# Patient Record
Sex: Female | Born: 1935 | Race: White | Hispanic: No | Marital: Single | Smoking: Never smoker
Health system: Southern US, Community
[De-identification: ages and names within clinical notes are randomized; demographics above are authoritative.]

## PROBLEM LIST (undated history)

## (undated) DIAGNOSIS — E039 Hypothyroidism, unspecified: Secondary | ICD-10-CM

## (undated) DIAGNOSIS — G8929 Other chronic pain: Secondary | ICD-10-CM

## (undated) DIAGNOSIS — E119 Type 2 diabetes mellitus without complications: Secondary | ICD-10-CM

## (undated) DIAGNOSIS — M1990 Unspecified osteoarthritis, unspecified site: Secondary | ICD-10-CM

## (undated) DIAGNOSIS — R609 Edema, unspecified: Secondary | ICD-10-CM

## (undated) DIAGNOSIS — Z8619 Personal history of other infectious and parasitic diseases: Secondary | ICD-10-CM

## (undated) DIAGNOSIS — R350 Frequency of micturition: Secondary | ICD-10-CM

## (undated) DIAGNOSIS — M329 Systemic lupus erythematosus, unspecified: Secondary | ICD-10-CM

## (undated) DIAGNOSIS — Z8709 Personal history of other diseases of the respiratory system: Secondary | ICD-10-CM

## (undated) DIAGNOSIS — R351 Nocturia: Secondary | ICD-10-CM

## (undated) DIAGNOSIS — IMO0002 Reserved for concepts with insufficient information to code with codable children: Secondary | ICD-10-CM

## (undated) DIAGNOSIS — E785 Hyperlipidemia, unspecified: Secondary | ICD-10-CM

## (undated) DIAGNOSIS — H269 Unspecified cataract: Secondary | ICD-10-CM

## (undated) DIAGNOSIS — M2550 Pain in unspecified joint: Secondary | ICD-10-CM

## (undated) DIAGNOSIS — K219 Gastro-esophageal reflux disease without esophagitis: Secondary | ICD-10-CM

## (undated) DIAGNOSIS — K5900 Constipation, unspecified: Secondary | ICD-10-CM

## (undated) DIAGNOSIS — R600 Localized edema: Secondary | ICD-10-CM

## (undated) DIAGNOSIS — N390 Urinary tract infection, site not specified: Secondary | ICD-10-CM

## (undated) DIAGNOSIS — I10 Essential (primary) hypertension: Secondary | ICD-10-CM

## (undated) DIAGNOSIS — M549 Dorsalgia, unspecified: Secondary | ICD-10-CM

## (undated) HISTORY — PX: OTHER SURGICAL HISTORY: SHX169

## (undated) HISTORY — PX: ADENOIDECTOMY: SUR15

## (undated) HISTORY — PX: TONSILLECTOMY: SUR1361

## (undated) HISTORY — PX: CHOLECYSTECTOMY: SHX55

## (undated) HISTORY — PX: EYE SURGERY: SHX253

---

## 2014-04-07 ENCOUNTER — Encounter (HOSPITAL_COMMUNITY): Payer: Self-pay | Admitting: Emergency Medicine

## 2014-04-07 ENCOUNTER — Emergency Department (HOSPITAL_COMMUNITY): Payer: Worker's Compensation

## 2014-04-07 ENCOUNTER — Inpatient Hospital Stay (HOSPITAL_COMMUNITY)
Admission: EM | Admit: 2014-04-07 | Discharge: 2014-04-10 | DRG: 469 | Disposition: A | Discharge status: Rehabilitation Facility | Payer: Worker's Compensation | Attending: Internal Medicine | Admitting: Internal Medicine

## 2014-04-07 DIAGNOSIS — S72009A Fracture of unspecified part of neck of unspecified femur, initial encounter for closed fracture: Secondary | ICD-10-CM

## 2014-04-07 DIAGNOSIS — W010XXA Fall on same level from slipping, tripping and stumbling without subsequent striking against object, initial encounter: Secondary | ICD-10-CM | POA: Diagnosis present

## 2014-04-07 DIAGNOSIS — B965 Pseudomonas (aeruginosa) (mallei) (pseudomallei) as the cause of diseases classified elsewhere: Secondary | ICD-10-CM | POA: Diagnosis present

## 2014-04-07 DIAGNOSIS — S72033A Displaced midcervical fracture of unspecified femur, initial encounter for closed fracture: Principal | ICD-10-CM | POA: Diagnosis present

## 2014-04-07 DIAGNOSIS — I10 Essential (primary) hypertension: Secondary | ICD-10-CM

## 2014-04-07 DIAGNOSIS — D62 Acute posthemorrhagic anemia: Secondary | ICD-10-CM | POA: Diagnosis not present

## 2014-04-07 DIAGNOSIS — M1990 Unspecified osteoarthritis, unspecified site: Secondary | ICD-10-CM

## 2014-04-07 DIAGNOSIS — N390 Urinary tract infection, site not specified: Secondary | ICD-10-CM | POA: Diagnosis present

## 2014-04-07 DIAGNOSIS — E876 Hypokalemia: Secondary | ICD-10-CM | POA: Diagnosis not present

## 2014-04-07 DIAGNOSIS — E860 Dehydration: Secondary | ICD-10-CM | POA: Diagnosis present

## 2014-04-07 DIAGNOSIS — Z79899 Other long term (current) drug therapy: Secondary | ICD-10-CM

## 2014-04-07 DIAGNOSIS — K5900 Constipation, unspecified: Secondary | ICD-10-CM | POA: Diagnosis present

## 2014-04-07 DIAGNOSIS — E119 Type 2 diabetes mellitus without complications: Secondary | ICD-10-CM | POA: Diagnosis present

## 2014-04-07 DIAGNOSIS — E039 Hypothyroidism, unspecified: Secondary | ICD-10-CM

## 2014-04-07 DIAGNOSIS — J9600 Acute respiratory failure, unspecified whether with hypoxia or hypercapnia: Secondary | ICD-10-CM | POA: Diagnosis not present

## 2014-04-07 DIAGNOSIS — E785 Hyperlipidemia, unspecified: Secondary | ICD-10-CM

## 2014-04-07 DIAGNOSIS — E1149 Type 2 diabetes mellitus with other diabetic neurological complication: Secondary | ICD-10-CM | POA: Diagnosis present

## 2014-04-07 DIAGNOSIS — Z794 Long term (current) use of insulin: Secondary | ICD-10-CM

## 2014-04-07 DIAGNOSIS — E1142 Type 2 diabetes mellitus with diabetic polyneuropathy: Secondary | ICD-10-CM | POA: Diagnosis present

## 2014-04-07 DIAGNOSIS — S72002A Fracture of unspecified part of neck of left femur, initial encounter for closed fracture: Secondary | ICD-10-CM

## 2014-04-07 HISTORY — DX: Hypothyroidism, unspecified: E03.9

## 2014-04-07 HISTORY — DX: Essential (primary) hypertension: I10

## 2014-04-07 HISTORY — DX: Unspecified osteoarthritis, unspecified site: M19.90

## 2014-04-07 HISTORY — DX: Hyperlipidemia, unspecified: E78.5

## 2014-04-07 HISTORY — DX: Type 2 diabetes mellitus without complications: E11.9

## 2014-04-07 LAB — CBC WITH DIFFERENTIAL/PLATELET
BASOS ABS: 0 10*3/uL (ref 0.0–0.1)
BASOS PCT: 0 % (ref 0–1)
Eosinophils Absolute: 0.2 10*3/uL (ref 0.0–0.7)
Eosinophils Relative: 2 % (ref 0–5)
HCT: 36.6 % (ref 36.0–46.0)
Hemoglobin: 12.7 g/dL (ref 12.0–15.0)
Lymphocytes Relative: 13 % (ref 12–46)
Lymphs Abs: 1.3 10*3/uL (ref 0.7–4.0)
MCH: 32.9 pg (ref 26.0–34.0)
MCHC: 34.7 g/dL (ref 30.0–36.0)
MCV: 94.8 fL (ref 78.0–100.0)
MONO ABS: 0.3 10*3/uL (ref 0.1–1.0)
Monocytes Relative: 3 % (ref 3–12)
Neutro Abs: 8.2 10*3/uL — ABNORMAL HIGH (ref 1.7–7.7)
Neutrophils Relative %: 82 % — ABNORMAL HIGH (ref 43–77)
Platelets: 180 10*3/uL (ref 150–400)
RBC: 3.86 MIL/uL — ABNORMAL LOW (ref 3.87–5.11)
RDW: 13.8 % (ref 11.5–15.5)
WBC: 10 10*3/uL (ref 4.0–10.5)

## 2014-04-07 LAB — COMPREHENSIVE METABOLIC PANEL WITH GFR
ALT: <5 U/L (ref 0–35)
AST: 12 U/L (ref 0–37)
Albumin: 3.3 g/dL — ABNORMAL LOW (ref 3.5–5.2)
Alkaline Phosphatase: 87 U/L (ref 39–117)
BUN: 22 mg/dL (ref 6–23)
CO2: 22 meq/L (ref 19–32)
Calcium: 8.9 mg/dL (ref 8.4–10.5)
Chloride: 107 meq/L (ref 96–112)
Creatinine, Ser: 0.84 mg/dL (ref 0.50–1.10)
GFR calc Af Amer: 71 mL/min — ABNORMAL LOW (ref >90)
GFR calc non Af Amer: 61 mL/min — ABNORMAL LOW (ref >90)
Glucose, Bld: 141 mg/dL — ABNORMAL HIGH (ref 70–99)
Potassium: 4.1 meq/L (ref 3.7–5.3)
Sodium: 142 meq/L (ref 137–147)
Total Bilirubin: 0.3 mg/dL (ref 0.3–1.2)
Total Protein: 6.8 g/dL (ref 6.0–8.3)

## 2014-04-07 LAB — URINALYSIS, ROUTINE W REFLEX MICROSCOPIC
Bilirubin Urine: NEGATIVE
Glucose, UA: NEGATIVE mg/dL
Ketones, ur: NEGATIVE mg/dL
Nitrite: NEGATIVE
Protein, ur: 30 mg/dL — AB
Specific Gravity, Urine: 1.013 (ref 1.005–1.030)
Urobilinogen, UA: 0.2 mg/dL (ref 0.0–1.0)
pH: 5.5 (ref 5.0–8.0)

## 2014-04-07 LAB — GLUCOSE, CAPILLARY
Glucose-Capillary: 129 mg/dL — ABNORMAL HIGH (ref 70–99)
Glucose-Capillary: 119 mg/dL — ABNORMAL HIGH (ref 70–99)
Glucose-Capillary: 123 mg/dL — ABNORMAL HIGH (ref 70–99)

## 2014-04-07 LAB — TSH: TSH: 4.4 u[IU]/mL (ref 0.350–4.500)

## 2014-04-07 LAB — PROTIME-INR
INR: 1.06 (ref 0.00–1.49)
Prothrombin Time: 13.6 seconds (ref 11.6–15.2)

## 2014-04-07 LAB — APTT: aPTT: 30 s (ref 24–37)

## 2014-04-07 LAB — TYPE AND SCREEN
ABO/RH(D): O POS
Antibody Screen: NEGATIVE

## 2014-04-07 LAB — HEMOGLOBIN A1C
Hgb A1c MFr Bld: 6.8 % — ABNORMAL HIGH (ref <5.7)
Mean Plasma Glucose: 148 mg/dL — ABNORMAL HIGH (ref <117)

## 2014-04-07 LAB — URINE MICROSCOPIC-ADD ON

## 2014-04-07 LAB — ABO/RH: ABO/RH(D): O POS

## 2014-04-07 MED ORDER — SIMVASTATIN 20 MG PO TABS: 20.0000 mg | ORAL_TABLET | Freq: Every day | ORAL | Status: DC

## 2014-04-07 MED ORDER — LISINOPRIL 40 MG PO TABS
40.0000 mg | ORAL_TABLET | Freq: Every day | ORAL | Status: DC
  Administered 2014-04-08 – 2014-04-10 (×3): 40 mg via ORAL
  Filled 2014-04-07 (×3): qty 1

## 2014-04-07 MED ORDER — OXYCODONE-ACETAMINOPHEN 5-325 MG PO TABS
1.0000 | ORAL_TABLET | Freq: Once | ORAL | Status: AC
  Administered 2014-04-07: 1 via ORAL
  Filled 2014-04-07: qty 1

## 2014-04-07 MED ORDER — SORBITOL 70 % SOLN: 30.0000 mL | Freq: Every day | Status: DC | PRN

## 2014-04-07 MED ORDER — SODIUM CHLORIDE 0.9 % IV BOLUS (SEPSIS)
500.0000 mL | Freq: Once | INTRAVENOUS | Status: AC
  Administered 2014-04-07: 500 mL via INTRAVENOUS

## 2014-04-07 MED ORDER — CEFAZOLIN SODIUM-DEXTROSE 2-3 GM-% IV SOLR
2.0000 g | INTRAVENOUS | Status: AC
  Administered 2014-04-08: 2 g via INTRAVENOUS
  Filled 2014-04-07: qty 50

## 2014-04-07 MED ORDER — GEMFIBROZIL 600 MG PO TABS
600.0000 mg | ORAL_TABLET | Freq: Two times a day (BID) | ORAL | Status: DC
  Administered 2014-04-07 – 2014-04-10 (×4): 600 mg via ORAL
  Filled 2014-04-07 (×8): qty 1

## 2014-04-07 MED ORDER — INSULIN ASPART 100 UNIT/ML ~~LOC~~ SOLN
0.0000 [IU] | Freq: Three times a day (TID) | SUBCUTANEOUS | Status: DC
  Administered 2014-04-07 – 2014-04-09 (×3): 2 [IU] via SUBCUTANEOUS
  Administered 2014-04-10: 3 [IU] via SUBCUTANEOUS
  Administered 2014-04-10: 2 [IU] via SUBCUTANEOUS

## 2014-04-07 MED ORDER — DOCUSATE SODIUM 100 MG PO CAPS
100.0000 mg | ORAL_CAPSULE | Freq: Two times a day (BID) | ORAL | Status: DC
  Administered 2014-04-08 – 2014-04-10 (×3): 100 mg via ORAL
  Filled 2014-04-07 (×7): qty 1

## 2014-04-07 MED ORDER — ATENOLOL 100 MG PO TABS
100.0000 mg | ORAL_TABLET | Freq: Every day | ORAL | Status: DC
  Administered 2014-04-08 – 2014-04-10 (×3): 100 mg via ORAL
  Filled 2014-04-07 (×3): qty 1

## 2014-04-07 MED ORDER — SODIUM CHLORIDE 0.9 % IV SOLN
INTRAVENOUS | Status: DC
  Filled 2014-04-07: qty 1000

## 2014-04-07 MED ORDER — MAGNESIUM CITRATE PO SOLN: 1.0000 | Freq: Once | ORAL | Status: AC | PRN

## 2014-04-07 MED ORDER — POLYETHYLENE GLYCOL 3350 17 G PO PACK: 17.0000 g | PACK | Freq: Every day | ORAL | Status: DC | PRN

## 2014-04-07 MED ORDER — HYDROCODONE-ACETAMINOPHEN 5-325 MG PO TABS
1.0000 | ORAL_TABLET | Freq: Four times a day (QID) | ORAL | Status: DC | PRN
  Administered 2014-04-07 – 2014-04-08 (×4): 2 via ORAL
  Administered 2014-04-09 – 2014-04-10 (×6): 1 via ORAL
  Filled 2014-04-07 (×2): qty 2
  Filled 2014-04-07 (×4): qty 1
  Filled 2014-04-07: qty 2
  Filled 2014-04-07 (×4): qty 1
  Filled 2014-04-07: qty 2

## 2014-04-07 MED ORDER — PANTOPRAZOLE SODIUM 40 MG PO TBEC
40.0000 mg | DELAYED_RELEASE_TABLET | Freq: Every day | ORAL | Status: DC
  Administered 2014-04-08 – 2014-04-10 (×3): 40 mg via ORAL
  Filled 2014-04-07 (×3): qty 1

## 2014-04-07 MED ORDER — SODIUM CHLORIDE 0.9 % IV SOLN
INTRAVENOUS | Status: DC
  Administered 2014-04-08 (×3): via INTRAVENOUS

## 2014-04-07 MED ORDER — MORPHINE SULFATE 2 MG/ML IJ SOLN: 0.5000 mg | INTRAMUSCULAR | Status: DC | PRN

## 2014-04-07 MED ORDER — ATORVASTATIN CALCIUM 10 MG PO TABS
10.0000 mg | ORAL_TABLET | Freq: Every day | ORAL | Status: DC
  Administered 2014-04-09: 10 mg via ORAL
  Filled 2014-04-07 (×3): qty 1

## 2014-04-07 MED ORDER — ACETAMINOPHEN 500 MG PO TABS
1000.0000 mg | ORAL_TABLET | Freq: Once | ORAL | Status: AC
  Administered 2014-04-08: 1000 mg via ORAL
  Filled 2014-04-07: qty 2

## 2014-04-07 MED ORDER — INSULIN GLARGINE 100 UNIT/ML ~~LOC~~ SOLN
15.0000 [IU] | Freq: Every day | SUBCUTANEOUS | Status: DC
  Administered 2014-04-08 – 2014-04-09 (×2): 15 [IU] via SUBCUTANEOUS
  Filled 2014-04-07 (×4): qty 0.15

## 2014-04-07 MED ORDER — AMLODIPINE BESYLATE 10 MG PO TABS
10.0000 mg | ORAL_TABLET | Freq: Every day | ORAL | Status: DC
  Administered 2014-04-08 – 2014-04-10 (×3): 10 mg via ORAL
  Filled 2014-04-07 (×3): qty 1

## 2014-04-07 MED ORDER — DEXTROSE 5 % IV SOLN
1.0000 g | Freq: Once | INTRAVENOUS | Status: AC
  Administered 2014-04-07: 1 g via INTRAVENOUS
  Filled 2014-04-07: qty 10

## 2014-04-07 MED ORDER — LEVOTHYROXINE SODIUM 125 MCG PO TABS
125.0000 ug | ORAL_TABLET | Freq: Every day | ORAL | Status: DC
  Administered 2014-04-08 – 2014-04-10 (×3): 125 ug via ORAL
  Filled 2014-04-07 (×4): qty 1

## 2014-04-07 MED ORDER — CEFTRIAXONE SODIUM 1 G IJ SOLR
1.0000 g | INTRAMUSCULAR | Status: DC
  Administered 2014-04-07 – 2014-04-09 (×2): 1 g via INTRAVENOUS
  Filled 2014-04-07 (×5): qty 10

## 2014-04-07 NOTE — ED Notes (Signed)
Pt walking down hall, lost balance and fell. No LOC and did not hit head. L hip and leg pain. No trauma or deformities. BP 240/100 manual, CBG 177, (Pt hx of HTN and DM. ) SR on monitor. NO chest pain.

## 2014-04-07 NOTE — Consult Note (Signed)
Reason for Consult:  Left hip pain Referring Physician:  Dr. Hall [name] is an 80 y.o. female.  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  She slipped while walking and landed on her left side.  She denies any LOC, dizziness or vertigo.  She is diabetic and hypothyroid but has no h/o smoking.  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  She c/o aching pain in her left groin that becomes sharp and severe with any attempt at motion.  No h/o surgery to the left hip.  No blood thinners.  Past Medical History  Diagnosis Date  . Hypertension   . Diabetes mellitus without complication   . HTN (hypertension) 04/07/2014  . Hyperlipidemia 04/07/2014  . Hypothyroidism 04/07/2014  . OA (osteoarthritis) 04/07/2014    Past Surgical History  Procedure Laterality Date  . Cholecystectomy      History reviewed. No pertinent family history.  Social History:  reports that she has never smoked. She does not have any smokeless tobacco history on file. She reports that she does not drink alcohol or use illicit drugs.  Allergies: No Known Allergies  Medications: I have reviewed the patient's current medications.  Results for orders placed during the hospital encounter of 04/07/14 (from the past 48 hour(s))  URINALYSIS, ROUTINE W REFLEX MICROSCOPIC     Status: Abnormal   Collection Time    04/07/14 10:52 AM      Result Value Ref Range   Color, Urine YELLOW  YELLOW   APPearance CLEAR  CLEAR   Specific Gravity, Urine 1.013  1.005 - 1.030   pH 5.5  5.0 - 8.0   Glucose, UA NEGATIVE  NEGATIVE mg/dL   Hgb urine dipstick SMALL (*) NEGATIVE   Bilirubin Urine NEGATIVE  NEGATIVE   Ketones, ur NEGATIVE  NEGATIVE mg/dL   Protein, ur 30 (*) NEGATIVE mg/dL   Urobilinogen, UA 0.2  0.0 - 1.0 mg/dL   Nitrite NEGATIVE  NEGATIVE   Leukocytes, UA SMALL (*) NEGATIVE  URINE MICROSCOPIC-ADD ON     Status: Abnormal   Collection Time    04/07/14 10:52 AM       Result Value Ref Range   Squamous Epithelial / LPF RARE  RARE   WBC, UA 21-50  <3 WBC/hpf   RBC / HPF 0-2  <3 RBC/hpf   Bacteria, UA MANY (*) RARE  APTT     Status: None   Collection Time    04/07/14 11:10 AM      Result Value Ref Range   aPTT 30  24 - 37 seconds  CBC WITH DIFFERENTIAL     Status: Abnormal   Collection Time    04/07/14 11:10 AM      Result Value Ref Range   WBC 10.0  4.0 - 10.5 K/uL   RBC 3.86 (*) 3.87 - 5.11 MIL/uL   Hemoglobin 12.7  12.0 - 15.0 g/dL   HCT 36.6  36.0 - 46.0 %   MCV 94.8  78.0 - 100.0 fL   MCH 32.9  26.0 - 34.0 pg   MCHC 34.7  30.0 - 36.0 g/dL   RDW 13.8  11.5 - 15.5 %   Platelets 180  150 - 400 K/uL   Neutrophils Relative % 82 (*) 43 - 77 %   Neutro Abs 8.2 (*) 1.7 - 7.7 K/uL   Lymphocytes Relative 13  12 - 46 %   Lymphs Abs 1.3  0.7 - 4.0 K/uL   Monocytes Relative 3  3 - 12 %   Monocytes Absolute 0.3  0.1 - 1.0 K/uL   Eosinophils Relative 2  0 - 5 %   Eosinophils Absolute 0.2  0.0 - 0.7 K/uL   Basophils Relative 0  0 - 1 %   Basophils Absolute 0.0  0.0 - 0.1 K/uL  COMPREHENSIVE METABOLIC PANEL     Status: Abnormal   Collection Time    04/07/14 11:10 AM      Result Value Ref Range   Sodium 142  137 - 147 mEq/L   Potassium 4.1  3.7 - 5.3 mEq/L   Chloride 107  96 - 112 mEq/L   CO2 22  19 - 32 mEq/L   Glucose, Bld 141 (*) 70 - 99 mg/dL   BUN 22  6 - 23 mg/dL   Creatinine, Ser 0.84  0.50 - 1.10 mg/dL   Calcium 8.9  8.4 - 10.5 mg/dL   Total Protein 6.8  6.0 - 8.3 g/dL   Albumin 3.3 (*) 3.5 - 5.2 g/dL   AST 12  0 - 37 U/L   ALT <5  0 - 35 U/L   Alkaline Phosphatase 87  39 - 117 U/L   Total Bilirubin 0.3  0.3 - 1.2 mg/dL   GFR calc non Af Amer 61 (*) >90 mL/min   GFR calc Af Amer 71 (*) >90 mL/min   Comment: (NOTE)     The eGFR has been calculated using the CKD EPI equation.     This calculation has not been validated in all clinical situations.     eGFR's persistently <90 mL/min signify possible Chronic Kidney     Disease.   PROTIME-INR     Status: None   Collection Time    04/07/14 11:10 AM      Result Value Ref Range   Prothrombin Time 13.6  11.6 - 15.2 seconds   INR 1.06  0.00 - 1.49  TYPE AND SCREEN     Status: None   Collection Time    04/07/14 11:10 AM      Result Value Ref Range   ABO/RH(D) O POS     Antibody Screen NEG     Sample Expiration 04/10/2014    ABO/RH     Status: None   Collection Time    04/07/14 11:10 AM      Result Value Ref Range   ABO/RH(D) O POS    GLUCOSE, CAPILLARY     Status: Abnormal   Collection Time    04/07/14  4:03 PM      Result Value Ref Range   Glucose-Capillary 129 (*) 70 - 99 mg/dL   Comment 1 Documented in Chart     Comment 2 Notify RN      Dg Hip Complete Left  04/07/2014   CLINICAL DATA:  Fall, left hip pain  EXAM: LEFT HIP - COMPLETE 2+ VIEW  COMPARISON:  None.  FINDINGS: There is a displaced subcapital fracture of the left femoral neck with cephalad migration of the distal fracture fragment. Hip is located.  IMPRESSION: Displaced femoral neck fracture.   Electronically Signed   By: April [name] M.D.   On: 04/07/2014 10:19   Dg Chest Port 1 View  04/07/2014   CLINICAL DATA:  Preoperative evaluation; recent trauma ; hypertension  EXAM: PORTABLE CHEST - 1 VIEW  COMPARISON:  None.  FINDINGS: There is no edema or consolidation. There is minimal scarring in the  left base. The heart is upper normal in size with normal pulmonary vascularity. No adenopathy. There is atherosclerotic change in the aorta. There is arthropathy with chronic rotator cuff tears in both shoulders. There is an azygos lobe on the right, an anatomic variant. No pneumothorax.  IMPRESSION: No edema or consolidation.  Minimal left base scarring.   Electronically Signed   By: April [name] M.D.   On: 04/07/2014 11:42    ROS:  No recent f/c/n/v/wt loss PE:  Blood pressure 129/60, pulse 67, temperature 98.1 F (36.7 C), temperature source Oral, resp. rate 18, height _0  (1.727 m), weight  81.647 kg (180 lb), SpO2 99.00%. wn wd woman in nad.  A and O x 4.  Mood and affect normal.  EOMi.  Resp unlabored.  L hip with healthy and intact skin.  No lymphadenopathy of the left LE.  5/5 strength in PF adn DF of the L toes and ankle.  Sens to LT intact at the left foot.  L LE slightly shortened.  Assessment/Plan: Displaced left femoral neck fracture - I explained the nature of the injury to the patient in detail.  Dr. Veverly Fells has agreed to perform the patient's surgery tomorrow.  She understands the risks and benefits of the alternative treatment options and would like to procee dwith surgery.  NPO after clear liquid breakfast in the morning.  April [name] 04/07/2014, 7:02 PM

## 2014-04-07 NOTE — H&P (Signed)
[facility] History and Physical  April [name] ZOX:[id number] DOB: [birth year] DOA: 04/07/2014  Referring physician: Dr Rosalia Hammers PCP: Juliette Alcide, MD   Chief Complaint: Fall  HPI: April [name] is a 80 y.o. female  With history of hypertension, diabetes, hyperlipidemia, hypothyroidism, osteoarthritis who was at work other [employer] when she was walking fast in the hallway to go see the new director when she slipped on a slippery floor and fell on her left side results in a lot of pain. Patient was unable to get up without the help 03 coworkers EMS was subsequently called and patient brought to the ED. Patient denies any syncope, no chest pain, no shortness of breath, no fever, no chills, no nausea, no vomiting, no abdominal pain, no constipation, no weakness, no cough. Patient does endorse some soft stool as well as some dysuria a week ago. Patient was seen in the emergency room chest x-ray which was done was unremarkable. Plain films of the left hip showed a displaced left femoral neck fracture. Comprehensive metabolic profile was unremarkable. CBC was unremarkable. INR was 1.06. Urinalysis done was nitrite negative small leukocytes 21-50 WBCs many bacteria. We were called to admit the patient for further evaluation and management. ED physician stated that Dr. Victorino Dike of orthopedics was called and will be consulted on the patient for possible surgery tomorrow.   Review of Systems: As per history of present illness otherwise negative. Constitutional:  No weight loss, night sweats, Fevers, chills, fatigue.  HEENT:  No headaches, Difficulty swallowing,Tooth/dental problems,Sore throat,  No sneezing, itching, ear ache, nasal congestion, post nasal drip,  Cardio-vascular:  No chest pain, Orthopnea, PND, swelling in lower extremities, anasarca, dizziness, palpitations  GI:  No heartburn, indigestion, abdominal pain, nausea, vomiting, diarrhea, change in bowel  habits, loss of appetite  Resp:  No shortness of breath with exertion or at rest. No excess mucus, no productive cough, No non-productive cough, No coughing up of blood.No change in color of mucus.No wheezing.No chest wall deformity  Skin:  no rash or lesions.  GU:  no dysuria, change in color of urine, no urgency or frequency. No flank pain.  Musculoskeletal:  No joint pain or swelling. No decreased range of motion. No back pain.  Psych:  No change in mood or affect. No depression or anxiety. No memory loss.   Past Medical History  Diagnosis Date  . Hypertension   . Diabetes mellitus without complication   . HTN (hypertension) 04/07/2014  . Hyperlipidemia 04/07/2014  . Hypothyroidism 04/07/2014  . OA (osteoarthritis) 04/07/2014   Past Surgical History  Procedure Laterality Date  . Cholecystectomy     Social History:  reports that she has never smoked. She does not have any smokeless tobacco history on file. She reports that she does not drink alcohol or use illicit drugs.  No Known Allergies  History reviewed. No pertinent family history.   Prior to Admission medications   Medication Sig Start Date End Date Taking? Authorizing Provider  amLODipine (NORVASC) 10 MG tablet Take 10 mg by mouth daily.   Yes Historical Provider, MD  atenolol (TENORMIN) 100 MG tablet Take 100 mg by mouth daily.   Yes Historical Provider, MD  gemfibrozil (LOPID) 600 MG tablet Take 600 mg by mouth 2 (two) times daily before a meal.   Yes Historical Provider, MD  ibuprofen (ADVIL,MOTRIN) 200 MG tablet Take 200-400 mg by mouth See admin instructions. Take 400 mg in the morning and 200 mg at bedtime.  Yes Historical Provider, MD  insulin glargine (LANTUS) 100 UNIT/ML injection Inject 35-45 Units into the skin at bedtime.   Yes Historical Provider, MD  levothyroxine (SYNTHROID, LEVOTHROID) 125 MCG tablet Take 125 mcg by mouth daily before breakfast.   Yes Historical Provider, MD  lisinopril (PRINIVIL,ZESTRIL)  40 MG tablet Take 40 mg by mouth daily.   Yes Historical Provider, MD  lovastatin (MEVACOR) 40 MG tablet Take 40 mg by mouth daily.   Yes Historical Provider, MD   Physical Exam: Filed Vitals:   04/07/14 1400  BP: 146/62  Pulse: 65  Temp:   Resp: 22    BP 146/62  Pulse 65  Temp(Src) 98.1 F (36.7 C) (Oral)  Resp 22  Ht 5\' 8"  (1.727 m)  Wt 81.647 kg (180 lb)  BMI 27.38 kg/m2  SpO2 96%  General:  Elderly female laying on the gurney in no acute cardiopulmonary distress. Eyes: PERRLA, EOMI, normal lids, irises & conjunctiva ENT: grossly normal hearing, lips & tongue Neck: no LAD, masses or thyromegaly Cardiovascular: RRR, no m/r/g. No LE edema. Respiratory: CTA bilaterally, no w/r/r. Normal respiratory effort. Abdomen: soft, ntnd, positive bowel sounds, no rebound, no guarding. Skin: no rash or induration seen on limited exam Musculoskeletal: grossly normal tone BUE/BLE. Left lower extremity with tenderness to palpation in the hip area and slightly shortened with some external rotation. Unable to lift left lower extremity off the bed. Psychiatric: grossly normal mood and affect, speech fluent and appropriate Neurologic: Alert with x3. Cranial nerves II through XII are grossly intact. No focal deficits. Sensation is intact. Visual fields are intact. Gait not tested secondary to safety.           Labs on Admission:  Basic Metabolic Panel:  Recent Labs Lab 04/07/14 1110  NA 142  K 4.1  CL 107  CO2 22  GLUCOSE 141*  BUN 22  CREATININE 0.84  CALCIUM 8.9   Liver Function Tests:  Recent Labs Lab 04/07/14 1110  AST 12  ALT <5  ALKPHOS 87  BILITOT 0.3  PROT 6.8  ALBUMIN 3.3*   No results found for this basename: LIPASE, AMYLASE,  in the last 168 hours No results found for this basename: AMMONIA,  in the last 168 hours CBC:  Recent Labs Lab 04/07/14 1110  WBC 10.0  NEUTROABS 8.2*  HGB 12.7  HCT 36.6  MCV 94.8  PLT 180   Cardiac Enzymes: No results found  for this basename: CKTOTAL, CKMB, CKMBINDEX, TROPONINI,  in the last 168 hours  BNP (last 3 results) No results found for this basename: PROBNP,  in the last 8760 hours CBG: No results found for this basename: GLUCAP,  in the last 168 hours  Radiological Exams on Admission: Dg Hip Complete Left  04/07/2014   CLINICAL DATA:  Fall, left hip pain  EXAM: LEFT HIP - COMPLETE 2+ VIEW  COMPARISON:  None.  FINDINGS: There is a displaced subcapital fracture of the left femoral neck with cephalad migration of the distal fracture fragment. Hip is located.  IMPRESSION: Displaced femoral neck fracture.   Electronically Signed   By: Genevive BiStewart  Edmunds M.D.   On: 04/07/2014 10:19   Dg Chest Port 1 View  04/07/2014   CLINICAL DATA:  Preoperative evaluation; recent trauma ; hypertension  EXAM: PORTABLE CHEST - 1 VIEW  COMPARISON:  None.  FINDINGS: There is no edema or consolidation. There is minimal scarring in the left base. The heart is upper normal in size with normal pulmonary vascularity.  No adenopathy. There is atherosclerotic change in the aorta. There is arthropathy with chronic rotator cuff tears in both shoulders. There is an azygos lobe on the right, an anatomic variant. No pneumothorax.  IMPRESSION: No edema or consolidation.  Minimal left base scarring.   Electronically Signed   By: Bretta BangWilliam  Woodruff M.D.   On: 04/07/2014 11:42    EKG: Independently reviewed. Normal sinus rhythm with poor R-wave progression.  Assessment/Plan Principal Problem:   Left displaced femoral neck fracture Active Problems:   HTN (hypertension)   Hyperlipidemia   DM (diabetes mellitus)   Hypothyroidism   OA (osteoarthritis)   UTI (lower urinary tract infection)   Dehydration  #1 left displaced femoral neck fracture Secondary to mechanical fall. Patient denies any chest pain or syncopal episodes. Patient with no cardiac history. Will admit the patient to a MedSurg floor. Orthopedics has been consulted PE deep physician  who spoke with Dr. Victorino DikeHewitt will be consulted on the patient for possible surgery tomorrow. Patient does not have a cardiac history. Patient denies any recent chest pain. EKG shows normal sinus rhythm with no ischemic changes. Patient with comorbidities of hypertension, hyperlipidemia, diabetes and due to her age is moderate risk for surgery however should be okay for surgery tomorrow. Will keep patient n.p.o. after midnight. Orthopedic consultation pending.  #2 hypertension Stable. Continue home regimen of atenolol, lisinopril.  #3 diabetes mellitus We'll place on half home dose of Lantus as patient is to have surgery tomorrow and be n.p.o. after midnight. Place on a sliding scale insulin.  #4 hypothyroidism Check a TSH. Continue home dose Synthroid.  #5 hyperlipidemia Continue statin.  #6 urinary tract infection Urine cultures are pending. Will place on IV Rocephin.  #7 dehydration IV fluids.  #8 prophylaxis PPI for GI prophylaxis. SCDs for DVT prophylaxis.   Code Status: Full Family Communication: Updated patient's daughter and granddaughter at bedside. Disposition Plan: Admit to MedSurg floor.  Time spent: 65 mins  Rodolph Bonganiel V Youssef Footman MD [facility] Pager [phone]

## 2014-04-07 NOTE — ED Notes (Signed)
Report called Museum/gallery conservatorBecky RN. Pt will be moved to Pod C

## 2014-04-07 NOTE — Progress Notes (Signed)
Utilization review completed.  

## 2014-04-07 NOTE — ED Provider Notes (Signed)
80  Y.o. Female with mechanical fall resulting in left hip fracture.  Patient seen and evaluated and appears hemodynamically stable.  Resident physician discussed with ortho.  They plan repair in am.  I discussed with Dr. Janee Mornhompson.   I performed a history and physical examination of April [name] and discussed her management with Dr. Swaziland[place].  I agree with the history, physical, assessment, and plan of care, with the following exceptions: None  I was present for the following procedures: None Time Spent in Critical Care of the patient: None Time spent in discussions with the patient and family: 10  Terianne Thaker S Tennyson Wacha    April Quarryanielle S Emalene Welte, MD 04/07/14 [phone]

## 2014-04-07 NOTE — ED Provider Notes (Signed)
CSN: [id number]     Arrival date & time 04/07/14  [id number] History   None    Chief Complaint  Patient presents with  . Fall  . Hip Pain     (Consider location/radiation/quality/duration/timing/severity/associated sxs/prior Treatment) HPI 80 year old woman with history of HTN, DM2, hypothyroidism who presents with left hip pain after fall.   Patient states she was walking down hall this morning, lost her balance, and fell landing on her left hip and left.  Did not lose consciousness or hit her head.  Having left hip and leg pain when she tries to move left leg.  Denies numbness or tingling.   Past Medical History  Diagnosis Date  . Hypertension   . Diabetes mellitus without complication    Past Surgical History  Procedure Laterality Date  . Cholecystectomy     History reviewed. No pertinent family history. History  Substance Use Topics  . Smoking status: Never Smoker   . Smokeless tobacco: Not on file  . Alcohol Use: No   OB History   Grav Para Term Preterm Abortions TAB SAB Ect Mult Living                 Review of Systems  Constitutional: Negative for fever.  Respiratory: Negative for cough and shortness of breath.   Cardiovascular: Negative for chest pain and leg swelling.  Gastrointestinal: Negative for nausea and abdominal pain.  Genitourinary: Negative for dysuria.  Musculoskeletal: Negative for back pain.  Neurological: Negative for dizziness, syncope and weakness.     Allergies  Review of patient's allergies indicates no known allergies.  Home Medications   Prior to Admission medications   Not on File   BP 139/60  Pulse 71  Temp(Src) 98.1 F (36.7 C) (Oral)  Resp 18  Ht 5\' 8"  (1.727 m)  Wt 180 lb (81.647 kg)  BMI 27.38 kg/m2  SpO2 95% Physical Exam  Constitutional: She is oriented to person, place, and time. She appears well-developed and well-nourished. No distress.  HENT:  Head: Normocephalic and atraumatic.  Eyes: Conjunctivae and EOM are  normal. Pupils are equal, round, and reactive to light.  Neck: Normal range of motion. Neck supple.  Cardiovascular: Normal rate and regular rhythm.   Pulmonary/Chest: Effort normal and breath sounds normal.  Abdominal: Soft. Bowel sounds are normal.  Musculoskeletal: She exhibits tenderness.  Left lateral hip TTP. Patient unable to lift left leg off bed.   Neurological: She is alert and oriented to person, place, and time. No cranial nerve deficit.  Decreased strength in left lower extremity 2/2 pain. Neurovascularly intact.   Skin: Skin is warm and dry.    ED Course  Procedures (including critical care time) Labs Review Labs Reviewed  CBC WITH DIFFERENTIAL - Abnormal; Notable for the following:    RBC 3.86 (*)    Neutrophils Relative % 82 (*)    Neutro Abs 8.2 (*)    All other components within normal limits  COMPREHENSIVE METABOLIC PANEL - Abnormal; Notable for the following:    Glucose, Bld 141 (*)    Albumin 3.3 (*)    GFR calc non Af Amer 61 (*)    GFR calc Af Amer 71 (*)    All other components within normal limits  URINALYSIS, ROUTINE W REFLEX MICROSCOPIC - Abnormal; Notable for the following:    Hgb urine dipstick SMALL (*)    Protein, ur 30 (*)    Leukocytes, UA SMALL (*)    All other components within  normal limits  URINE MICROSCOPIC-ADD ON - Abnormal; Notable for the following:    Bacteria, UA MANY (*)    All other components within normal limits  URINE CULTURE  APTT  PROTIME-INR  TYPE AND SCREEN    Imaging Review Dg Hip Complete Left  04/07/2014   CLINICAL DATA:  Fall, left hip pain  EXAM: LEFT HIP - COMPLETE 2+ VIEW  COMPARISON:  None.  FINDINGS: There is a displaced subcapital fracture of the left femoral neck with cephalad migration of the distal fracture fragment. Hip is located.  IMPRESSION: Displaced femoral neck fracture.   Electronically Signed   By: Genevive BiStewart  Edmunds M.D.   On: 04/07/2014 10:19   Dg Chest Port 1 View  04/07/2014   CLINICAL DATA:   Preoperative evaluation; recent trauma ; hypertension  EXAM: PORTABLE CHEST - 1 VIEW  COMPARISON:  None.  FINDINGS: There is no edema or consolidation. There is minimal scarring in the left base. The heart is upper normal in size with normal pulmonary vascularity. No adenopathy. There is atherosclerotic change in the aorta. There is arthropathy with chronic rotator cuff tears in both shoulders. There is an azygos lobe on the right, an anatomic variant. No pneumothorax.  IMPRESSION: No edema or consolidation.  Minimal left base scarring.   Electronically Signed   By: Bretta BangWilliam  Woodruff M.D.   On: 04/07/2014 11:42     EKG Interpretation   Date/Time:  Tuesday April 07 2014 10:15:51 EDT Ventricular Rate:  74 PR Interval:  179 QRS Duration: 113 QT Interval:  414 QTC Calculation: 459 R Axis:   7 Text Interpretation:  Sinus rhythm Borderline intraventricular conduction  delay Abnormal R-wave progression, early transition Borderline  repolarization abnormality Confirmed by RAY MD, DANIELLE ([id number]) on  04/07/2014 10:21:23 AM      MDM   Final diagnoses:  None  Left hip fracture- Patient presented after fall, landed on left hip.  Left hip xray showed displaced left femoral neck fracture. Ortho consult, hip fracture orders placed.   Spoke to Dr. Victorino DikeHewitt with orthopedics.  Plan for surgery tomorrow. NPO at midnight, hold anticoagulation.  Admit to internal medicine. PCP in Dr. Leandrew KoyanagiBurdine with [facility].   Rocco SereneMorgan Islam Eichinger, MD 04/07/14 1203  Rocco SereneMorgan Brookelynne Dimperio, MD 04/07/14 [phone]

## 2014-04-07 NOTE — Progress Notes (Signed)
Orthopedic Tech Progress Note Patient Details:  April MuldersValerie L [name] 02/14/1925 [id number] [name] 04/07/2014, 5:56 PM

## 2014-04-07 NOTE — ED Notes (Signed)
Pt undressed, in gown, on continuous pulse oximetry and blood pressure cuff; friend at bedside

## 2014-04-08 ENCOUNTER — Encounter (HOSPITAL_COMMUNITY)
Admission: EM | Disposition: A | Discharge status: Rehabilitation Facility | Payer: Self-pay | Source: Home / Self Care | Attending: Internal Medicine

## 2014-04-08 ENCOUNTER — Encounter (HOSPITAL_COMMUNITY): Payer: Self-pay | Admitting: Certified Registered"

## 2014-04-08 ENCOUNTER — Inpatient Hospital Stay (HOSPITAL_COMMUNITY): Payer: Worker's Compensation

## 2014-04-08 ENCOUNTER — Encounter (HOSPITAL_COMMUNITY): Payer: Worker's Compensation | Admitting: Anesthesiology

## 2014-04-08 ENCOUNTER — Inpatient Hospital Stay (HOSPITAL_COMMUNITY): Payer: Worker's Compensation | Admitting: Anesthesiology

## 2014-04-08 DIAGNOSIS — S72002A Fracture of unspecified part of neck of left femur, initial encounter for closed fracture: Secondary | ICD-10-CM | POA: Diagnosis present

## 2014-04-08 HISTORY — PX: HIP ARTHROPLASTY: SHX981

## 2014-04-08 LAB — CBC
HCT: 33.2 % — ABNORMAL LOW (ref 36.0–46.0)
Hemoglobin: 11.4 g/dL — ABNORMAL LOW (ref 12.0–15.0)
MCH: 33.1 pg (ref 26.0–34.0)
MCHC: 34.3 g/dL (ref 30.0–36.0)
MCV: 96.5 fL (ref 78.0–100.0)
PLATELETS: 161 10*3/uL (ref 150–400)
RBC: 3.44 MIL/uL — ABNORMAL LOW (ref 3.87–5.11)
RDW: 14.2 % (ref 11.5–15.5)
WBC: 8.8 10*3/uL (ref 4.0–10.5)

## 2014-04-08 LAB — BASIC METABOLIC PANEL
BUN: 18 mg/dL (ref 6–23)
CHLORIDE: 109 meq/L (ref 96–112)
CO2: 20 mEq/L (ref 19–32)
Calcium: 8.4 mg/dL (ref 8.4–10.5)
Creatinine, Ser: 0.83 mg/dL (ref 0.50–1.10)
GFR calc non Af Amer: 62 mL/min — ABNORMAL LOW (ref >90)
GFR, EST AFRICAN AMERICAN: 72 mL/min — AB (ref >90)
Glucose, Bld: 136 mg/dL — ABNORMAL HIGH (ref 70–99)
POTASSIUM: 3.6 meq/L — AB (ref 3.7–5.3)
Sodium: 141 mEq/L (ref 137–147)

## 2014-04-08 LAB — GLUCOSE, CAPILLARY
GLUCOSE-CAPILLARY: 117 mg/dL — AB (ref 70–99)
GLUCOSE-CAPILLARY: 136 mg/dL — AB (ref 70–99)
Glucose-Capillary: 126 mg/dL — ABNORMAL HIGH (ref 70–99)
Glucose-Capillary: 102 mg/dL — ABNORMAL HIGH (ref 70–99)
Glucose-Capillary: 119 mg/dL — ABNORMAL HIGH (ref 70–99)

## 2014-04-08 LAB — SURGICAL PCR SCREEN
MRSA, PCR: NEGATIVE
Staphylococcus aureus: NEGATIVE

## 2014-04-08 SURGERY — HEMIARTHROPLASTY, HIP, DIRECT ANTERIOR APPROACH, FOR FRACTURE
Anesthesia: General | Site: Hip | Laterality: Left

## 2014-04-08 MED ORDER — PROPOFOL 10 MG/ML IV BOLUS
INTRAVENOUS | Status: AC
  Filled 2014-04-08: qty 20

## 2014-04-08 MED ORDER — ROCURONIUM BROMIDE 100 MG/10ML IV SOLN
INTRAVENOUS | Status: DC | PRN
  Administered 2014-04-08: 30 mg via INTRAVENOUS

## 2014-04-08 MED ORDER — MORPHINE SULFATE 2 MG/ML IJ SOLN: 0.5000 mg | INTRAMUSCULAR | Status: DC | PRN

## 2014-04-08 MED ORDER — OXYCODONE HCL 5 MG/5ML PO SOLN: 5.0000 mg | Freq: Once | ORAL | Status: DC | PRN

## 2014-04-08 MED ORDER — EPHEDRINE SULFATE 50 MG/ML IJ SOLN
INTRAMUSCULAR | Status: DC | PRN
  Administered 2014-04-08: 10 mg via INTRAVENOUS

## 2014-04-08 MED ORDER — HYDROCODONE-ACETAMINOPHEN 5-325 MG PO TABS: 1.0000 | ORAL_TABLET | Freq: Four times a day (QID) | ORAL | Status: DC | PRN

## 2014-04-08 MED ORDER — POTASSIUM CHLORIDE CRYS ER 20 MEQ PO TBCR
40.0000 meq | EXTENDED_RELEASE_TABLET | Freq: Once | ORAL | Status: AC
  Administered 2014-04-08: 40 meq via ORAL
  Filled 2014-04-08: qty 2

## 2014-04-08 MED ORDER — NEOSTIGMINE METHYLSULFATE 1 MG/ML IJ SOLN
INTRAMUSCULAR | Status: AC
  Filled 2014-04-08: qty 10

## 2014-04-08 MED ORDER — LIDOCAINE HCL (CARDIAC) 20 MG/ML IV SOLN
INTRAVENOUS | Status: AC
  Filled 2014-04-08: qty 5

## 2014-04-08 MED ORDER — MIDAZOLAM HCL 5 MG/5ML IJ SOLN
INTRAMUSCULAR | Status: DC | PRN
  Administered 2014-04-08: 1 mg via INTRAVENOUS

## 2014-04-08 MED ORDER — PROMETHAZINE HCL 25 MG/ML IJ SOLN
6.2500 mg | INTRAMUSCULAR | Status: DC | PRN
  Administered 2014-04-08: 6.25 mg via INTRAVENOUS

## 2014-04-08 MED ORDER — MORPHINE SULFATE 2 MG/ML IJ SOLN
1.0000 mg | INTRAMUSCULAR | Status: DC | PRN
  Administered 2014-04-08: 2 mg via INTRAVENOUS

## 2014-04-08 MED ORDER — ENOXAPARIN SODIUM 40 MG/0.4ML ~~LOC~~ SOLN: 40.0000 mg | SUBCUTANEOUS | Status: DC

## 2014-04-08 MED ORDER — ONDANSETRON HCL 4 MG/2ML IJ SOLN
INTRAMUSCULAR | Status: AC
  Filled 2014-04-08: qty 2

## 2014-04-08 MED ORDER — CEFAZOLIN SODIUM-DEXTROSE 2-3 GM-% IV SOLR
INTRAVENOUS | Status: DC | PRN
  Administered 2014-04-08: 2 g via INTRAVENOUS

## 2014-04-08 MED ORDER — LIDOCAINE HCL (CARDIAC) 20 MG/ML IV SOLN
INTRAVENOUS | Status: DC | PRN
  Administered 2014-04-08: 60 mg via INTRAVENOUS

## 2014-04-08 MED ORDER — ONDANSETRON HCL 4 MG/2ML IJ SOLN
INTRAMUSCULAR | Status: DC | PRN
Start: 2014-04-08 — End: 2014-04-08
  Administered 2014-04-08: 4 mg via INTRAVENOUS

## 2014-04-08 MED ORDER — FENTANYL CITRATE 0.05 MG/ML IJ SOLN
INTRAMUSCULAR | Status: DC | PRN
  Administered 2014-04-08: 25 ug via INTRAVENOUS
  Administered 2014-04-08: 75 ug via INTRAVENOUS
  Administered 2014-04-08: 25 ug via INTRAVENOUS

## 2014-04-08 MED ORDER — GLYCOPYRROLATE 0.2 MG/ML IJ SOLN
INTRAMUSCULAR | Status: AC
  Filled 2014-04-08: qty 2

## 2014-04-08 MED ORDER — POTASSIUM CHLORIDE 10 MEQ/100ML IV SOLN
10.0000 meq | INTRAVENOUS | Status: DC
  Filled 2014-04-08 (×2): qty 100

## 2014-04-08 MED ORDER — PHENOL 1.4 % MT LIQD: 1.0000 | OROMUCOSAL | Status: DC | PRN

## 2014-04-08 MED ORDER — ONDANSETRON HCL 4 MG PO TABS
4.0000 mg | ORAL_TABLET | Freq: Four times a day (QID) | ORAL | Status: DC | PRN
  Administered 2014-04-09: 4 mg via ORAL
  Filled 2014-04-08: qty 1

## 2014-04-08 MED ORDER — GLYCOPYRROLATE 0.2 MG/ML IJ SOLN
INTRAMUSCULAR | Status: DC | PRN
  Administered 2014-04-08: 0.4 mg via INTRAVENOUS

## 2014-04-08 MED ORDER — OXYCODONE HCL 5 MG PO TABS
5.0000 mg | ORAL_TABLET | ORAL | Status: DC | PRN
  Administered 2014-04-09 (×2): 10 mg via ORAL
  Filled 2014-04-08 (×2): qty 2

## 2014-04-08 MED ORDER — MORPHINE SULFATE 2 MG/ML IJ SOLN
INTRAMUSCULAR | Status: AC
  Filled 2014-04-08: qty 1

## 2014-04-08 MED ORDER — FENTANYL CITRATE 0.05 MG/ML IJ SOLN
INTRAMUSCULAR | Status: AC
  Filled 2014-04-08: qty 5

## 2014-04-08 MED ORDER — PROMETHAZINE HCL 25 MG/ML IJ SOLN
INTRAMUSCULAR | Status: AC
Start: 2014-04-08 — End: 2014-04-09
  Filled 2014-04-08: qty 1

## 2014-04-08 MED ORDER — SODIUM CHLORIDE 0.9 % IV SOLN
INTRAVENOUS | Status: DC
Start: 2014-04-08 — End: 2014-04-10
  Administered 2014-04-08: 21:00:00 via INTRAVENOUS

## 2014-04-08 MED ORDER — METOCLOPRAMIDE HCL 10 MG PO TABS: 5.0000 mg | ORAL_TABLET | Freq: Three times a day (TID) | ORAL | Status: DC | PRN

## 2014-04-08 MED ORDER — METOCLOPRAMIDE HCL 5 MG/ML IJ SOLN: 5.0000 mg | Freq: Three times a day (TID) | INTRAMUSCULAR | Status: DC | PRN

## 2014-04-08 MED ORDER — NEOSTIGMINE METHYLSULFATE 1 MG/ML IJ SOLN
INTRAMUSCULAR | Status: DC | PRN
Start: 2014-04-08 — End: 2014-04-08
  Administered 2014-04-08: 3 mg via INTRAVENOUS

## 2014-04-08 MED ORDER — OXYCODONE HCL 5 MG PO TABS: 5.0000 mg | ORAL_TABLET | Freq: Once | ORAL | Status: DC | PRN

## 2014-04-08 MED ORDER — PROPOFOL 10 MG/ML IV BOLUS
INTRAVENOUS | Status: DC | PRN
  Administered 2014-04-08: 90 mg via INTRAVENOUS

## 2014-04-08 MED ORDER — CEFAZOLIN SODIUM-DEXTROSE 2-3 GM-% IV SOLR
2.0000 g | Freq: Four times a day (QID) | INTRAVENOUS | Status: AC
  Administered 2014-04-08: 2 g via INTRAVENOUS
  Filled 2014-04-08 (×2): qty 50

## 2014-04-08 MED ORDER — SODIUM CHLORIDE 0.9 % IR SOLN
Status: DC | PRN
  Administered 2014-04-08: 1000 mL

## 2014-04-08 MED ORDER — ACETAMINOPHEN 325 MG PO TABS: 650.0000 mg | ORAL_TABLET | Freq: Four times a day (QID) | ORAL | Status: DC | PRN

## 2014-04-08 MED ORDER — MIDAZOLAM HCL 2 MG/2ML IJ SOLN
INTRAMUSCULAR | Status: AC
  Filled 2014-04-08: qty 2

## 2014-04-08 MED ORDER — ONDANSETRON HCL 4 MG/2ML IJ SOLN: 4.0000 mg | Freq: Four times a day (QID) | INTRAMUSCULAR | Status: DC | PRN

## 2014-04-08 MED ORDER — MENTHOL 3 MG MT LOZG: 1.0000 | LOZENGE | OROMUCOSAL | Status: DC | PRN

## 2014-04-08 MED ORDER — LACTATED RINGERS IV SOLN
INTRAVENOUS | Status: DC
  Administered 2014-04-08: 15:00:00 via INTRAVENOUS

## 2014-04-08 MED ORDER — ACETAMINOPHEN 650 MG RE SUPP: 650.0000 mg | Freq: Four times a day (QID) | RECTAL | Status: DC | PRN

## 2014-04-08 MED ORDER — ENOXAPARIN SODIUM 30 MG/0.3ML ~~LOC~~ SOLN
30.0000 mg | SUBCUTANEOUS | Status: DC
  Administered 2014-04-09 – 2014-04-10 (×2): 30 mg via SUBCUTANEOUS
  Filled 2014-04-08 (×4): qty 0.3

## 2014-04-08 SURGICAL SUPPLY — 47 items
BLADE SAW SAG 73X25 THK (BLADE) ×1
BLADE SAW SGTL 73X25 THK (BLADE) ×1 IMPLANT
BRUSH FEMORAL CANAL (MISCELLANEOUS) IMPLANT
CAPT HIP HD POR BIPOL/UNIPOL ×2 IMPLANT
COVER SURGICAL LIGHT HANDLE (MISCELLANEOUS) ×2 IMPLANT
DRAPE INCISE IOBAN 66X45 STRL (DRAPES) ×4 IMPLANT
DRAPE ORTHO SPLIT 77X108 STRL (DRAPES) ×2
DRAPE SURG ORHT 6 SPLT 77X108 (DRAPES) ×2 IMPLANT
DRAPE U-SHAPE 47X51 STRL (DRAPES) ×2 IMPLANT
DRSG MEPILEX BORDER 4X8 (GAUZE/BANDAGES/DRESSINGS) ×2 IMPLANT
DURAPREP 26ML APPLICATOR (WOUND CARE) ×2 IMPLANT
ELECT BLADE 4.0 EZ CLEAN MEGAD (MISCELLANEOUS) ×2
ELECT REM PT RETURN 9FT ADLT (ELECTROSURGICAL) ×2
ELECTRODE BLDE 4.0 EZ CLN MEGD (MISCELLANEOUS) ×1 IMPLANT
ELECTRODE REM PT RTRN 9FT ADLT (ELECTROSURGICAL) ×1 IMPLANT
GLOVE BIOGEL PI ORTHO PRO 7.5 (GLOVE) ×1
GLOVE BIOGEL PI ORTHO PRO SZ8 (GLOVE) ×1
GLOVE ORTHO TXT STRL SZ7.5 (GLOVE) ×2 IMPLANT
GLOVE PI ORTHO PRO STRL 7.5 (GLOVE) ×1 IMPLANT
GLOVE PI ORTHO PRO STRL SZ8 (GLOVE) ×1 IMPLANT
GLOVE SURG ORTHO 8.5 STRL (GLOVE) ×2 IMPLANT
GOWN STRL REUS W/ TWL XL LVL3 (GOWN DISPOSABLE) ×3 IMPLANT
GOWN STRL REUS W/TWL XL LVL3 (GOWN DISPOSABLE) ×3
HANDPIECE INTERPULSE COAX TIP (DISPOSABLE) ×1
IMMOBILIZER KNEE 22 (SOFTGOODS) ×2 IMPLANT
KIT BASIN OR (CUSTOM PROCEDURE TRAY) ×2 IMPLANT
KIT ROOM TURNOVER OR (KITS) ×2 IMPLANT
MANIFOLD NEPTUNE II (INSTRUMENTS) ×2 IMPLANT
NS IRRIG 1000ML POUR BTL (IV SOLUTION) ×2 IMPLANT
PACK TOTAL JOINT (CUSTOM PROCEDURE TRAY) ×2 IMPLANT
PAD ARMBOARD 7.5X6 YLW CONV (MISCELLANEOUS) ×4 IMPLANT
SET HNDPC FAN SPRY TIP SCT (DISPOSABLE) ×1 IMPLANT
STAPLER VISISTAT 35W (STAPLE) ×2 IMPLANT
STRIP CLOSURE SKIN 1/2X4 (GAUZE/BANDAGES/DRESSINGS) IMPLANT
SUT ETHIBOND NAB CT1 #1 30IN (SUTURE) ×6 IMPLANT
SUT MNCRL AB 3-0 PS2 18 (SUTURE) IMPLANT
SUT VIC AB 0 CT1 27 (SUTURE) ×1
SUT VIC AB 0 CT1 27XBRD ANBCTR (SUTURE) ×1 IMPLANT
SUT VIC AB 1 CT1 27 (SUTURE)
SUT VIC AB 1 CT1 27XBRD ANBCTR (SUTURE) IMPLANT
SUT VIC AB 2-0 CT1 27 (SUTURE) ×1
SUT VIC AB 2-0 CT1 TAPERPNT 27 (SUTURE) ×1 IMPLANT
TOWEL OR 17X24 6PK STRL BLUE (TOWEL DISPOSABLE) ×4 IMPLANT
TOWEL OR 17X26 10 PK STRL BLUE (TOWEL DISPOSABLE) ×2 IMPLANT
TOWER CARTRIDGE SMART MIX (DISPOSABLE) IMPLANT
TRAY FOLEY CATH 16FRSI W/METER (SET/KITS/TRAYS/PACK) IMPLANT
WATER STERILE IRR 1000ML POUR (IV SOLUTION) ×6 IMPLANT

## 2014-04-08 NOTE — Progress Notes (Signed)
Orthopedics Progress Note  Subjective: I had a good night  Objective:  Filed Vitals:   04/08/14 0628  BP: 152/54  Pulse: 64  Temp: 98.7 F (37.1 C)  Resp: 18    General: Awake and alert  Musculoskeletal: left leg well perfused, unable to move it due to pain, right LE with pain free ROM Bilateral UEs with no pain with push pull. Neurovascularly intact  Lab Results  Component Value Date   WBC 8.8 04/08/2014   HGB 11.4* 04/08/2014   HCT 33.2* 04/08/2014   MCV 96.5 04/08/2014   PLT 161 04/08/2014       Component Value Date/Time   NA 141 04/08/2014 0414   K 3.6* 04/08/2014 0414   CL 109 04/08/2014 0414   CO2 20 04/08/2014 0414   GLUCOSE 136* 04/08/2014 0414   BUN 18 04/08/2014 0414   CREATININE 0.83 04/08/2014 0414   CALCIUM 8.4 04/08/2014 0414   GFRNONAA 62* 04/08/2014 0414   GFRAA 72* 04/08/2014 0414    Lab Results  Component Value Date   INR 1.06 04/07/2014    Assessment/Plan: S/p mechanical fall with left hip displaced femoral neck fracture Discussed planned surgery (hemiarthoplasty) with the patient and her family.  She agrees with the plan.  Surgery later today. Npo after clear liquid breakfast  Almedia BallsSteven R. Ranell PatrickNorris, MD 04/08/2014 7:48 AM

## 2014-04-08 NOTE — Discharge Instructions (Signed)
50% weight bearing on the left leg  Keep the incision clean and dry and covered.  May get wet in the shower in one week.  Follow up in the office [phone] in two weeks.  May D/C knee immobilizer on discharge from the hospital Posterior hip precautions

## 2014-04-08 NOTE — Progress Notes (Signed)
Nutrition Brief Note  Consult received from hip fracture protocol.  Pt reports that she has had no recent weight loss and has a good appetite.  Encouraged good intake after surgery, discussed protein sources, discussed supplements pt can use if appetite decreases.   Wt Readings from Last 15 Encounters:  04/07/14 180 lb (81.647 kg)  04/07/14 180 lb (81.647 kg)    Body mass index is 27.38 kg/(m^2). Patient meets criteria for overweight based on current BMI.   Current diet order is NPO for surgery this afternoon. Labs and medications reviewed.   No nutrition interventions warranted at this time. If nutrition issues arise, please consult RD.   Kendell BaneHeather Luz Mares RD, LDN, CNSC [phone] Pager [phone] After Hours Pager

## 2014-04-08 NOTE — Anesthesia Preprocedure Evaluation (Signed)
Anesthesia Evaluation  Patient identified by MRN, date of birth, ID band Patient awake    Reviewed: Allergy & Precautions, H&P , NPO status , Patient's Chart, lab work & pertinent test results  History of Anesthesia Complications Negative for: history of anesthetic complications  Airway       Dental   Pulmonary neg pulmonary ROS,  breath sounds clear to auscultation        Cardiovascular hypertension, Rhythm:Regular Rate:Normal     Neuro/Psych negative neurological ROS     GI/Hepatic   Endo/Other  diabetesHypothyroidism   Renal/GU      Musculoskeletal   Abdominal   Peds  Hematology   Anesthesia Other Findings   Reproductive/Obstetrics                           Anesthesia Physical Anesthesia Plan  ASA: III  Anesthesia Plan: General   Post-op Pain Management:    Induction: Intravenous  Airway Management Planned: Oral ETT  Additional Equipment:   Intra-op Plan:   Post-operative Plan: Extubation in OR  Informed Consent: I have reviewed the patients History and Physical, chart, labs and discussed the procedure including the risks, benefits and alternatives for the proposed anesthesia with the patient or authorized representative who has indicated his/her understanding and acceptance.     Plan Discussed with:   Anesthesia Plan Comments:         Anesthesia Quick Evaluation

## 2014-04-08 NOTE — Care Management Note (Signed)
CARE MANAGEMENT NOTE 04/08/2014  Patient:  Vanetta MuldersMARSHALL,Annalynn L   Account Number:  [id number]  Date Initiated:  04/08/2014  Documentation initiated by:  Vance PeperBRADY,Anthem Frazer  Subjective/Objective Assessment:   80 yr old female admitted with left femoral neck fracture, OR today for left hip hemiarthroplasty.     Action/Plan:   PT/OT to eval.   Anticipated DC Date:     Anticipated DC Plan:           Choice offered to / List presented to:             Status of service:  In process, will continue to follow Comments:  04/08/14 Vance PeperSusan Kitzia Camus, RN BSN Case Manager [phone] Patient is under worker's comp. claim #[id number]  [facility]ellie SimmeringBerkley Southeast  [phone]. Patient's adjuster is Delice LeschGail Trible [phone].

## 2014-04-08 NOTE — Interval H&P Note (Signed)
History and Physical Interval Note:  04/08/2014 4:34 PM  April [name]  has presented today for surgery, with the diagnosis of Left Femoral Neck Fracture  The various methods of treatment have been discussed with the patient and family. After consideration of risks, benefits and other options for treatment, the patient has consented to  Procedure(s): LEFT HIP HEMIARTHROPLASTY (Left) as a surgical intervention .  The patient's history has been reviewed, patient examined, no change in status, stable for surgery.  I have reviewed the patient's chart and labs.  Questions were answered to the patient's satisfaction.     Verlee RossettiSteven R Dawnya Grams

## 2014-04-08 NOTE — Anesthesia Procedure Notes (Signed)
Procedure Name: Intubation Date/Time: 04/08/2014 5:20 PM Performed by: Charm BargesBUTLER, Yunique Dearcos R Pre-anesthesia Checklist: Patient identified, Emergency Drugs available, Suction available, Patient being monitored and Timeout performed Patient Re-evaluated:Patient Re-evaluated prior to inductionOxygen Delivery Method: Circle system utilized Preoxygenation: Pre-oxygenation with 100% oxygen Intubation Type: IV induction Ventilation: Mask ventilation without difficulty Laryngoscope Size: Mac and 3 Grade View: Grade III Tube type: Oral Number of attempts: 1 Airway Equipment and Method: Stylet Placement Confirmation: ETT inserted through vocal cords under direct vision,  positive ETCO2 and breath sounds checked- equal and bilateral Secured at: 22 cm Tube secured with: Tape Dental Injury: Teeth and Oropharynx as per pre-operative assessment

## 2014-04-08 NOTE — ED Provider Notes (Signed)
80 y.o. Female fell and presents with left hip fracture.  Patient awake and alert VSS Heent- no trauma  lle- hip ttp dp and sensation intact distal to injury.   I performed a history and physical examination of April [name] and discussed her management with Dr. Aundria Rudogers.  I agree with the history, physical, assessment, and plan of care, with the following exceptions: None  I was present for the following procedures: None Time Spent in Critical Care of the patient: None Time spent in discussions with the patient and family: 10  April [name]    April Quarryanielle S Lemont Sitzmann, MD 04/08/14 1153

## 2014-04-08 NOTE — Anesthesia Postprocedure Evaluation (Signed)
Anesthesia Post Note  Patient: April [name]  Procedure(s) Performed: Procedure(s) (LRB): LEFT HIP HEMIARTHROPLASTY (Left)  Anesthesia type: General  Patient location: PACU  Post pain: Pain level controlled  Post assessment: Patient's Cardiovascular Status Stable  Last Vitals:  Filed Vitals:   04/08/14 1839  BP: 171/64  Pulse: 65  Temp: 36.8 C  Resp: 12    Post vital signs: Reviewed and stable  Level of consciousness: alert  Complications: No apparent anesthesia complications

## 2014-04-08 NOTE — H&P (View-Only) (Signed)
Orthopedics Progress Note  Subjective: I had a good night  Objective:  Filed Vitals:   04/08/14 0628  BP: 152/54  Pulse: 64  Temp: 98.7 F (37.1 C)  Resp: 18    General: Awake and alert  Musculoskeletal: left leg well perfused, unable to move it due to pain, right LE with pain free ROM Bilateral UEs with no pain with push pull. Neurovascularly intact  Lab Results  Component Value Date   WBC 8.8 04/08/2014   HGB 11.4* 04/08/2014   HCT 33.2* 04/08/2014   MCV 96.5 04/08/2014   PLT 161 04/08/2014       Component Value Date/Time   NA 141 04/08/2014 0414   K 3.6* 04/08/2014 0414   CL 109 04/08/2014 0414   CO2 20 04/08/2014 0414   GLUCOSE 136* 04/08/2014 0414   BUN 18 04/08/2014 0414   CREATININE 0.83 04/08/2014 0414   CALCIUM 8.4 04/08/2014 0414   GFRNONAA 62* 04/08/2014 0414   GFRAA 72* 04/08/2014 0414    Lab Results  Component Value Date   INR 1.06 04/07/2014    Assessment/Plan: S/p mechanical fall with left hip displaced femoral neck fracture Discussed planned surgery (hemiarthoplasty) with the patient and her family.  She agrees with the plan.  Surgery later today. Npo after clear liquid breakfast  Steven R. Cali Hope, MD 04/08/2014 7:48 AM    

## 2014-04-08 NOTE — Progress Notes (Signed)
PROGRESS NOTE  April MuldersValerie L [name] WUJ:[id number] DOB: 09/21/1925 DOA: 04/07/2014 PCP: April [name]  Assessment/Plan: left displaced femoral neck fracture  Secondary to mechanical fall.  surgery today.  Orthopedic consultation    hypertension  Stable. Continue home regimen of atenolol, lisinopril.    diabetes mellitus  half home dose of Lantus as patient is to have surgery tomorrow and be n.p.o. after midnight. sliding scale insulin.   hypothyroidism  TSH ok home dose Synthroid.   Hypokalemia -replete   hyperlipidemia  Continue statin.   urinary tract infection  Urine cultures are pending. Will place on IV Rocephin.    dehydration  IV fluids.   Code Status: full Family Communication: patient Disposition Plan: prob SNF after surger   Consultants:  ortho  Procedures:      HPI/Subjective: Pain controlled  Objective: Filed Vitals:   04/08/14 0944  BP: 187/67  Pulse: 63  Temp:   Resp:     Intake/Output Summary (Last 24 hours) at 04/08/14 0952 Last data filed at 04/08/14 0800  Gross per 24 hour  Intake    885 ml  Output   1200 ml  Net   -315 ml   Filed Weights   04/07/14 0902  Weight: 81.647 kg (180 lb)    Exam:   General:  A+Ox3, NAD  Cardiovascular: rrr  Respiratory: clear anterior  Abdomen: +BS, soft  Musculoskeletal: no edema  Data Reviewed: Basic Metabolic Panel:  Recent Labs Lab 04/07/14 1110 04/08/14 0414  NA 142 141  K 4.1 3.6*  CL 107 109  CO2 22 20  GLUCOSE 141* 136*  BUN 22 18  CREATININE 0.84 0.83  CALCIUM 8.9 8.4   Liver Function Tests:  Recent Labs Lab 04/07/14 1110  AST 12  ALT <5  ALKPHOS 87  BILITOT 0.3  PROT 6.8  ALBUMIN 3.3*   No results found for this basename: LIPASE, AMYLASE,  in the last 168 hours No results found for this basename: AMMONIA,  in the last 168 hours CBC:  Recent Labs Lab 04/07/14 1110 04/08/14 0414  WBC 10.0 8.8  NEUTROABS 8.2*  --   HGB 12.7 11.4*  HCT  36.6 33.2*  MCV 94.8 96.5  PLT 180 161   Cardiac Enzymes: No results found for this basename: CKTOTAL, CKMB, CKMBINDEX, TROPONINI,  in the last 168 hours BNP (last 3 results) No results found for this basename: PROBNP,  in the last 8760 hours CBG:  Recent Labs Lab 04/07/14 1603 04/07/14 2235 04/07/14 2246 04/08/14 0655  GLUCAP 129* 119* 123* 126*    Recent Results (from the past 240 hour(s))  SURGICAL PCR SCREEN     Status: None   Collection Time    04/08/14  3:44 AM      Result Value Ref Range Status   MRSA, PCR NEGATIVE  NEGATIVE Final   Staphylococcus aureus NEGATIVE  NEGATIVE Final   Comment:            The Xpert SA Assay (FDA     approved for NASAL specimens     in patients over 78 years of age),     is one component of     a comprehensive surveillance     program.  Test performance has     been validated by [facility] for patients greater     than or equal to 78 year old.     It is not intended     to diagnose infection nor to  guide or monitor treatment.     Studies: Dg Hip Complete Left  04/07/2014   CLINICAL DATA:  Fall, left hip pain  EXAM: LEFT HIP - COMPLETE 2+ VIEW  COMPARISON:  None.  FINDINGS: There is a displaced subcapital fracture of the left femoral neck with cephalad migration of the distal fracture fragment. Hip is located.  IMPRESSION: Displaced femoral neck fracture.   Electronically Signed   By: April BiStewart  [name] M.D.   On: 04/07/2014 10:19   Dg Chest Port 1 View  04/07/2014   CLINICAL DATA:  Preoperative evaluation; recent trauma ; hypertension  EXAM: PORTABLE CHEST - 1 VIEW  COMPARISON:  None.  FINDINGS: There is no edema or consolidation. There is minimal scarring in the left base. The heart is upper normal in size with normal pulmonary vascularity. No adenopathy. There is atherosclerotic change in the aorta. There is arthropathy with chronic rotator cuff tears in both shoulders. There is an azygos lobe on the right, an anatomic variant.  No pneumothorax.  IMPRESSION: No edema or consolidation.  Minimal left base scarring.   Electronically Signed   By: April BangWilliam  [name] M.D.   On: 04/07/2014 11:42    Scheduled Meds: . amLODipine  10 mg Oral Daily  . atenolol  100 mg Oral Daily  . atorvastatin  10 mg Oral q1800  .  ceFAZolin (ANCEF) IV  2 g Intravenous On Call to OR  . cefTRIAXone (ROCEPHIN)  IV  1 g Intravenous Q24H  . docusate sodium  100 mg Oral BID  . gemfibrozil  600 mg Oral BID AC  . insulin aspart  0-15 Units Subcutaneous TID WC  . insulin glargine  15 Units Subcutaneous QHS  . levothyroxine  125 mcg Oral QAC breakfast  . lisinopril  40 mg Oral Daily  . pantoprazole  40 mg Oral Daily  . potassium chloride  10 mEq Intravenous Q1 Hr x 2   Continuous Infusions: . sodium chloride 75 mL/hr at 04/08/14 0815  . sodium chloride 0.9 % 1,000 mL infusion     Antibiotics Given (last 72 hours)   Date/Time Action Medication Dose Rate   04/07/14 1400 Given   cefTRIAXone (ROCEPHIN) 1 g in dextrose 5 % 50 mL IVPB 1 g 100 mL/hr   04/07/14 1624 Given  [First dose given by ED.]   cefTRIAXone (ROCEPHIN) 1 g in dextrose 5 % 50 mL IVPB 1 g 100 mL/hr      Principal Problem:   Left displaced femoral neck fracture Active Problems:   HTN (hypertension)   Hyperlipidemia   DM (diabetes mellitus)   Hypothyroidism   OA (osteoarthritis)   UTI (lower urinary tract infection)   Dehydration    Time spent: 35    April [name]  April [name] Pager [phone]. If 7PM-7AM, please contact night-coverage at [email or web address], password [facility] [name] 04/08/2014, 9:52 AM  LOS: 1 day

## 2014-04-08 NOTE — Brief Op Note (Signed)
04/07/2014 - 04/08/2014  6:50 PM  PATIENT:  April [name]  80 y.o. female  PRE-OPERATIVE DIAGNOSIS:  Left Femoral Neck Fracture, displaced  POST-OPERATIVE DIAGNOSIS:  Left Femoral Neck fracture, displaced  PROCEDURE:  Procedure(s): LEFT HIP HEMIARTHROPLASTY (Left), DePuy Triloc  SURGEON:  Surgeon(s) and Role:    * Verlee RossettiSteven R Aragorn Recker, MD - Primary  PHYSICIAN ASSISTANT:   ASSISTANTS: Thea Gisthomas B Dixon, PA-C   ANESTHESIA:   general  EBL:  Total I/O In: 1500 [I.V.:1500] Out: 2900 [Urine:2850; Blood:50]  BLOOD ADMINISTERED:none  DRAINS: none   LOCAL MEDICATIONS USED:  NONE  SPECIMEN:  No Specimen  DISPOSITION OF SPECIMEN:  N/A  COUNTS:  YES  TOURNIQUET:  * No tourniquets in log *  DICTATION: .Other Dictation: Dictation Number [id number]  PLAN OF CARE: Admit to inpatient   PATIENT DISPOSITION:  PACU - hemodynamically stable.   Delay start of Pharmacological VTE agent (>24hrs) due to surgical blood loss or risk of bleeding: no

## 2014-04-08 NOTE — Transfer of Care (Signed)
Immediate Anesthesia Transfer of Care Note  Patient: April [name]  Procedure(s) Performed: Procedure(s): LEFT HIP HEMIARTHROPLASTY (Left)  Patient Location: PACU  Anesthesia Type:General  Level of Consciousness: awake, alert  and oriented  Airway & Oxygen Therapy: Patient Spontanous Breathing and Patient connected to nasal cannula oxygen  Post-op Assessment: Report given to PACU RN, Post -op Vital signs reviewed and stable and Patient moving all extremities  Post vital signs: Reviewed and stable  Complications: No apparent anesthesia complications

## 2014-04-09 DIAGNOSIS — N390 Urinary tract infection, site not specified: Secondary | ICD-10-CM

## 2014-04-09 DIAGNOSIS — S72009A Fracture of unspecified part of neck of unspecified femur, initial encounter for closed fracture: Secondary | ICD-10-CM

## 2014-04-09 LAB — GLUCOSE, CAPILLARY
GLUCOSE-CAPILLARY: 106 mg/dL — AB (ref 70–99)
GLUCOSE-CAPILLARY: 134 mg/dL — AB (ref 70–99)
GLUCOSE-CAPILLARY: 122 mg/dL — AB (ref 70–99)
GLUCOSE-CAPILLARY: 155 mg/dL — AB (ref 70–99)

## 2014-04-09 LAB — BASIC METABOLIC PANEL
BUN: 15 mg/dL (ref 6–23)
CO2: 20 mEq/L (ref 19–32)
Calcium: 8.6 mg/dL (ref 8.4–10.5)
Chloride: 110 mEq/L (ref 96–112)
Creatinine, Ser: 0.82 mg/dL (ref 0.50–1.10)
GFR, EST AFRICAN AMERICAN: 73 mL/min — AB (ref >90)
GFR, EST NON AFRICAN AMERICAN: 63 mL/min — AB (ref >90)
Glucose, Bld: 124 mg/dL — ABNORMAL HIGH (ref 70–99)
Potassium: 4.2 mEq/L (ref 3.7–5.3)
SODIUM: 143 meq/L (ref 137–147)

## 2014-04-09 LAB — URINE CULTURE: Colony Count: >=100000

## 2014-04-09 LAB — CBC
HCT: 32.2 % — ABNORMAL LOW (ref 36.0–46.0)
HEMOGLOBIN: 11 g/dL — AB (ref 12.0–15.0)
MCH: 32.7 pg (ref 26.0–34.0)
MCHC: 34.2 g/dL (ref 30.0–36.0)
MCV: 95.8 fL (ref 78.0–100.0)
Platelets: 163 10*3/uL (ref 150–400)
RBC: 3.36 MIL/uL — ABNORMAL LOW (ref 3.87–5.11)
RDW: 14.1 % (ref 11.5–15.5)
WBC: 10.6 10*3/uL — AB (ref 4.0–10.5)

## 2014-04-09 NOTE — Evaluation (Signed)
Occupational Therapy Evaluation Patient Details Name: April [name] MRN: [id number] DOB: 04/25/1925 Today's Date: 04/09/2014    History of Present Illness Left hip hemiarthroplasty following a fall and resulting fracture.   Clinical Impression   Pt presents with anxiety, decreased balance, and weakness interfering with ability to perform mobility and ADL.  She requires +2 assist for all mobility and is dependent in bathing, dressing and toileting.  Instructed pt in hip precautions and introduced her to AE and DME.  Her family has provided a tub bench and AE since her injury and are very supportive. Will follow acutely.    Follow Up Recommendations  CIR;Supervision/Assistance - 24 hour    Equipment Recommendations   (defer to next venue)    Recommendations for Other Services Rehab consult     Precautions / Restrictions Precautions Precautions: Posterior Hip;Fall Precaution Booklet Issued: Yes (comment) Precaution Comments: Reviewed precautions Required Braces or Orthoses: Knee Immobilizer - Left Knee Immobilizer - Left: Other (comment) Restrictions Weight Bearing Restrictions: Yes LLE Weight Bearing: Partial weight bearing LLE Partial Weight Bearing Percentage or Pounds: 50%      Mobility Bed Mobility Overal bed mobility:  (not assessed, pt in chair) Bed Mobility: Supine to Sit     Supine to sit: Min assist;HOB elevated     General bed mobility comments: Min assist with bed mobility for trunk contorl. HOB elevated, needs assist for scooting hips forward. Verbal cues for technique and relies heavily on rail  Transfers Overall transfer level: Needs assistance Equipment used: Rolling walker (2 wheeled) Transfers: Sit to/from [employer] CorporationStand;Stand Pivot Transfers Sit to Stand: +2 physical assistance;Mod assist Stand pivot transfers: +2 physical assistance;Mod assist       General transfer comment: left knee immobilizer on to assist with compliance with precautions.     Balance Overall balance assessment: Needs assistance Sitting-balance support: Single extremity supported Sitting balance-Leahy Scale: Poor Sitting balance - Comments: Sits EOB without assist, needs 1 UE for support   Standing balance support: Bilateral upper extremity supported Standing balance-Leahy Scale: Poor Standing balance comment: Pt needs mod assist to stand with bil UE support on RW                            ADL Overall ADL's : Needs assistance/impaired Eating/Feeding: Independent;Sitting   Grooming: Wash/dry hands;Wash/dry face;Brushing hair;Oral care;Set up;Sitting   Upper Body Bathing: Sitting;Minimal assitance   Lower Body Bathing: Total assistance;Sit to/from stand;Sitting/lateral leans   Upper Body Dressing : Minimal assistance;Sitting   Lower Body Dressing: Total assistance;Sitting/lateral leans;Sit to/from stand   Toilet Transfer: +2 for physical assistance;Moderate assistance;Stand-pivot;BSC   Toileting- Clothing Manipulation and Hygiene: Total assistance;Sit to/from stand       Functional mobility during ADLs: +2 for physical assistance;Moderate assistance General ADL Comments: Began instruction in use of AE and DME.  Instructed in hip precautions related to LB ADL.     Vision                     Perception     Praxis      Pertinent Vitals/Pain L hip, repositioned, premedicated     Hand Dominance Right   Extremity/Trunk Assessment Upper Extremity Assessment Upper Extremity Assessment: Overall WFL for tasks assessed   Lower Extremity Assessment Lower Extremity Assessment: Defer to PT evaluation LLE Deficits / Details: Decreased strength and ROM LLE: Unable to fully assess due to pain;Unable to fully assess due to immobilization  Communication Communication Communication: No difficulties   Cognition Arousal/Alertness: Awake/alert Behavior During Therapy: Anxious Overall Cognitive Status: Within Functional  Limits for tasks assessed                     General Comments       Exercises      Shoulder Instructions      Home Living Family/patient expects to be discharged to:: Skilled nursing facility Living Arrangements: Children Available Help at Discharge: Family;Available 24 hours/day Type of Home: House Home Access: Stairs to enter Entergy CorporationEntrance Stairs-Number of Steps: 3 Entrance Stairs-Rails: Right Home Layout: One level     Bathroom Shower/Tub: Chief Strategy OfficerTub/shower unit   Bathroom Toilet: Standard     Home Equipment: Environmental consultantWalker - 2 wheels;Tub bench;Adaptive equipment Adaptive Equipment: Reacher;Sock aid;Long-handled shoe horn;Long-handled sponge Additional Comments: family member that works for [employer] company provided, pt has not been educated in use      Prior Functioning/Environment Level of Independence: Independent        Comments: Pt is very active at baseline.    OT Diagnosis: Generalized weakness;Acute pain   OT Problem List: Decreased strength;Decreased activity tolerance;Impaired balance (sitting and/or standing);Decreased safety awareness;Decreased knowledge of use of DME or AE;Decreased knowledge of precautions;Obesity;Pain   OT Treatment/Interventions: Self-care/ADL training;DME and/or AE instruction;Therapeutic activities;Patient/family education;Balance training    OT Goals(Current goals can be found in the care plan section) Acute Rehab OT Goals Patient Stated Goal: Return to PLOF. OT Goal Formulation: With patient Time For Goal Achievement: 04/23/14 Potential to Achieve Goals: Good ADL Goals Pt Will Perform Grooming: with supervision;standing Pt Will Perform Lower Body Bathing: with supervision;with adaptive equipment;sit to/from stand Pt Will Perform Lower Body Dressing: with supervision;with adaptive equipment;sit to/from stand Pt Will Transfer to Toilet: with min guard assist;ambulating;bedside commode (over toilet) Pt Will Perform Toileting - Clothing  Manipulation and hygiene: with supervision;sit to/from stand Additional ADL Goal #1: Pt will generalize posterior hip precautions in ADL and mobility independently.  OT Frequency: Min 2X/week   Barriers to D/C:            Co-evaluation              End of Session    Activity Tolerance: Patient limited by pain Patient left: in chair;with call bell/phone within reach;with family/visitor present   Time: [id number] OT Time Calculation (min): 24 min Charges:  OT General Charges $OT Visit: 1 Procedure OT Evaluation $Initial OT Evaluation Tier I: 1 Procedure OT Treatments $Self Care/Home Management : 8-22 mins G-Codes:    [place] BailiffJulie Lynn Amreen Raczkowski 04/09/2014, 1:25 PM [phone]

## 2014-04-09 NOTE — Op Note (Signed)
NAMMichel Bickers:  Begnaud, Jeryl            ACCOUNT NO.:  [id number]  MEDICAL RECORD NO.:  [id number]  LOCATION:  [id number]                        FACILITY:  [facility]  PHYSICIAN:  Almedia BallsSteven R. Ranell PatrickNorris, M.D. DATE OF BIRTH:  [birth year]  DATE OF PROCEDURE:  04/08/2014 DATE OF DISCHARGE:                              OPERATIVE REPORT   PREOPERATIVE DIAGNOSIS:  Displaced left femoral neck fracture.  POSTOPERATIVE DIAGNOSIS:  Displaced left femoral neck fracture.  PROCEDURE PERFORMED:  Left hip hemiarthroplasty using DePuy Tri-Lock prosthesis.  ATTENDING SURGEON:  Almedia BallsSteven R. Ranell PatrickNorris, M.D.  ASSISTANT:  Konrad Felixhomas Brad Dixon, [place], who was scrubbed during the entire procedure and necessary for satisfactory completion of surgery.  ANESTHESIA:  General anesthesia was used.  ESTIMATED BLOOD LOSS:  Less than 100 mL.  FLUID REPLACEMENT:  1500 mL of crystalloid.  INSTRUMENT COUNTS:  Correct.  COMPLICATIONS:  There were no complications.  ANTIBIOTICS:  Perioperative antibiotics were given.  INDICATIONS:  The patient is an 80 year old female who suffered a ground level fall injuring her left hip.  The patient was unable to weight bear after injury.  The patient presented to the hospital with a displaced femoral neck fracture.  Orthopedics was consulted.  The patient was admitted and cleared for surgery.  She presents now for left hip hemiarthroplasty.  Informed consent obtained.  DESCRIPTION OF PROCEDURE:  After an adequate level of anesthesia was achieved, the patient was positioned in the right lateral decubitus position with the left hip up.  The down leg was padded appropriately. The patient was secured to the operating room table.  An axillary roll was utilized, and again, neurovascular structures were checked.  We then sterilely prepped and draped the left hip and thigh down to the ankle in a standard fashion.  After time-out had been called, we entered the patient's hip area using a standard posterior  Kocher-Langenbeck approach.  We started the vastus ridge at the hip and extended posteriorly along the path of the gluteus maximus fibers, dissection down through subcutaneous tissues using Bovie, identified the fascia, tensor fascia lata and divided that in line with the skin incision.  We then identified the gluteus medius and retracted that, divided the piriformis and short external rotators identifying a fracture hematoma. Next, we went ahead and made our neck cut using a neck resection guide one finger breadth above the lesser trochanter with an oscillating saw. We then were able to remove the femoral head and sized it to a 47 diameter, removed the ligamentum teres using the Bovie.  We irrigated thoroughly the acetabulum and noted there would be good condition of the cartilage and labrum.  We then went ahead and prepared the femoral canal with initially the canal entry reamer and then went ahead and did sequential broaching up to a size 5 DePuy Tri-Lock stem.  Once we were able to complete the broaching, we used that trial broach, the 5 Tri- Lock standard with 0 offset neck and then the 47 monopolar head and reduced the hip.  This was again with about 20-30 degrees of anteversion which matched the anteversion of the patient's femoral neck, and the hip reduced nicely with excellent stability and range of motion.  We felt like  we could probably accommodate a +5 for just a little tighter feel, but the stability was excellent at 90-90.  At this point, we removed the trial components, thoroughly irrigated, and then selected the real Tri- Lock DePuy size 5 stem in place.  This was a press-fit only, and again we set that in appropriate anteversion, impacted the stem into place.  I selected the +5 trunnion, impacted that on the neck adapter and then the 47 monopolar head.  We reduced the hip, irrigated thoroughly, and then closed the capsule with interrupted #1 Ethibond and then did repair  of the piriformis back to the gluteus medius with 0 Ethibond and then did 0 Vicryl figure-of-eight closure of the tensor fascia lata with the hip abducted.  We irrigated again at each layer and then closed the subcu with 0 and 2-0 Vicryl layered subcutaneous closure and then staples for the skin.  Sterile bandage was applied.  The patient tolerated the procedure well.     Almedia BallsSteven R. Ranell PatrickNorris, M.D.     SRN/MEDQ  D:  04/08/2014  T:  04/09/2014  Job:  [id number]

## 2014-04-09 NOTE — Care Management (Signed)
04/09/14 Patient's field case manager is Cheryl SwazilandJordan RN,CCM with [facility] [phone]. Patient to be evaluated for inpatient rehab. Case Manager will follow.

## 2014-04-09 NOTE — Progress Notes (Signed)
I have contacted Workers comp, Cheryl SwazilandJordan, to discuss rehab venue options. I will follow up in the morning to discuss with pt and family. [phone]

## 2014-04-09 NOTE — Consult Note (Signed)
Physical Medicine and Rehabilitation Consult Reason for Consult: Displaced left femoral neck fracture Referring Physician: Triad   HPI: April [name] is a 80 y.o. right-handed female with history of hypertension, diabetes mellitus with peripheral neuropathy. Patient lives with her daughter and was independent prior to admission. Presented 04/07/2014 after a fall on a wet floor while at work with [employer]. There was no loss of consciousness. X-rays and imaging revealed a displaced left femoral neck fracture. Underwent left hip hemiarthroplasty 04/08/2014 per Dr. Ranell PatrickNorris. Postoperative pain management. Partial weightbearing with posterior hip cautions. Placed on subcutaneous Lovenox for DVT prophylaxis. Physical and occupational therapy evaluations completed with recommendations of physical medicine rehabilitation consult.   Review of Systems  Gastrointestinal: Positive for constipation.  Musculoskeletal: Positive for joint pain and myalgias.  All other systems reviewed and are negative.  Past Medical History  Diagnosis Date  . Hypertension   . Diabetes mellitus without complication   . HTN (hypertension) 04/07/2014  . Hyperlipidemia 04/07/2014  . Hypothyroidism 04/07/2014  . OA (osteoarthritis) 04/07/2014   Past Surgical History  Procedure Laterality Date  . Cholecystectomy     History reviewed. No pertinent family history. Social History:  reports that she has never smoked. She does not have any smokeless tobacco history on file. She reports that she does not drink alcohol or use illicit drugs. Allergies: No Known Allergies Medications Prior to Admission  Medication Sig Dispense Refill  . amLODipine (NORVASC) 10 MG tablet Take 10 mg by mouth daily.      Marland Kitchen. atenolol (TENORMIN) 100 MG tablet Take 100 mg by mouth daily.      Marland Kitchen. gemfibrozil (LOPID) 600 MG tablet Take 600 mg by mouth 2 (two) times daily before a meal.      . ibuprofen (ADVIL,MOTRIN) 200 MG  tablet Take 200-400 mg by mouth See admin instructions. Take 400 mg in the morning and 200 mg at bedtime.      . insulin glargine (LANTUS) 100 UNIT/ML injection Inject 35-45 Units into the skin at bedtime.      Marland Kitchen. levothyroxine (SYNTHROID, LEVOTHROID) 125 MCG tablet Take 125 mcg by mouth daily before breakfast.      . lisinopril (PRINIVIL,ZESTRIL) 40 MG tablet Take 40 mg by mouth daily.      Marland Kitchen. lovastatin (MEVACOR) 40 MG tablet Take 40 mg by mouth daily.        Home: Home Living Family/patient expects to be discharged to:: Skilled nursing facility Living Arrangements: Children Available Help at Discharge: Family;Available 24 hours/day Type of Home: House Home Access: Stairs to enter Enter[employer] CorporationEntrance Stairs-Number of Steps: 3 Entrance Stairs-Rails: Right Home Layout: One level Home Equipment: Walker - 2 wheels;Tub bench;Adaptive equipment Adaptive Equipment: Reacher;Sock aid;Long-handled shoe horn;Long-handled sponge Additional Comments: family member that works for [employer] company provided, pt has not been educated in use  Functional History: Prior Function Level of Independence: Independent Comments: Pt is very active at baseline. Functional Status:  Mobility: Bed Mobility Overal bed mobility:  (not assessed, pt in chair) Bed Mobility: Supine to Sit Supine to sit: Min assist;HOB elevated General bed mobility comments: Min assist with bed mobility for trunk contorl. HOB elevated, needs assist for scooting hips forward. Verbal cues for technique and relies heavily on rail Transfers Overall transfer level: Needs assistance Equipment used: Rolling walker (2 wheeled) Transfers: Sit to/from [employer]Stand;Stand Pivot Transfers Sit to Stand: +2 physical assistance;Mod assist Stand pivot transfers: +2 physical assistance;Mod assist General transfer comment: left knee immobilizer on to  assist with compliance with precautions.      ADL: ADL Overall ADL's : Needs assistance/impaired Eating/Feeding:  Independent;Sitting Grooming: Wash/dry hands;Wash/dry face;Brushing hair;Oral care;Set up;Sitting Upper Body Bathing: Sitting;Minimal assitance Lower Body Bathing: Total assistance;Sit to/from stand;Sitting/lateral leans Upper Body Dressing : Minimal assistance;Sitting Lower Body Dressing: Total assistance;Sitting/lateral leans;Sit to/from stand Toilet Transfer: +2 for physical assistance;Moderate assistance;Stand-pivot;BSC Toileting- Clothing Manipulation and Hygiene: Total assistance;Sit to/from stand Functional mobility during ADLs: +2 for physical assistance;Moderate assistance General ADL Comments: Began instruction in use of AE and DME.  Instructed in hip precautions related to LB ADL.  Cognition: Cognition Overall Cognitive Status: Within Functional Limits for tasks assessed Orientation Level: Oriented to person;Oriented to place;Disoriented to time;Oriented to situation Cognition Arousal/Alertness: Awake/alert Behavior During Therapy: Anxious Overall Cognitive Status: Within Functional Limits for tasks assessed  Blood pressure 132/56, pulse 70, temperature 98 F (36.7 C), temperature source Oral, resp. rate 18, height 5\' 8"  (1.727 m), weight 180 lb (81.647 kg), SpO2 99.00%. Physical Exam  Vitals reviewed. Constitutional: She is oriented to person, place, and time. She appears well-developed.  HENT:  Head: Normocephalic.  Eyes: EOM are normal.  Neck: Normal range of motion. Neck supple. No thyromegaly present.  Cardiovascular: Normal rate and regular rhythm.   Respiratory: Effort normal and breath sounds normal. No respiratory distress.  GI: Soft. Bowel sounds are normal. She exhibits no distension.  Neurological: She is alert and oriented to person, place, and time.  Skin:  Hip incision clean and dry  Psychiatric: She has a normal mood and affect.   motor strength bilateral upper extremities 5/5 in the deltoid, bicep, tricep, grip Right lower extremity 4/5 in the hip  flexor knee extensor ankle dorsiflexor plantar flexor 1/5 left hip flexor 4/5 left ankle dorsiflexor plantar flexor. In knee immobilizer quads not tested Sensory intact to light touch in the upper and lower limbs  Results for orders placed during the hospital encounter of 04/07/14 (from the past 24 hour(s))  GLUCOSE, CAPILLARY     Status: Abnormal   Collection Time    04/08/14  2:51 PM      Result Value Ref Range   Glucose-Capillary 102 (*) 70 - 99 mg/dL  GLUCOSE, CAPILLARY     Status: Abnormal   Collection Time    04/08/14  7:27 PM      Result Value Ref Range   Glucose-Capillary 119 (*) 70 - 99 mg/dL  GLUCOSE, CAPILLARY     Status: Abnormal   Collection Time    04/08/14 10:27 PM      Result Value Ref Range   Glucose-Capillary 136 (*) 70 - 99 mg/dL  CBC     Status: Abnormal   Collection Time    04/09/14  5:00 AM      Result Value Ref Range   WBC 10.6 (*) 4.0 - 10.5 K/uL   RBC 3.36 (*) 3.87 - 5.11 MIL/uL   Hemoglobin 11.0 (*) 12.0 - 15.0 g/dL   HCT 16.132.2 (*) 09.636.0 - 04.546.0 %   MCV 95.8  78.0 - 100.0 fL   MCH 32.7  26.0 - 34.0 pg   MCHC 34.2  30.0 - 36.0 g/dL   RDW 40.914.1  81.111.5 - 91.415.5 %   Platelets 163  150 - 400 K/uL  BASIC METABOLIC PANEL     Status: Abnormal   Collection Time    04/09/14  5:00 AM      Result Value Ref Range   Sodium 143  137 - 147 mEq/L   Potassium  4.2  3.7 - 5.3 mEq/L   Chloride 110  96 - 112 mEq/L   CO2 20  19 - 32 mEq/L   Glucose, Bld 124 (*) 70 - 99 mg/dL   BUN 15  6 - 23 mg/dL   Creatinine, Ser 1.61  0.50 - 1.10 mg/dL   Calcium 8.6  8.4 - 09.6 mg/dL   GFR calc non Af Amer 63 (*) >90 mL/min   GFR calc Af Amer 73 (*) >90 mL/min  GLUCOSE, CAPILLARY     Status: Abnormal   Collection Time    04/09/14  7:07 AM      Result Value Ref Range   Glucose-Capillary 106 (*) 70 - 99 mg/dL  GLUCOSE, CAPILLARY     Status: Abnormal   Collection Time    04/09/14 11:03 AM      Result Value Ref Range   Glucose-Capillary 134 (*) 70 - 99 mg/dL   Comment 1 Notify RN      Comment 2 Documented in Chart     Dg Pelvis Portable  04/08/2014   CLINICAL DATA:  Postop left hip hemi arthroplasty.  EXAM: PORTABLE PELVIS 1-2 VIEWS  COMPARISON:  None.  FINDINGS: Hemi arthroplasty on the left has a good appearance. Prosthetic components appear well positioned. No fracture. No other finding.  IMPRESSION: Good appearance following a left hemiarthroplasty.   Electronically Signed   By: Paulina Fusi M.D.   On: 04/08/2014 20:29    Assessment/Plan: Diagnosis: Left femoral neck fracture status post fall 04/07/2014 1. Does the need for close, 24 hr/day medical supervision in concert with the patient's rehab needs make it unreasonable for this patient to be served in a less intensive setting? Yes 2. Co-Morbidities requiring supervision/potential complications: Hypertension, diabetes 3. Due to bladder management, bowel management, safety, skin/wound care, disease management, medication administration and pain management, does the patient require 24 hr/day rehab nursing? Yes 4. Does the patient require coordinated care of a physician, rehab nurse, PT (1-2 hrs/day, 5 days/week) and OT (1-2 hrs/day, 5 days/week) to address physical and functional deficits in the context of the above medical diagnosis(es)? Yes Addressing deficits in the following areas: balance, endurance, locomotion, strength, transferring, bowel/bladder control, bathing, dressing, feeding and toileting 5. Can the patient actively participate in an intensive therapy program of at least 3 hrs of therapy per day at least 5 days per week? Yes 6. The potential for patient to make measurable gains while on inpatient rehab is good 7. Anticipated functional outcomes upon discharge from inpatient rehab are modified independent  with PT, modified independent with OT, n/a with SLP. 8. Estimated rehab length of stay to reach the above functional goals is: 10-12 days 9. Does the patient have adequate social supports to accommodate  these discharge functional goals? Yes 10. Anticipated D/C setting: Home 11. Anticipated post D/C treatments: HH therapy 12. Overall Rehab/Functional Prognosis: excellent  RECOMMENDATIONS: This patient's condition is appropriate for continued rehabilitative care in the following setting: CIR Patient has agreed to participate in recommended program. Yes Note that insurance prior authorization may be required for reimbursement for recommended care.  Comment:      04/09/2014

## 2014-04-09 NOTE — Evaluation (Signed)
Physical Therapy Evaluation Patient Details Name: April MuldersValerie L Vayda MRN: [id number] DOB: 12/16/1925 Today's Date: 04/09/2014   History of Present Illness  Left hip hemiarthroplasty following a fall and resulting fracture.  Clinical Impression  Patient is s/p left hemiarthroplasty following a mechanical fall and resulting displaced femoral neck fracture. This resulted in functional limitations due to the deficits listed below (see PT Problem List). Pt was independent with ADL's prior to injury and is motivated to return home with strong family support. Recommend leaving knee immobilizer in place for transfers at this time due to inability to maintain safe hip precautions. Patient will benefit from skilled PT to increase their independence and safety with mobility to allow discharge to the venue listed below.     Follow Up Recommendations Supervision/Assistance - 24 hour;CIR    Equipment Recommendations  Rolling walker with 5" wheels;3in1 (PT)    Recommendations for Other Services Rehab consult     Precautions / Restrictions Precautions Precautions: Posterior Hip;Fall Precaution Booklet Issued: Yes (comment) Precaution Comments: Reviewed precautions Required Braces or Orthoses: Knee Immobilizer - Left Knee Immobilizer - Left: Other (comment) (D/c when compliant with precautions) Restrictions Weight Bearing Restrictions: Yes LLE Weight Bearing: Partial weight bearing LLE Partial Weight Bearing Percentage or Pounds: 50%      Mobility  Bed Mobility Overal bed mobility: Needs Assistance Bed Mobility: Supine to Sit     Supine to sit: Min assist;HOB elevated     General bed mobility comments: Min assist with bed mobility for trunk contorl. HOB elevated, needs assist for scooting hips forward. Verbal cues for technique and relies heavily on rail  Transfers Overall transfer level: Needs assistance Equipment used: Rolling walker (2 wheeled) Transfers: Sit to/from [employer]. [employer] BerkleyStand;Stand  Pivot Transfers Sit to Stand: Mod assist;+2 physical assistance;From elevated surface Stand pivot transfers: Max assist;+2 physical assistance       General transfer comment: Pt able to stand with Mod assist +2 from elevated surface. Physical assist for power-up lift. Frequent cues for precautions. Performed sit<>stand x 2 with and without knee immobilizer for hip precautions. Pt adducts L hip and unable to safely perform at this time without knee immobilizer. Max assist for pivot transfer. Unable to take step with LLE, requires physical assist to advance limb. Poor ability to use UEs to unload weight and advance RLE. Pt appears to adhear to 50% WB status on LLE. Frequent verbal cues for sequencing, Physical assist +2 to maneuver RW and assist pt with turning for transfer. Overall unable to safely perform transfer without Knee immobilizer to protect hip.  Ambulation/Gait                Stairs            Wheelchair Mobility    Modified Rankin (Stroke Patients Only)       Balance Overall balance assessment: Needs assistance Sitting-balance support: Single extremity supported Sitting balance-Leahy Scale: Poor Sitting balance - Comments: Sits EOB without assist, needs 1 UE for support   Standing balance support: Bilateral upper extremity supported Standing balance-Leahy Scale: Poor Standing balance comment: Pt needs mod assist to stand with bil UE support on RW                             Pertinent Vitals/Pain 7/10 pain Nurse notified Pt repositioned in chair for comfort    Home Living Family/patient expects to be discharged to:: Skilled nursing facility Living Arrangements: Children Available Help at Discharge: Family;Available  24 hours/day Type of Home: House Home Access: Stairs to enter Entrance Stairs-Rails: Right Entrance Stairs-Number of Steps: 3 Home Layout: One level        Prior Function Level of Independence: Independent                Hand Dominance   Dominant Hand: Right    Extremity/Trunk Assessment   Upper Extremity Assessment: Defer to OT evaluation           Lower Extremity Assessment: LLE deficits/detail   LLE Deficits / Details: Decreased strength and ROM     Communication   Communication: No difficulties  Cognition Arousal/Alertness: Awake/alert Behavior During Therapy: WFL for tasks assessed/performed Overall Cognitive Status: Within Functional Limits for tasks assessed                      General Comments General comments (skin integrity, edema, etc.): Reviewed precautions and WB status for LLE. Discussed and answered questions concerning mobility and discharge plans.    Exercises Total Joint Exercises Ankle Circles/Pumps: AROM;Both;10 reps Quad Sets: AROM;Both;10 reps;Seated Gluteal Sets: AROM;Both;10 reps;Seated      Assessment/Plan    PT Assessment Patient needs continued PT services  PT Diagnosis Difficulty walking;Abnormality of gait;Generalized weakness;Acute pain   PT Problem List Decreased strength;Decreased range of motion;Decreased activity tolerance;Decreased balance;Decreased mobility;Decreased knowledge of use of DME;Decreased knowledge of precautions;Pain  PT Treatment Interventions DME instruction;Gait training;Stair training;Functional mobility training;Therapeutic activities;Therapeutic exercise;Balance training;Neuromuscular re-education;Patient/family education;Modalities   PT Goals (Current goals can be found in the Care Plan section) Acute Rehab PT Goals Patient Stated Goal: Go home PT Goal Formulation: With patient Time For Goal Achievement: 04/16/14 Potential to Achieve Goals: Good    Frequency Min 3X/week   Barriers to discharge Inaccessible home environment;Other (comment) (Pt mobility status unsafe for daughter to assist) Pt has steps >9" to enter house. Pt mobility status unsafe for daughter to assist with    Co-evaluation                End of Session Equipment Utilized During Treatment: Gait belt Activity Tolerance: Patient tolerated treatment well Patient left: in chair;with call bell/phone within reach;with family/visitor present Nurse Communication: Mobility status         Time: [id number] PT Time Calculation (min): 28 min   Charges:   PT Evaluation $Initial PT Evaluation Tier I: 1 Procedure PT Treatments $Therapeutic Activity: 8-22 mins   PT G CodesSunday Spillers:        Judas Mohammad Secor [place], [place]PT [phone]   Berton MountLogan S Kimbella Heisler 04/09/2014, 12:08 PM

## 2014-04-09 NOTE — Progress Notes (Signed)
Rehab Admissions Coordinator Note:  Patient was screened by Trish MageEugenia M Zymire Turnbo for appropriateness for an Inpatient Acute Rehab Consult.  At this time, an inpatient rehab consult has already been ordered.  We will follow up once consult is completed.  Ellouise Newerugenia M Ronnesha Mester 04/09/2014, 1:34 PM  I can be reached at [phone].

## 2014-04-09 NOTE — Progress Notes (Signed)
PROGRESS NOTE  April MuldersValerie L [name] RUE:[id number] DOB: 06/19/1925 DOA: 04/07/2014 PCP: Juliette AlcideBURDINE,STEVEN E, MD  Assessment/Plan: left displaced femoral neck fracture  Secondary to mechanical fall.  surgery s/p.  Orthopedic consultation  PT-- ? SNF   hypertension  Stable. Continue home regimen of atenolol, lisinopril.    diabetes mellitus  half home dose of Lantus as patient is to have surgery tomorrow and be n.p.o. after midnight. sliding scale insulin.   hypothyroidism  TSH ok home dose Synthroid.   Hypokalemia -replete   hyperlipidemia  Continue statin.   urinary tract infection  Gram neg rod IV Rocephin.    dehydration  IV fluids.   Code Status: full Family Communication: patient Disposition Plan: prob SNF after surgery   Consultants:  ortho  Procedures:      HPI/Subjective: Pain controlled  Objective: Filed Vitals:   04/09/14 0621  BP: 132/56  Pulse: 70  Temp: 98 F (36.7 C)  Resp: 18    Intake/Output Summary (Last 24 hours) at 04/09/14 1040 Last data filed at 04/09/14 0500  Gross per 24 hour  Intake   1500 ml  Output   2300 ml  Net   -800 ml   Filed Weights   04/07/14 0902  Weight: 81.647 kg (180 lb)    Exam:   General:  A+Ox3, NAD  Cardiovascular: rrr  Respiratory: clear anterior  Abdomen: +BS, soft  Musculoskeletal: no edema  Data Reviewed: Basic Metabolic Panel:  Recent Labs Lab 04/07/14 1110 04/08/14 0414 04/09/14 0500  NA 142 141 143  K 4.1 3.6* 4.2  CL 107 109 110  CO2 22 20 20   GLUCOSE 141* 136* 124*  BUN 22 18 15   CREATININE 0.84 0.83 0.82  CALCIUM 8.9 8.4 8.6   Liver Function Tests:  Recent Labs Lab 04/07/14 1110  AST 12  ALT <5  ALKPHOS 87  BILITOT 0.3  PROT 6.8  ALBUMIN 3.3*   No results found for this basename: LIPASE, AMYLASE,  in the last 168 hours No results found for this basename: AMMONIA,  in the last 168 hours CBC:  Recent Labs Lab 04/07/14 1110 04/08/14 0414 04/09/14 0500   WBC 10.0 8.8 10.6*  NEUTROABS 8.2*  --   --   HGB 12.7 11.4* 11.0*  HCT 36.6 33.2* 32.2*  MCV 94.8 96.5 95.8  PLT 180 161 163   Cardiac Enzymes: No results found for this basename: CKTOTAL, CKMB, CKMBINDEX, TROPONINI,  in the last 168 hours BNP (last 3 results) No results found for this basename: PROBNP,  in the last 8760 hours CBG:  Recent Labs Lab 04/08/14 1140 04/08/14 1451 04/08/14 1927 04/08/14 2227 04/09/14 0707  GLUCAP 117* 102* 119* 136* 106*    Recent Results (from the past 240 hour(s))  URINE CULTURE     Status: None   Collection Time    04/07/14 10:52 AM      Result Value Ref Range Status   Specimen Description URINE, CATHETERIZED   Final   Special Requests NONE   Final   Culture  Setup Time     Final   Value: 04/07/2014 14:54     Performed at [facility]   Colony Count     Final   Value: >=100,000 COLONIES/ML     Performed at [facility]   Culture     Final   Value: GRAM NEGATIVE RODS     Performed at [facility]   Report Status PENDING   Incomplete  SURGICAL PCR SCREEN  Status: None   Collection Time    04/08/14  3:44 AM      Result Value Ref Range Status   MRSA, PCR NEGATIVE  NEGATIVE Final   Staphylococcus aureus NEGATIVE  NEGATIVE Final   Comment:            The Xpert SA Assay (FDA     approved for NASAL specimens     in patients over 78 years of age),     is one component of     a comprehensive surveillance     program.  Test performance has     been validated by [facility] for patients greater     than or equal to 78 year old.     It is not intended     to diagnose infection nor to     guide or monitor treatment.     Studies: Dg Pelvis Portable  04/08/2014   CLINICAL DATA:  Postop left hip hemi arthroplasty.  EXAM: PORTABLE PELVIS 1-2 VIEWS  COMPARISON:  None.  FINDINGS: Hemi arthroplasty on the left has a good appearance. Prosthetic components appear well positioned. No fracture. No other finding.   IMPRESSION: Good appearance following a left hemiarthroplasty.   Electronically Signed   By: Paulina FusiMark  Shogry M.D.   On: 04/08/2014 20:29    Scheduled Meds: . amLODipine  10 mg Oral Daily  . atenolol  100 mg Oral Daily  . atorvastatin  10 mg Oral q1800  . cefTRIAXone (ROCEPHIN)  IV  1 g Intravenous Q24H  . docusate sodium  100 mg Oral BID  . enoxaparin (LOVENOX) injection  30 mg Subcutaneous Q24H  . gemfibrozil  600 mg Oral BID AC  . insulin aspart  0-15 Units Subcutaneous TID WC  . insulin glargine  15 Units Subcutaneous QHS  . levothyroxine  125 mcg Oral QAC breakfast  . lisinopril  40 mg Oral Daily  . pantoprazole  40 mg Oral Daily   Continuous Infusions: . sodium chloride 50 mL/hr at 04/08/14 2057  . lactated ringers 50 mL/hr at 04/08/14 1508  . sodium chloride 0.9 % 1,000 mL infusion     Antibiotics Given (last 72 hours)   Date/Time Action Medication Dose Rate   04/07/14 1400 Given   cefTRIAXone (ROCEPHIN) 1 g in dextrose 5 % 50 mL IVPB 1 g 100 mL/hr   04/07/14 1624 Given  [First dose given by ED.]   cefTRIAXone (ROCEPHIN) 1 g in dextrose 5 % 50 mL IVPB 1 g 100 mL/hr   04/08/14 2332 Given   ceFAZolin (ANCEF) IVPB 2 g/50 mL premix 2 g 100 mL/hr      Principal Problem:   Left displaced femoral neck fracture Active Problems:   HTN (hypertension)   Hyperlipidemia   DM (diabetes mellitus)   Hypothyroidism   OA (osteoarthritis)   UTI (lower urinary tract infection)   Dehydration   Fracture of femoral neck, left    Time spent: 35    Joseph ArtJessica U Vann  [facility] Pager [phone]. If 7PM-7AM, please contact night-coverage at [email or web address], password [email or web address] 04/09/2014, 10:40 AM  LOS: 2 days

## 2014-04-09 NOTE — Progress Notes (Signed)
Orthopedics Progress Note  Subjective: My hip is sore, but I am doing ok.  Objective:  Filed Vitals:   04/08/14 2124  BP: 140/74  Pulse: 74  Temp: 99.6 F (37.6 C)  Resp: 18    General: Awake and alert  Musculoskeletal: Left hip dressing intact, min swelling Neurovascularly intact  Lab Results  Component Value Date   WBC 10.6* 04/09/2014   HGB 11.0* 04/09/2014   HCT 32.2* 04/09/2014   MCV 95.8 04/09/2014   PLT 163 04/09/2014       Component Value Date/Time   NA 141 04/08/2014 0414   K 3.6* 04/08/2014 0414   CL 109 04/08/2014 0414   CO2 20 04/08/2014 0414   GLUCOSE 136* 04/08/2014 0414   BUN 18 04/08/2014 0414   CREATININE 0.83 04/08/2014 0414   CALCIUM 8.4 04/08/2014 0414   GFRNONAA 62* 04/08/2014 0414   GFRAA 72* 04/08/2014 0414    Lab Results  Component Value Date   INR 1.06 04/07/2014    Assessment/Plan: POD #1 s/p Procedure(s): LEFT HIP HEMIARTHROPLASTY Stable overnight.  XRAY shows hemiarthroplasty in good position. Mobilization with PT,OT 50% WB on left LE, posterior hip precautions Patient has Stage I decub on sacrum - decub pad in place, turn patient and prop heels DVT prophylaxis: mechanical and Lovenox Will need short term SNF rehab - D/C planning  Almedia BallsSteven R. Ranell PatrickNorris, MD 04/09/2014 6:03 AM

## 2014-04-10 ENCOUNTER — Inpatient Hospital Stay (HOSPITAL_COMMUNITY)
Admission: AD | Admit: 2014-04-10 | Discharge: 2014-04-23 | DRG: 945 | Disposition: A | Discharge status: Home Health | Payer: Worker's Compensation | Source: Intra-hospital | Attending: Physical Medicine & Rehabilitation | Admitting: Physical Medicine & Rehabilitation

## 2014-04-10 ENCOUNTER — Inpatient Hospital Stay (HOSPITAL_COMMUNITY): Payer: Worker's Compensation

## 2014-04-10 ENCOUNTER — Encounter (HOSPITAL_COMMUNITY): Payer: Self-pay | Admitting: Orthopedic Surgery

## 2014-04-10 DIAGNOSIS — E785 Hyperlipidemia, unspecified: Secondary | ICD-10-CM | POA: Diagnosis present

## 2014-04-10 DIAGNOSIS — E119 Type 2 diabetes mellitus without complications: Secondary | ICD-10-CM

## 2014-04-10 DIAGNOSIS — N390 Urinary tract infection, site not specified: Secondary | ICD-10-CM | POA: Diagnosis present

## 2014-04-10 DIAGNOSIS — R296 Repeated falls: Secondary | ICD-10-CM | POA: Diagnosis present

## 2014-04-10 DIAGNOSIS — I10 Essential (primary) hypertension: Secondary | ICD-10-CM

## 2014-04-10 DIAGNOSIS — K5900 Constipation, unspecified: Secondary | ICD-10-CM | POA: Diagnosis present

## 2014-04-10 DIAGNOSIS — W19XXXA Unspecified fall, initial encounter: Secondary | ICD-10-CM

## 2014-04-10 DIAGNOSIS — S72009A Fracture of unspecified part of neck of unspecified femur, initial encounter for closed fracture: Secondary | ICD-10-CM

## 2014-04-10 DIAGNOSIS — B965 Pseudomonas (aeruginosa) (mallei) (pseudomallei) as the cause of diseases classified elsewhere: Secondary | ICD-10-CM | POA: Diagnosis present

## 2014-04-10 DIAGNOSIS — Z5189 Encounter for other specified aftercare: Principal | ICD-10-CM

## 2014-04-10 DIAGNOSIS — D62 Acute posthemorrhagic anemia: Secondary | ICD-10-CM | POA: Diagnosis present

## 2014-04-10 DIAGNOSIS — E1149 Type 2 diabetes mellitus with other diabetic neurological complication: Secondary | ICD-10-CM | POA: Diagnosis present

## 2014-04-10 DIAGNOSIS — E039 Hypothyroidism, unspecified: Secondary | ICD-10-CM

## 2014-04-10 DIAGNOSIS — Z794 Long term (current) use of insulin: Secondary | ICD-10-CM

## 2014-04-10 DIAGNOSIS — S72009D Fracture of unspecified part of neck of unspecified femur, subsequent encounter for closed fracture with routine healing: Secondary | ICD-10-CM

## 2014-04-10 DIAGNOSIS — M1990 Unspecified osteoarthritis, unspecified site: Secondary | ICD-10-CM | POA: Diagnosis present

## 2014-04-10 DIAGNOSIS — E1142 Type 2 diabetes mellitus with diabetic polyneuropathy: Secondary | ICD-10-CM | POA: Diagnosis present

## 2014-04-10 DIAGNOSIS — Z79899 Other long term (current) drug therapy: Secondary | ICD-10-CM

## 2014-04-10 LAB — GLUCOSE, CAPILLARY
Glucose-Capillary: 150 mg/dL — ABNORMAL HIGH (ref 70–99)
Glucose-Capillary: 200 mg/dL — ABNORMAL HIGH (ref 70–99)
Glucose-Capillary: 208 mg/dL — ABNORMAL HIGH (ref 70–99)
Glucose-Capillary: 192 mg/dL — ABNORMAL HIGH (ref 70–99)

## 2014-04-10 LAB — CBC
HCT: 29.9 % — ABNORMAL LOW (ref 36.0–46.0)
Hemoglobin: 10.3 g/dL — ABNORMAL LOW (ref 12.0–15.0)
MCH: 33.3 pg (ref 26.0–34.0)
MCHC: 34.4 g/dL (ref 30.0–36.0)
MCV: 96.8 fL (ref 78.0–100.0)
PLATELETS: 148 10*3/uL — AB (ref 150–400)
RBC: 3.09 MIL/uL — ABNORMAL LOW (ref 3.87–5.11)
RDW: 14.5 % (ref 11.5–15.5)
WBC: 9.3 10*3/uL (ref 4.0–10.5)

## 2014-04-10 LAB — BASIC METABOLIC PANEL
BUN: 21 mg/dL (ref 6–23)
CO2: 22 mEq/L (ref 19–32)
Calcium: 8.6 mg/dL (ref 8.4–10.5)
Chloride: 106 mEq/L (ref 96–112)
Creatinine, Ser: 1 mg/dL (ref 0.50–1.10)
GFR, EST AFRICAN AMERICAN: 57 mL/min — AB (ref >90)
GFR, EST NON AFRICAN AMERICAN: 50 mL/min — AB (ref >90)
Glucose, Bld: 141 mg/dL — ABNORMAL HIGH (ref 70–99)
POTASSIUM: 4.1 meq/L (ref 3.7–5.3)
SODIUM: 139 meq/L (ref 137–147)

## 2014-04-10 MED ORDER — INSULIN ASPART 100 UNIT/ML ~~LOC~~ SOLN
0.0000 [IU] | Freq: Three times a day (TID) | SUBCUTANEOUS | Status: DC
  Administered 2014-04-10: 5 [IU] via SUBCUTANEOUS
  Administered 2014-04-11: 2 [IU] via SUBCUTANEOUS
  Administered 2014-04-11: 3 [IU] via SUBCUTANEOUS
  Administered 2014-04-12: 2 [IU] via SUBCUTANEOUS
  Administered 2014-04-12: 3 [IU] via SUBCUTANEOUS
  Administered 2014-04-12 – 2014-04-13 (×3): 2 [IU] via SUBCUTANEOUS
  Administered 2014-04-13: 3 [IU] via SUBCUTANEOUS
  Administered 2014-04-14: 2 [IU] via SUBCUTANEOUS
  Administered 2014-04-14: 3 [IU] via SUBCUTANEOUS
  Administered 2014-04-14 – 2014-04-15 (×3): 2 [IU] via SUBCUTANEOUS
  Administered 2014-04-15: 3 [IU] via SUBCUTANEOUS
  Administered 2014-04-16 – 2014-04-23 (×8): 2 [IU] via SUBCUTANEOUS

## 2014-04-10 MED ORDER — LISINOPRIL 40 MG PO TABS
40.0000 mg | ORAL_TABLET | Freq: Every day | ORAL | Status: DC
  Administered 2014-04-11 – 2014-04-23 (×13): 40 mg via ORAL
  Filled 2014-04-10 (×14): qty 1

## 2014-04-10 MED ORDER — ACETAMINOPHEN 325 MG PO TABS
325.0000 mg | ORAL_TABLET | ORAL | Status: DC | PRN
  Administered 2014-04-17 – 2014-04-20 (×3): 650 mg via ORAL
  Filled 2014-04-10 (×2): qty 2

## 2014-04-10 MED ORDER — ENOXAPARIN SODIUM 30 MG/0.3ML ~~LOC~~ SOLN
30.0000 mg | Freq: Two times a day (BID) | SUBCUTANEOUS | Status: DC
  Administered 2014-04-10 – 2014-04-22 (×25): 30 mg via SUBCUTANEOUS
  Filled 2014-04-10 (×28): qty 0.3

## 2014-04-10 MED ORDER — ONDANSETRON HCL 4 MG PO TABS: 4.0000 mg | ORAL_TABLET | Freq: Four times a day (QID) | ORAL | Status: DC | PRN

## 2014-04-10 MED ORDER — ATORVASTATIN CALCIUM 10 MG PO TABS
10.0000 mg | ORAL_TABLET | Freq: Every day | ORAL | Status: DC
  Administered 2014-04-10 – 2014-04-22 (×13): 10 mg via ORAL
  Filled 2014-04-10 (×14): qty 1

## 2014-04-10 MED ORDER — POLYETHYLENE GLYCOL 3350 17 G PO PACK
17.0000 g | PACK | Freq: Every day | ORAL | Status: DC | PRN
  Administered 2014-04-12 – 2014-04-14 (×3): 17 g via ORAL
  Filled 2014-04-10: qty 1

## 2014-04-10 MED ORDER — ONDANSETRON HCL 4 MG/2ML IJ SOLN: 4.0000 mg | Freq: Four times a day (QID) | INTRAMUSCULAR | Status: DC | PRN

## 2014-04-10 MED ORDER — INSULIN GLARGINE 100 UNIT/ML ~~LOC~~ SOLN
20.0000 [IU] | Freq: Every day | SUBCUTANEOUS | Status: DC
  Administered 2014-04-10 – 2014-04-22 (×13): 20 [IU] via SUBCUTANEOUS
  Filled 2014-04-10 (×14): qty 0.2

## 2014-04-10 MED ORDER — FENOFIBRATE 160 MG PO TABS
160.0000 mg | ORAL_TABLET | Freq: Every day | ORAL | Status: DC
  Administered 2014-04-11 – 2014-04-23 (×13): 160 mg via ORAL
  Filled 2014-04-10 (×14): qty 1

## 2014-04-10 MED ORDER — LEVOTHYROXINE SODIUM 125 MCG PO TABS
125.0000 ug | ORAL_TABLET | Freq: Every day | ORAL | Status: DC
  Administered 2014-04-11 – 2014-04-23 (×13): 125 ug via ORAL
  Filled 2014-04-10 (×14): qty 1

## 2014-04-10 MED ORDER — CIPROFLOXACIN HCL 500 MG PO TABS
500.0000 mg | ORAL_TABLET | Freq: Two times a day (BID) | ORAL | Status: DC
  Administered 2014-04-10: 500 mg via ORAL
  Filled 2014-04-10 (×3): qty 1

## 2014-04-10 MED ORDER — FENOFIBRATE 160 MG PO TABS
160.0000 mg | ORAL_TABLET | Freq: Every day | ORAL | Status: DC
  Administered 2014-04-10: 160 mg via ORAL
  Filled 2014-04-10: qty 1

## 2014-04-10 MED ORDER — ATENOLOL 100 MG PO TABS
100.0000 mg | ORAL_TABLET | Freq: Every day | ORAL | Status: DC
  Administered 2014-04-11 – 2014-04-23 (×13): 100 mg via ORAL
  Filled 2014-04-10 (×14): qty 1

## 2014-04-10 MED ORDER — OXYCODONE HCL 5 MG PO TABS
5.0000 mg | ORAL_TABLET | ORAL | Status: DC | PRN
  Administered 2014-04-10 – 2014-04-19 (×30): 10 mg via ORAL
  Administered 2014-04-19: 5 mg via ORAL
  Administered 2014-04-20 – 2014-04-23 (×15): 10 mg via ORAL
  Filled 2014-04-10 (×47): qty 2

## 2014-04-10 MED ORDER — INSULIN GLARGINE 100 UNIT/ML ~~LOC~~ SOLN
20.0000 [IU] | Freq: Every day | SUBCUTANEOUS | Status: DC
  Filled 2014-04-10: qty 0.2

## 2014-04-10 MED ORDER — CIPROFLOXACIN HCL 500 MG PO TABS
500.0000 mg | ORAL_TABLET | Freq: Two times a day (BID) | ORAL | Status: DC
  Administered 2014-04-10 – 2014-04-23 (×26): 500 mg via ORAL
  Filled 2014-04-10 (×28): qty 1

## 2014-04-10 MED ORDER — ENOXAPARIN SODIUM 30 MG/0.3ML ~~LOC~~ SOLN: 30.0000 mg | SUBCUTANEOUS | Status: DC

## 2014-04-10 MED ORDER — SORBITOL 70 % SOLN
30.0000 mL | Freq: Every day | Status: DC | PRN
Start: 2014-04-10 — End: 2014-04-23
  Administered 2014-04-11 – 2014-04-15 (×2): 30 mL via ORAL
  Filled 2014-04-10 (×4): qty 30

## 2014-04-10 MED ORDER — AMLODIPINE BESYLATE 10 MG PO TABS
10.0000 mg | ORAL_TABLET | Freq: Every day | ORAL | Status: DC
  Administered 2014-04-11 – 2014-04-23 (×13): 10 mg via ORAL
  Filled 2014-04-10 (×14): qty 1

## 2014-04-10 MED ORDER — PANTOPRAZOLE SODIUM 40 MG PO TBEC
40.0000 mg | DELAYED_RELEASE_TABLET | Freq: Every day | ORAL | Status: DC
  Administered 2014-04-11 – 2014-04-23 (×13): 40 mg via ORAL
  Filled 2014-04-10 (×13): qty 1

## 2014-04-10 MED ORDER — DOCUSATE SODIUM 100 MG PO CAPS
100.0000 mg | ORAL_CAPSULE | Freq: Two times a day (BID) | ORAL | Status: DC
  Administered 2014-04-10 – 2014-04-23 (×26): 100 mg via ORAL
  Filled 2014-04-10 (×28): qty 1

## 2014-04-10 NOTE — Progress Notes (Signed)
Patient unable to spontaneously void. IN and out cath performed  2 times throughout the night. Bladder scan performed revealing of urine. Per protocol will insert foley catheter as this would be the 3rd in and out cath. Will still prepare for discharge despite insertion of foley catheter.

## 2014-04-10 NOTE — Progress Notes (Signed)
Physical Therapy Treatment Patient Details Name: April MuldersValerie L [name] [name]: [id number] DOB: 10/31/1925 Today's Date: 04/10/2014    History of Present Illness Left hip hemiarthroplasty following a fall and resulting fracture.    PT Comments    Pt progressing with physical therapy, able to ambulate up to 6 feet today with moderate assist +2 (A significant improvement from yesterday.) Pt continues to need L knee immobilizer in place in order to safely transfer/ambulate without breaking posterior hip precautions. Acute PT will continue to follow until d/c focusing on decreasing burden of care for next venue of care. Continues to be a great candidate for CIR to improve functional mobility and return to prior level of independence.  Follow Up Recommendations  Supervision/Assistance - 24 hour;CIR     Equipment Recommendations  Rolling walker with 5" wheels;3in1 (PT)    Recommendations for Other Services Rehab consult     Precautions / Restrictions Precautions Precautions: Posterior Hip;Fall Precaution Booklet Issued: Yes (comment) Precaution Comments: Reviewed precautions Required Braces or Orthoses: Knee Immobilizer - Left Knee Immobilizer - Left: Other (comment) (D/c when compliant with precautions) Restrictions Weight Bearing Restrictions: Yes LLE Weight Bearing: Partial weight bearing LLE Partial Weight Bearing Percentage or Pounds: 50%    Mobility  Bed Mobility Overal bed mobility: Needs Assistance Bed Mobility: Supine to Sit     Supine to sit: Min assist;HOB elevated;+2 for physical assistance     General bed mobility comments: Min assist with bed mobility for trunk control. HOB elevated, needs assist for scooting hips forward. Pt with posterior lean until at EOB. Verbal cues for technique and relies heavily on rail  Transfers Overall transfer level: Needs assistance Equipment used: Rolling walker (2 wheeled) Transfers: Sit to/from Stand Sit to Stand: Mod assist;+2  physical assistance;From elevated surface         General transfer comment: Mod assist for sit<>stand with power up to lift. Pt needs verbal cues for hand placement and pulls on RW with opposite hand. Overall, improved ability from yesterday. Cues for upright posture when in standing position and to bring hips forward. Requires extra time and leans heavily over walker initially. Sit<>stand attempted from bed and bsc.  Ambulation/Gait Ambulation/Gait assistance: Mod assist;+2 physical assistance Ambulation Distance (Feet): 6 Feet Assistive device: Rolling walker (2 wheeled) Gait Pattern/deviations: Step-to pattern;Decreased step length - right;Decreased step length - left;Decreased stance time - left;Decreased weight shift to left;Antalgic     General Gait Details: Pt able to ambulate up to 6 feet today before needing a seated rest break. Improved ability to use UEs for support. Appears to maintain 50% WB status with knee immobilizer in place.   Stairs            Wheelchair Mobility    Modified Rankin (Stroke Patients Only)       Balance                                    Cognition Arousal/Alertness: Awake/alert Behavior During Therapy: WFL for tasks assessed/performed Overall Cognitive Status: Within Functional Limits for tasks assessed                      Exercises      General Comments General comments (skin integrity, edema, etc.): Pt recalls 2/3 posterior hip precautions. Is aware of 50% WB status and was able to maintain this status with 6 feet of ambulation while wearing knee immobilizer to  prevent breaking hip precautions      Pertinent Vitals/Pain 5/10 pain Nurse notified Pt repositioned in chair for comfort SpO2 90% on room air  SpO2 94% 1L supplemental oxygen    Home Living                      Prior Function            PT Goals (current goals can now be found in the care plan section) Acute Rehab PT  Goals Patient Stated Goal: Go home PT Goal Formulation: With patient Time For Goal Achievement: 04/16/14 Potential to Achieve Goals: Good Progress towards PT goals: Progressing toward goals    Frequency  Min 3X/week    PT Plan Current plan remains appropriate    Co-evaluation             End of Session Equipment Utilized During Treatment: Gait belt Activity Tolerance: Patient tolerated treatment well Patient left: in chair;with call bell/phone within reach;with family/visitor present     Time: [id number] PT Time Calculation (min): 18 min  Charges:  $Gait Training: 8-22 mins                    G Codes:      Sunday SpillersLogan Secor Pe[place], [place] CarolinaPT [phone]  Berton MountLogan S Francis Yardley 04/10/2014, 1:32 PM

## 2014-04-10 NOTE — Progress Notes (Signed)
PROGRESS NOTE  April MuldersValerie L Auston NWG:[id number] DOB: 10/25/1925 DOA: 04/07/2014 PCP: Juliette AlcideBURDINE,STEVEN E, MD  Assessment/Plan: left displaced femoral neck fracture  Secondary to mechanical fall.  surgery s/p.  Orthopedic consultation  PT-- ? SNF  Acute resp failure- -wean off O2 -check 2 view to assess if volume overloaded   hypertension  Stable. Continue home regimen of atenolol, lisinopril.    diabetes mellitus  half home dose of Lantus as patient is to have surgery tomorrow and be n.p.o. after midnight. sliding scale insulin.   hypothyroidism  TSH ok home dose Synthroid.   Hypokalemia -replete   hyperlipidemia  Continue statin.   urinary tract infection- psuedomonas Change to PO: cipro   dehydration  D/c fluid   Code Status: full Family Communication: patient Disposition Plan: CIR once bed approved   Consultants:  ortho  Procedures:      HPI/Subjective: No overnight events  Objective: Filed Vitals:   04/10/14 0500  BP: 133/41  Pulse: 62  Temp: 97.7 F (36.5 C)  Resp: 18    Intake/Output Summary (Last 24 hours) at 04/10/14 0906 Last data filed at 04/10/14 0600  Gross per 24 hour  Intake 1472.5 ml  Output   1400 ml  Net   72.5 ml   Filed Weights   04/07/14 0902  Weight: 81.647 kg (180 lb)    Exam:   General:  A+Ox3, NAD  Cardiovascular: rrr  Respiratory: clear anterior  Abdomen: +BS, soft  Musculoskeletal: no edema  Data Reviewed: Basic Metabolic Panel:  Recent Labs Lab 04/07/14 1110 04/08/14 0414 04/09/14 0500 04/10/14 0700  NA 142 141 143 139  K 4.1 3.6* 4.2 4.1  CL 107 109 110 106  CO2 22 20 20 22   GLUCOSE 141* 136* 124* 141*  BUN 22 18 15 21   CREATININE 0.84 0.83 0.82 1.00  CALCIUM 8.9 8.4 8.6 8.6   Liver Function Tests:  Recent Labs Lab 04/07/14 1110  AST 12  ALT <5  ALKPHOS 87  BILITOT 0.3  PROT 6.8  ALBUMIN 3.3*   No results found for this basename: LIPASE, AMYLASE,  in the last 168  hours No results found for this basename: AMMONIA,  in the last 168 hours CBC:  Recent Labs Lab 04/07/14 1110 04/08/14 0414 04/09/14 0500 04/10/14 0700  WBC 10.0 8.8 10.6* 9.3  NEUTROABS 8.2*  --   --   --   HGB 12.7 11.4* 11.0* 10.3*  HCT 36.6 33.2* 32.2* 29.9*  MCV 94.8 96.5 95.8 96.8  PLT 180 161 163 148*   Cardiac Enzymes: No results found for this basename: CKTOTAL, CKMB, CKMBINDEX, TROPONINI,  in the last 168 hours BNP (last 3 results) No results found for this basename: PROBNP,  in the last 8760 hours CBG:  Recent Labs Lab 04/09/14 0707 04/09/14 1103 04/09/14 1611 04/09/14 2140 04/10/14 0624  GLUCAP 106* 134* 122* 155* 150*    Recent Results (from the past 240 hour(s))  URINE CULTURE     Status: None   Collection Time    04/07/14 10:52 AM      Result Value Ref Range Status   Specimen Description URINE, CATHETERIZED   Final   Special Requests NONE   Final   Culture  Setup Time     Final   Value: 04/07/2014 14:54     Performed at [facility]   Colony Count     Final   Value: >=100,000 COLONIES/ML     [facility]   Culture  Final   Value: PSEUDOMONAS AERUGINOSA     Performed at [facility]   Report Status 04/09/2014 FINAL   Final   Organism ID, Bacteria PSEUDOMONAS AERUGINOSA   Final  SURGICAL PCR SCREEN     Status: None   Collection Time    04/08/14  3:44 AM      Result Value Ref Range Status   MRSA, PCR NEGATIVE  NEGATIVE Final   Staphylococcus aureus NEGATIVE  NEGATIVE Final   Comment:            The Xpert SA Assay (FDA     approved for NASAL specimens     in patients over 78 years of age),     is one component of     a comprehensive surveillance     program.  Test performance has     been validated by [facility] for patients greater     than or equal to 75 year old.     It is not intended     to diagnose infection nor to     guide or monitor treatment.     Studies: Dg Pelvis  Portable  04/08/2014   CLINICAL DATA:  Postop left hip hemi arthroplasty.  EXAM: PORTABLE PELVIS 1-2 VIEWS  COMPARISON:  None.  FINDINGS: Hemi arthroplasty on the left has a good appearance. Prosthetic components appear well positioned. No fracture. No other finding.  IMPRESSION: Good appearance following a left hemiarthroplasty.   Electronically Signed   By: Paulina Fusi M.D.   On: 04/08/2014 20:29    Scheduled Meds: . amLODipine  10 mg Oral Daily  . atenolol  100 mg Oral Daily  . atorvastatin  10 mg Oral q1800  . cefTRIAXone (ROCEPHIN)  IV  1 g Intravenous Q24H  . docusate sodium  100 mg Oral BID  . enoxaparin (LOVENOX) injection  30 mg Subcutaneous Q24H  . gemfibrozil  600 mg Oral BID AC  . insulin aspart  0-15 Units Subcutaneous TID WC  . insulin glargine  15 Units Subcutaneous QHS  . levothyroxine  125 mcg Oral QAC breakfast  . lisinopril  40 mg Oral Daily  . pantoprazole  40 mg Oral Daily   Continuous Infusions: . sodium chloride 20 mL/hr at 04/09/14 2000  . lactated ringers 50 mL/hr at 04/08/14 1508  . sodium chloride 0.9 % 1,000 mL infusion     Antibiotics Given (last 72 hours)   Date/Time Action Medication Dose Rate   04/07/14 1400 Given   cefTRIAXone (ROCEPHIN) 1 g in dextrose 5 % 50 mL IVPB 1 g 100 mL/hr   04/07/14 1624 Given  [First dose given by ED.]   cefTRIAXone (ROCEPHIN) 1 g in dextrose 5 % 50 mL IVPB 1 g 100 mL/hr   04/08/14 2332 Given   ceFAZolin (ANCEF) IVPB 2 g/50 mL premix 2 g 100 mL/hr   04/09/14 1846 Given  [med just available.]   cefTRIAXone (ROCEPHIN) 1 g in dextrose 5 % 50 mL IVPB 1 g 100 mL/hr      Principal Problem:   Left displaced femoral neck fracture Active Problems:   HTN (hypertension)   Hyperlipidemia   DM (diabetes mellitus)   Hypothyroidism   OA (osteoarthritis)   UTI (lower urinary tract infection)   Dehydration   Fracture of femoral neck, left    Time spent: 25    Joseph Art  [facility] Pager [phone]. If  7PM-7AM, please contact night-coverage at [email or web address], password  [email or web address] 04/10/2014, 9:06 AM  LOS: 3 days

## 2014-04-10 NOTE — H&P (Signed)
Physical Medicine and Rehabilitation Admission H&P    Chief Complaint  Patient presents with  . Fall  . Hip Pain  :  Chief complaint: Hip pain  HPI: April [name] is a 80 y.o. right-handed female with history of hypertension, diabetes mellitus with peripheral neuropathy. Patient lives with her daughter and was independent prior to admission. Presented 04/07/2014 after a fall on a wet floor while at work with [employer]. There was no loss of consciousness. X-rays and imaging revealed a displaced left femoral neck fracture. Underwent left hip hemiarthroplasty 04/08/2014 per Dr. Veverly Fells. Postoperative pain management. Partial weightbearing with posterior hip cautions. Placed on subcutaneous Lovenox for DVT prophylaxis. Acute blood loss anemia 10.3 and monitored. Urine culture greater than 100,000 Pseudomonas maintained on Rocephin and change to by mouth Cipro. Physical and occupational therapy evaluations completed with recommendations of physical medicine rehabilitation consult. Patient was admitted for comprehensive rehabilitation program      ROS Review of Systems  Gastrointestinal: Positive for constipation.  Musculoskeletal: Positive for joint pain and myalgias.  All other systems reviewed and are negative  Past Medical History  Diagnosis Date  . Hypertension   . Diabetes mellitus without complication   . HTN (hypertension) 04/07/2014  . Hyperlipidemia 04/07/2014  . Hypothyroidism 04/07/2014  . OA (osteoarthritis) 04/07/2014   Past Surgical History  Procedure Laterality Date  . Cholecystectomy     History reviewed. No pertinent family history. Social History:  reports that she has never smoked. She does not have any smokeless tobacco history on file. She reports that she does not drink alcohol or use illicit drugs. Allergies: No Known Allergies Medications Prior to Admission  Medication Sig Dispense Refill  . amLODipine (NORVASC) 10 MG tablet Take 10 mg  by mouth daily.      Marland Kitchen atenolol (TENORMIN) 100 MG tablet Take 100 mg by mouth daily.      Marland Kitchen gemfibrozil (LOPID) 600 MG tablet Take 600 mg by mouth 2 (two) times daily before a meal.      . ibuprofen (ADVIL,MOTRIN) 200 MG tablet Take 200-400 mg by mouth See admin instructions. Take 400 mg in the morning and 200 mg at bedtime.      . insulin glargine (LANTUS) 100 UNIT/ML injection Inject 35-45 Units into the skin at bedtime.      Marland Kitchen levothyroxine (SYNTHROID, LEVOTHROID) 125 MCG tablet Take 125 mcg by mouth daily before breakfast.      . lisinopril (PRINIVIL,ZESTRIL) 40 MG tablet Take 40 mg by mouth daily.      Marland Kitchen lovastatin (MEVACOR) 40 MG tablet Take 40 mg by mouth daily.        Home: Home Living Family/patient expects to be discharged to:: Skilled nursing facility Living Arrangements: Children Available Help at Discharge: Family;Available 24 hours/day Type of Home: House Home Access: Stairs to enter [employer] of Steps: 3 Entrance Stairs-Rails: Right Home Layout: One level Home Equipment: Walker - 2 wheels;Tub bench;Adaptive equipment Adaptive Equipment: Reacher;Sock aid;Long-handled shoe horn;Long-handled sponge Additional Comments: family member that works for [employer] provided, pt has not been educated in use   Functional History: Prior Function Comments: Pt is very active at baseline.  Functional Status:  Mobility:mod assist for basic mobility           ADL:    Cognition: Cognition Overall Cognitive Status: Within Functional Limits for tasks assessed Orientation Level: Oriented X4 Cognition Arousal/Alertness: Awake/alert Behavior During Therapy: Anxious Overall Cognitive Status: Within Functional Limits for tasks assessed  Physical Exam: Blood pressure 133/41, pulse 62, temperature 97.7 F (36.5 C), temperature source Oral, resp. rate 18, height _0  (1.727 m), weight 180 lb (81.647 kg), SpO2 98.00%. Physical Exam Constitutional: She is oriented to  person, place, and time. She appears well-developed.  HENT: oral mucosa pink and moist. Dentition fair Head: Normocephalic.  Eyes: EOM are normal.  Neck: Normal range of motion. Neck supple. No thyromegaly present.  Cardiovascular: Normal rate and regular rhythm. No murmurs, rubs, or gallops Respiratory: Effort normal and breath sounds normal. No respiratory distress. No wheezes, rales, or rhonchi GI: Soft. Bowel sounds are normal. She exhibits no distension. Non-tender  Skin:  Hip incision clean and dry with staples Psychiatric: She has a normal mood and affect. Very pleasant Neuro: A and O x 3.  motor strength bilateral upper extremities 5/5 in the  , bicep, tricep, grip, shoulders limited by RTC/OA and are approximately 3+.   Right lower extremity 4/5 in the hip flexor knee extensor ankle dorsiflexor plantar flexor  1/5 left hip flexor, 2- KE, 4/5 left ankle dorsiflexor plantar flexor.    Sensory intact to light touch in the upper and lower limbs. Cognitively intact. CN exam normal.   Results for orders placed during the hospital encounter of 04/07/14 (from the past 48 hour(s))  GLUCOSE, CAPILLARY     Status: Abnormal   Collection Time    04/08/14 11:40 AM      Result Value Ref Range   Glucose-Capillary 117 (*) 70 - 99 mg/dL  GLUCOSE, CAPILLARY     Status: Abnormal   Collection Time    04/08/14  2:51 PM      Result Value Ref Range   Glucose-Capillary 102 (*) 70 - 99 mg/dL  GLUCOSE, CAPILLARY     Status: Abnormal   Collection Time    04/08/14  7:27 PM      Result Value Ref Range   Glucose-Capillary 119 (*) 70 - 99 mg/dL  GLUCOSE, CAPILLARY     Status: Abnormal   Collection Time    04/08/14 10:27 PM      Result Value Ref Range   Glucose-Capillary 136 (*) 70 - 99 mg/dL  CBC     Status: Abnormal   Collection Time    04/09/14  5:00 AM      Result Value Ref Range   WBC 10.6 (*) 4.0 - 10.5 K/uL   RBC 3.36 (*) 3.87 - 5.11 MIL/uL   Hemoglobin 11.0 (*) 12.0 - 15.0 g/dL   HCT  32.2 (*) 36.0 - 46.0 %   MCV 95.8  78.0 - 100.0 fL   MCH 32.7  26.0 - 34.0 pg   MCHC 34.2  30.0 - 36.0 g/dL   RDW 14.1  11.5 - 15.5 %   Platelets 163  150 - 400 K/uL  BASIC METABOLIC PANEL     Status: Abnormal   Collection Time    04/09/14  5:00 AM      Result Value Ref Range   Sodium 143  137 - 147 mEq/L   Potassium 4.2  3.7 - 5.3 mEq/L   Chloride 110  96 - 112 mEq/L   CO2 20  19 - 32 mEq/L   Glucose, Bld 124 (*) 70 - 99 mg/dL   BUN 15  6 - 23 mg/dL   Creatinine, Ser 0.82  0.50 - 1.10 mg/dL   Calcium 8.6  8.4 - 10.5 mg/dL   GFR calc non Af Amer 63 (*) >90 mL/min  GFR calc Af Amer 73 (*) >90 mL/min   Comment: (NOTE)     The eGFR has been calculated using the CKD EPI equation.     This calculation has not been validated in all clinical situations.     eGFR's persistently <90 mL/min signify possible Chronic Kidney     Disease.  GLUCOSE, CAPILLARY     Status: Abnormal   Collection Time    04/09/14  7:07 AM      Result Value Ref Range   Glucose-Capillary 106 (*) 70 - 99 mg/dL  GLUCOSE, CAPILLARY     Status: Abnormal   Collection Time    04/09/14 11:03 AM      Result Value Ref Range   Glucose-Capillary 134 (*) 70 - 99 mg/dL   Comment 1 Notify RN     Comment 2 Documented in Chart    GLUCOSE, CAPILLARY     Status: Abnormal   Collection Time    04/09/14  4:11 PM      Result Value Ref Range   Glucose-Capillary 122 (*) 70 - 99 mg/dL   Comment 1 Notify RN     Comment 2 Documented in Chart    GLUCOSE, CAPILLARY     Status: Abnormal   Collection Time    04/09/14  9:40 PM      Result Value Ref Range   Glucose-Capillary 155 (*) 70 - 99 mg/dL  GLUCOSE, CAPILLARY     Status: Abnormal   Collection Time    04/10/14  6:24 AM      Result Value Ref Range   Glucose-Capillary 150 (*) 70 - 99 mg/dL  CBC     Status: Abnormal   Collection Time    04/10/14  7:00 AM      Result Value Ref Range   WBC 9.3  4.0 - 10.5 K/uL   RBC 3.09 (*) 3.87 - 5.11 MIL/uL   Hemoglobin 10.3 (*) 12.0 -  15.0 g/dL   HCT 29.9 (*) 36.0 - 46.0 %   MCV 96.8  78.0 - 100.0 fL   MCH 33.3  26.0 - 34.0 pg   MCHC 34.4  30.0 - 36.0 g/dL   RDW 14.5  11.5 - 15.5 %   Platelets 148 (*) 150 - 400 K/uL   Dg Pelvis Portable  04/08/2014   CLINICAL DATA:  Postop left hip hemi arthroplasty.  EXAM: PORTABLE PELVIS 1-2 VIEWS  COMPARISON:  None.  FINDINGS: Hemi arthroplasty on the left has a good appearance. Prosthetic components appear well positioned. No fracture. No other finding.  IMPRESSION: Good appearance following a left hemiarthroplasty.   Electronically Signed   By: Nelson Chimes M.D.   On: 04/08/2014 20:29    Post Admission Physician Evaluation: 1. Functional deficits secondary  to left femoral neck fracture after a fall. 2. Patient is admitted to receive collaborative, interdisciplinary care between the physiatrist, rehab nursing staff, and therapy team. 3. Patient's level of medical complexity and substantial therapy needs in context of that medical necessity cannot be provided at a lesser intensity of care such as a SNF. 4. Patient has experienced substantial functional loss from his/her baseline which was documented above under the "Functional History" and "Functional Status" headings.  Judging by the patient's diagnosis, physical exam, and functional history, the patient has potential for functional progress which will result in measurable gains while on inpatient rehab.  These gains will be of substantial and practical use upon discharge  in facilitating mobility and self-care at the household  level. 5. Physiatrist will provide 24 hour management of medical needs as well as oversight of the therapy plan/treatment and provide guidance as appropriate regarding the interaction of the two. 6. 24 hour rehab nursing will assist with bladder management, bowel management, safety, skin/wound care, disease management, medication administration, pain management and patient education  and help integrate therapy  concepts, techniques,education, etc. 7. PT will assess and treat for/with: Lower extremity strength, range of motion, stamina, balance, functional mobility, safety, adaptive techniques and equipment, pain mgt, hip precautions, education.   Goals are: mod I to supervision. 8. OT will assess and treat for/with: ADL's, functional mobility, safety, upper extremity strength, adaptive techniques and equipment, pain mgt, hip precautions, education.   Goals are: mod I to supervision. 9. SLP will assess and treat for/with: n/a.  Goals are: n/a. 10. Case Management and Social Worker will assess and treat for psychological issues and discharge planning. 11. Team conference will be held weekly to assess progress toward goals and to determine barriers to discharge. 12. Patient will receive at least 3 hours of therapy per day at least 5 days per week. 13. ELOS: 11-15 days       14. Prognosis:  excellent   Medical Problem List and Plan: 1. Left femoral neck fracture after a fall. Status post left hip hemiarthroplasty 04/08/2014. Partial weightbearing with posterior hip precautions 2. DVT Prophylaxis/Anticoagulation: Subcutaneous Lovenox. Monitor platelet counts any signs of bleeding. Check vascular study on admit 3. Pain Management: Oxycodone and Robaxin as needed. Monitor with increased mobility 4. Neuropsych: This patient is  capable of making decisions on his own behalf. 5. Acute blood loss anemia. Followup CBC. 6. Pseudomonas urinary tract infection. Maintained on Cipro 7. Diabetes mellitus with peripheral neuropathy. Hemoglobin A1c 6.8. Lantus insulin 15 units each bedtime. Check blood sugars a.c. and at bedtime 8. Hypertension. Norvasc 10 mg daily, Tenormin 100 mg daily, lisinopril 40 mg daily. Monitor with increased mobility 9. Hypothyroidism. Synthroid. Latest TSH level 4.400 10. Hyperlipidemia. Lipitor/ Lopid  Meredith Staggers, MD, [facility]  04/10/2014

## 2014-04-10 NOTE — Progress Notes (Addendum)
I met with April [name] and her daughters at bedside. All are in agreement to admission to inpt rehab today. I will contact Dr. Eliseo Squires to arrange. I have discussed with RN CM. I am also discussing with Workers Compensation. [phone] I await workers Economist to admit. Otherwise April [name] BCBS is primary insurance which I will also seek their approval for inpt rehab admission if workers compensation denies claim. Admission pending today vs Monday.

## 2014-04-10 NOTE — Progress Notes (Signed)
Orthopedics Progress Note  Subjective: I am ready for rehab  Objective:  Filed Vitals:   04/10/14 0500  BP: 133/41  Pulse: 62  Temp: 97.7 F (36.5 C)  Resp: 18    General: Awake and alert  Musculoskeletal: Left hip wound CDI, dressing spotted, will change today  Min swelling Neurovascularly intact  Lab Results  Component Value Date   WBC 9.3 04/10/2014   HGB 10.3* 04/10/2014   HCT 29.9* 04/10/2014   MCV 96.8 04/10/2014   PLT 148* 04/10/2014       Component Value Date/Time   NA 139 04/10/2014 0700   K 4.1 04/10/2014 0700   CL 106 04/10/2014 0700   CO2 22 04/10/2014 0700   GLUCOSE 141* 04/10/2014 0700   BUN 21 04/10/2014 0700   CREATININE 1.00 04/10/2014 0700   CALCIUM 8.6 04/10/2014 0700   GFRNONAA 50* 04/10/2014 0700   GFRAA 57* 04/10/2014 0700    Lab Results  Component Value Date   INR 1.06 04/07/2014    Assessment/Plan: POD #3 s/p Procedure(s): LEFT HIP HEMIARTHROPLASTY Stable from ortho standpoint Ok for transfer to rehab when bed available  Commercial Metals CompanySteven R. Ranell PatrickNorris, MD 04/10/2014 1:09 PM

## 2014-04-10 NOTE — Care Management Note (Signed)
CARE MANAGEMENT NOTE 04/10/2014  Patient:  April [name],April [name]   Account Number:  [id number]  Date Initiated:  04/08/2014  Documentation initiated by:  April [name],April [name]  Subjective/Objective Assessment:   80 yr old female admitted with left femoral neck fracture, OR today for left hip hemiarthroplasty.     Action/Plan:   PT/OT to eval.   Anticipated DC Date:  04/10/2014   Anticipated DC Plan:  IP REHAB FACILITY      DC Planning Services  CM consult      PAC Choice  IP REHAB   Choice offered to / List presented to:  C-1 Patient           Status of service:  Completed, signed off Medicare Important Message given?   (If response is "NO", the following Medicare IM given date fields will be blank) Date Medicare IM given:   Date Additional Medicare IM given:    Discharge Disposition:  IP REHAB FACILITY  Per UR Regulation:    If discussed at Long Length of Stay Meetings, dates discussed:    Comments:  04/10/14 2:15pm April PeperSusan April Duncombe, RN BSN Case Manager [phone] Patient has been approved for admission to CIR through Worker's comp.

## 2014-04-10 NOTE — H&P (View-Only) (Signed)
Physical Medicine and Rehabilitation Admission H&P    Chief Complaint  Patient presents with  . Fall  . Hip Pain  :  Chief complaint: Hip pain  HPI: April [name] is a 80 y.o. right-handed female with history of hypertension, diabetes mellitus with peripheral neuropathy. Patient lives with her daughter and was independent prior to admission. Presented 04/07/2014 after a fall on a wet floor while at work with [employer]. There was no loss of consciousness. X-rays and imaging revealed a displaced left femoral neck fracture. Underwent left hip hemiarthroplasty 04/08/2014 per Dr. Veverly Fells. Postoperative pain management. Partial weightbearing with posterior hip cautions. Placed on subcutaneous Lovenox for DVT prophylaxis. Acute blood loss anemia 10.3 and monitored. Urine culture greater than 100,000 Pseudomonas maintained on Rocephin and change to by mouth Cipro. Physical and occupational therapy evaluations completed with recommendations of physical medicine rehabilitation consult. Patient was admitted for comprehensive rehabilitation program      ROS Review of Systems  Gastrointestinal: Positive for constipation.  Musculoskeletal: Positive for joint pain and myalgias.  All other systems reviewed and are negative  Past Medical History  Diagnosis Date  . Hypertension   . Diabetes mellitus without complication   . HTN (hypertension) 04/07/2014  . Hyperlipidemia 04/07/2014  . Hypothyroidism 04/07/2014  . OA (osteoarthritis) 04/07/2014   Past Surgical History  Procedure Laterality Date  . Cholecystectomy     History reviewed. No pertinent family history. Social History:  reports that she has never smoked. She does not have any smokeless tobacco history on file. She reports that she does not drink alcohol or use illicit drugs. Allergies: No Known Allergies Medications Prior to Admission  Medication Sig Dispense Refill  . amLODipine (NORVASC) 10 MG tablet Take 10 mg  by mouth daily.      Marland Kitchen atenolol (TENORMIN) 100 MG tablet Take 100 mg by mouth daily.      Marland Kitchen gemfibrozil (LOPID) 600 MG tablet Take 600 mg by mouth 2 (two) times daily before a meal.      . ibuprofen (ADVIL,MOTRIN) 200 MG tablet Take 200-400 mg by mouth See admin instructions. Take 400 mg in the morning and 200 mg at bedtime.      . insulin glargine (LANTUS) 100 UNIT/ML injection Inject 35-45 Units into the skin at bedtime.      Marland Kitchen levothyroxine (SYNTHROID, LEVOTHROID) 125 MCG tablet Take 125 mcg by mouth daily before breakfast.      . lisinopril (PRINIVIL,ZESTRIL) 40 MG tablet Take 40 mg by mouth daily.      Marland Kitchen lovastatin (MEVACOR) 40 MG tablet Take 40 mg by mouth daily.        Home: Home Living Family/patient expects to be discharged to:: Skilled nursing facility Living Arrangements: Children Available Help at Discharge: Family;Available 24 hours/day Type of Home: House Home Access: Stairs to enter [employer] of Steps: 3 Entrance Stairs-Rails: Right Home Layout: One level Home Equipment: Walker - 2 wheels;Tub bench;Adaptive equipment Adaptive Equipment: Reacher;Sock aid;Long-handled shoe horn;Long-handled sponge Additional Comments: family member that works for [employer] provided, pt has not been educated in use   Functional History: Prior Function Comments: Pt is very active at baseline.  Functional Status:  Mobility:mod assist for basic mobility           ADL:    Cognition: Cognition Overall Cognitive Status: Within Functional Limits for tasks assessed Orientation Level: Oriented X4 Cognition Arousal/Alertness: Awake/alert Behavior During Therapy: Anxious Overall Cognitive Status: Within Functional Limits for tasks assessed  Physical Exam: Blood pressure 133/41, pulse 62, temperature 97.7 F (36.5 C), temperature source Oral, resp. rate 18, height _0  (1.727 m), weight 180 lb (81.647 kg), SpO2 98.00%. Physical Exam Constitutional: She is oriented to  person, place, and time. She appears well-developed.  HENT: oral mucosa pink and moist. Dentition fair Head: Normocephalic.  Eyes: EOM are normal.  Neck: Normal range of motion. Neck supple. No thyromegaly present.  Cardiovascular: Normal rate and regular rhythm. No murmurs, rubs, or gallops Respiratory: Effort normal and breath sounds normal. No respiratory distress. No wheezes, rales, or rhonchi GI: Soft. Bowel sounds are normal. She exhibits no distension. Non-tender  Skin:  Hip incision clean and dry with staples Psychiatric: She has a normal mood and affect. Very pleasant Neuro: A and O x 3.  motor strength bilateral upper extremities 5/5 in the  , bicep, tricep, grip, shoulders limited by RTC/OA and are approximately 3+.   Right lower extremity 4/5 in the hip flexor knee extensor ankle dorsiflexor plantar flexor  1/5 left hip flexor, 2- KE, 4/5 left ankle dorsiflexor plantar flexor.    Sensory intact to light touch in the upper and lower limbs. Cognitively intact. CN exam normal.   Results for orders placed during the hospital encounter of 04/07/14 (from the past 48 hour(s))  GLUCOSE, CAPILLARY     Status: Abnormal   Collection Time    04/08/14 11:40 AM      Result Value Ref Range   Glucose-Capillary 117 (*) 70 - 99 mg/dL  GLUCOSE, CAPILLARY     Status: Abnormal   Collection Time    04/08/14  2:51 PM      Result Value Ref Range   Glucose-Capillary 102 (*) 70 - 99 mg/dL  GLUCOSE, CAPILLARY     Status: Abnormal   Collection Time    04/08/14  7:27 PM      Result Value Ref Range   Glucose-Capillary 119 (*) 70 - 99 mg/dL  GLUCOSE, CAPILLARY     Status: Abnormal   Collection Time    04/08/14 10:27 PM      Result Value Ref Range   Glucose-Capillary 136 (*) 70 - 99 mg/dL  CBC     Status: Abnormal   Collection Time    04/09/14  5:00 AM      Result Value Ref Range   WBC 10.6 (*) 4.0 - 10.5 K/uL   RBC 3.36 (*) 3.87 - 5.11 MIL/uL   Hemoglobin 11.0 (*) 12.0 - 15.0 g/dL   HCT  32.2 (*) 36.0 - 46.0 %   MCV 95.8  78.0 - 100.0 fL   MCH 32.7  26.0 - 34.0 pg   MCHC 34.2  30.0 - 36.0 g/dL   RDW 14.1  11.5 - 15.5 %   Platelets 163  150 - 400 K/uL  BASIC METABOLIC PANEL     Status: Abnormal   Collection Time    04/09/14  5:00 AM      Result Value Ref Range   Sodium 143  137 - 147 mEq/L   Potassium 4.2  3.7 - 5.3 mEq/L   Chloride 110  96 - 112 mEq/L   CO2 20  19 - 32 mEq/L   Glucose, Bld 124 (*) 70 - 99 mg/dL   BUN 15  6 - 23 mg/dL   Creatinine, Ser 0.82  0.50 - 1.10 mg/dL   Calcium 8.6  8.4 - 10.5 mg/dL   GFR calc non Af Amer 63 (*) >90 mL/min  GFR calc Af Amer 73 (*) >90 mL/min   Comment: (NOTE)     The eGFR has been calculated using the CKD EPI equation.     This calculation has not been validated in all clinical situations.     eGFR's persistently <90 mL/min signify possible Chronic Kidney     Disease.  GLUCOSE, CAPILLARY     Status: Abnormal   Collection Time    04/09/14  7:07 AM      Result Value Ref Range   Glucose-Capillary 106 (*) 70 - 99 mg/dL  GLUCOSE, CAPILLARY     Status: Abnormal   Collection Time    04/09/14 11:03 AM      Result Value Ref Range   Glucose-Capillary 134 (*) 70 - 99 mg/dL   Comment 1 Notify RN     Comment 2 Documented in Chart    GLUCOSE, CAPILLARY     Status: Abnormal   Collection Time    04/09/14  4:11 PM      Result Value Ref Range   Glucose-Capillary 122 (*) 70 - 99 mg/dL   Comment 1 Notify RN     Comment 2 Documented in Chart    GLUCOSE, CAPILLARY     Status: Abnormal   Collection Time    04/09/14  9:40 PM      Result Value Ref Range   Glucose-Capillary 155 (*) 70 - 99 mg/dL  GLUCOSE, CAPILLARY     Status: Abnormal   Collection Time    04/10/14  6:24 AM      Result Value Ref Range   Glucose-Capillary 150 (*) 70 - 99 mg/dL  CBC     Status: Abnormal   Collection Time    04/10/14  7:00 AM      Result Value Ref Range   WBC 9.3  4.0 - 10.5 K/uL   RBC 3.09 (*) 3.87 - 5.11 MIL/uL   Hemoglobin 10.3 (*) 12.0 -  15.0 g/dL   HCT 29.9 (*) 36.0 - 46.0 %   MCV 96.8  78.0 - 100.0 fL   MCH 33.3  26.0 - 34.0 pg   MCHC 34.4  30.0 - 36.0 g/dL   RDW 14.5  11.5 - 15.5 %   Platelets 148 (*) 150 - 400 K/uL   Dg Pelvis Portable  04/08/2014   CLINICAL DATA:  Postop left hip hemi arthroplasty.  EXAM: PORTABLE PELVIS 1-2 VIEWS  COMPARISON:  None.  FINDINGS: Hemi arthroplasty on the left has a good appearance. Prosthetic components appear well positioned. No fracture. No other finding.  IMPRESSION: Good appearance following a left hemiarthroplasty.   Electronically Signed   By: Nelson Chimes M.D.   On: 04/08/2014 20:29    Post Admission Physician Evaluation: 1. Functional deficits secondary  to left femoral neck fracture after a fall. 2. Patient is admitted to receive collaborative, interdisciplinary care between the physiatrist, rehab nursing staff, and therapy team. 3. Patient's level of medical complexity and substantial therapy needs in context of that medical necessity cannot be provided at a lesser intensity of care such as a SNF. 4. Patient has experienced substantial functional loss from his/her baseline which was documented above under the "Functional History" and "Functional Status" headings.  Judging by the patient's diagnosis, physical exam, and functional history, the patient has potential for functional progress which will result in measurable gains while on inpatient rehab.  These gains will be of substantial and practical use upon discharge  in facilitating mobility and self-care at the household  level. 5. Physiatrist will provide 24 hour management of medical needs as well as oversight of the therapy plan/treatment and provide guidance as appropriate regarding the interaction of the two. 6. 24 hour rehab nursing will assist with bladder management, bowel management, safety, skin/wound care, disease management, medication administration, pain management and patient education  and help integrate therapy  concepts, techniques,education, etc. 7. PT will assess and treat for/with: Lower extremity strength, range of motion, stamina, balance, functional mobility, safety, adaptive techniques and equipment, pain mgt, hip precautions, education.   Goals are: mod I to supervision. 8. OT will assess and treat for/with: ADL's, functional mobility, safety, upper extremity strength, adaptive techniques and equipment, pain mgt, hip precautions, education.   Goals are: mod I to supervision. 9. SLP will assess and treat for/with: n/a.  Goals are: n/a. 10. Case Management and Social Worker will assess and treat for psychological issues and discharge planning. 11. Team conference will be held weekly to assess progress toward goals and to determine barriers to discharge. 12. Patient will receive at least 3 hours of therapy per day at least 5 days per week. 13. ELOS: 11-15 days       14. Prognosis:  excellent   Medical Problem List and Plan: 1. Left femoral neck fracture after a fall. Status post left hip hemiarthroplasty 04/08/2014. Partial weightbearing with posterior hip precautions 2. DVT Prophylaxis/Anticoagulation: Subcutaneous Lovenox. Monitor platelet counts any signs of bleeding. Check vascular study on admit 3. Pain Management: Oxycodone and Robaxin as needed. Monitor with increased mobility 4. Neuropsych: This patient is  capable of making decisions on his own behalf. 5. Acute blood loss anemia. Followup CBC. 6. Pseudomonas urinary tract infection. Maintained on Cipro 7. Diabetes mellitus with peripheral neuropathy. Hemoglobin A1c 6.8. Lantus insulin 15 units each bedtime. Check blood sugars a.c. and at bedtime 8. Hypertension. Norvasc 10 mg daily, Tenormin 100 mg daily, lisinopril 40 mg daily. Monitor with increased mobility 9. Hypothyroidism. Synthroid. Latest TSH level 4.400 10. Hyperlipidemia. Lipitor/ Lopid  Meredith Staggers, MD, [facility]  04/10/2014

## 2014-04-10 NOTE — Progress Notes (Signed)
Patient arrived from 5N via bed with RN and family at bedside. Explained rehab process, fall prevention plan, safety plan with verbal understanding. Patient oriented to call bell and bed alarm. Patient resting comfortable with bed alarm on, call bell at side and family at bedside.

## 2014-04-10 NOTE — Progress Notes (Signed)
Patient has been accepted to [facility].  Will transfer to 4W Bed 25 within the next hr.

## 2014-04-10 NOTE — Interval H&P Note (Signed)
Vanetta MuldersValerie L Nabi was admitted today to Inpatient Rehabilitation with the diagnosis of left hip fracture.  The patient's history has been reviewed, patient examined, and there is no change in status.  Patient continues to be appropriate for intensive inpatient rehabilitation.  I have reviewed the patient's chart and labs.  Questions were answered to the patient's satisfaction.  Ranelle OysterZachary T Swartz 04/10/2014, 5:54 PM

## 2014-04-10 NOTE — PMR Pre-admission (Signed)
PMR Admission Coordinator Pre-Admission Assessment  Patient: April [name] is an 80 y.o., female MRN: [id number] DOB: 03/01/1925 Height: 5\' 8"  (172.7 cm) Weight: 81.647 kg (180 lb)              Insurance Information HMO:     PPO:      PCP:      IPA:      80/20:      OTHER:  PRIMARY: Workers Compensation      Policy#: [id number]      Subscriber: pt CM Name: Cheryl SwazilandJordan      Phone#: [phone]     Fax#: [phone] Pre-Cert#: [id number]     Employer: [employer] follow up with Elnita Maxwellheryl as requested Update Thursday when Elnita Maxwellheryl comes onsite.  SECONDARY: [employer] of [place]      Policy#: [id number]      Subscriber: pt  Third:  Medicare a and B [id number] a effective a 11/24/92 b 06/25/03  Medicaid Application Date:       Case Manager:  Disability Application Date:       Case Worker:   Emergency Contact Information Contact Information   Name Relation Home Work Mobile   Smith,Sharon Daughter   [phone]   Danne BaxterMarshall,Jerry Son   [phone]   Epling,Judy Daughter [phone]  [phone]     Current Medical History  Patient Admitting Diagnosis: Left femoral neck fracture status post fall 04/07/2014  History of Present Illness:April [name] is a 80 y.o. right-handed female with history of hypertension, diabetes mellitus with peripheral neuropathy. Patient lives with her daughter and was independent prior to admission.   Presented 04/07/2014 after a fall on a wet floor while at work with [employer]. There was no loss of consciousness. X-rays and imaging revealed a displaced left femoral neck fracture. Underwent left hip hemiarthroplasty 04/08/2014 per Dr. Ranell PatrickNorris. Postoperative pain management. Partial weightbearing with posterior hip cautions. Placed on subcutaneous Lovenox for DVT prophylaxis.    Past Medical History  Past Medical History  Diagnosis Date  . Hypertension   . Diabetes mellitus without complication   . HTN (hypertension) 04/07/2014  . Hyperlipidemia  04/07/2014  . Hypothyroidism 04/07/2014  . OA (osteoarthritis) 04/07/2014    Family History  family history is not on file.  Prior Rehab/Hospitalizations: none  Current Medications  Current facility-administered medications:0.9 %  sodium chloride infusion, , Intravenous, Continuous, Verlee RossettiSteven R Norris, MD, Last Rate: 20 mL/hr at 04/09/14 2000;  acetaminophen (TYLENOL) suppository 650 mg, 650 mg, Rectal, Q6H PRN, Verlee RossettiSteven R Norris, MD;  acetaminophen (TYLENOL) tablet 650 mg, 650 mg, Oral, Q6H PRN, Verlee RossettiSteven R Norris, MD;  amLODipine (NORVASC) tablet 10 mg, 10 mg, Oral, Daily, Rodolph Bonganiel V Thompson, MD, 10 mg at 04/10/14 0955 atenolol (TENORMIN) tablet 100 mg, 100 mg, Oral, Daily, Rodolph Bonganiel V Thompson, MD, 100 mg at 04/10/14 [id number];  atorvastatin (LIPITOR) tablet 10 mg, 10 mg, Oral, q1800, Rodolph Bonganiel V Thompson, MD, 10 mg at 04/09/14 1649;  ciprofloxacin (CIPRO) tablet 500 mg, 500 mg, Oral, BID, Jessica U Vann, DO, 500 mg at 04/10/14 1109;  docusate sodium (COLACE) capsule 100 mg, 100 mg, Oral, BID, Rodolph Bonganiel V Thompson, MD, 100 mg at 04/10/14 0956 enoxaparin (LOVENOX) injection 30 mg, 30 mg, Subcutaneous, Q24H, Verlee RossettiSteven R Norris, MD, 30 mg at 04/10/14 [id number];  fenofibrate tablet 160 mg, 160 mg, Oral, Daily, Joseph ArtJessica U Vann, DO, 160 mg at 04/10/14 1220;  HYDROcodone-acetaminophen (NORCO/VICODIN) 5-325 MG per tablet 1-2 tablet, 1-2 tablet, Oral, Q6H PRN, Rodolph Bonganiel V Thompson, MD, 1 tablet at 04/10/14 1110  insulin aspart (novoLOG) injection 0-15 Units, 0-15 Units, Subcutaneous, TID WC, Rodolph Bonganiel V Thompson, MD, 3 Units at 04/10/14 1142;  insulin glargine (LANTUS) injection 20 Units, 20 Units, Subcutaneous, QHS, Jessica U Vann, DO;  levothyroxine (SYNTHROID, LEVOTHROID) tablet 125 mcg, 125 mcg, Oral, QAC breakfast, Rodolph Bonganiel V Thompson, MD, 125 mcg at 04/10/14 0735 lisinopril (PRINIVIL,ZESTRIL) tablet 40 mg, 40 mg, Oral, Daily, Rodolph Bonganiel V Thompson, MD, 40 mg at 04/10/14 [id number];  menthol-cetylpyridinium (CEPACOL) lozenge 3 mg, 1 lozenge, Oral, PRN,  Verlee RossettiSteven R Norris, MD;  metoCLOPramide (REGLAN) injection 5-10 mg, 5-10 mg, Intravenous, Q8H PRN, Verlee RossettiSteven R Norris, MD;  metoCLOPramide (REGLAN) tablet 5-10 mg, 5-10 mg, Oral, Q8H PRN, Verlee RossettiSteven R Norris, MD morphine 2 MG/ML injection 0.5 mg, 0.5 mg, Intravenous, Q2H PRN, Rodolph Bonganiel V Thompson, MD;  morphine 2 MG/ML injection 0.5 mg, 0.5 mg, Intravenous, Q2H PRN, Verlee RossettiSteven R Norris, MD;  ondansetron [facility](ZOFRAN) injection 4 mg, 4 mg, Intravenous, Q6H PRN, Verlee RossettiSteven R Norris, MD;  ondansetron [facility](ZOFRAN) tablet 4 mg, 4 mg, Oral, Q6H PRN, Verlee RossettiSteven R Norris, MD, 4 mg at 04/09/14 0206 oxyCODONE (Oxy IR/ROXICODONE) immediate release tablet 5-10 mg, 5-10 mg, Oral, Q4H PRN, Verlee RossettiSteven R Norris, MD, 10 mg at 04/09/14 0617;  pantoprazole (PROTONIX) EC tablet 40 mg, 40 mg, Oral, Daily, Rodolph Bonganiel V Thompson, MD, 40 mg at 04/10/14 0956;  phenol (CHLORASEPTIC) mouth spray 1 spray, 1 spray, Mouth/Throat, PRN, Verlee RossettiSteven R Norris, MD;  polyethylene glycol (MIRALAX / GLYCOLAX) packet 17 g, 17 g, Oral, Daily PRN, Rodolph Bonganiel V Thompson, MD sorbitol 70 % solution 30 mL, 30 mL, Oral, Daily PRN, Rodolph Bonganiel V Thompson, MD  Patients Current Diet: Carb Control  Precautions / Restrictions Precautions Precautions: Posterior Hip;Fall Precaution Booklet Issued: Yes (comment) Precaution Comments: Reviewed precautions Restrictions Weight Bearing Restrictions: Yes LLE Weight Bearing: Partial weight bearing LLE Partial Weight Bearing Percentage or Pounds: 50%   Prior Activity Level Community (5-7x/wk): pt works fulltime at [employer]; 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  Current Functional Level Cognition  Overall Cognitive Status: Within Functional Limits for tasks  assessed Orientation Level: Oriented X4    Extremity Assessment (includes Sensation/Coordination)          ADLs  Overall ADL's : Needs assistance/impaired Eating/Feeding: Independent;Sitting Grooming: Wash/dry hands;Wash/dry face;Brushing hair;Oral care;Set up;Sitting Upper Body Bathing: Sitting;Minimal assitance Lower Body Bathing: Total assistance;Sit to/from stand;Sitting/lateral leans Upper Body Dressing : Minimal assistance;Sitting Lower Body Dressing: Total assistance;Sitting/lateral leans;Sit to/from stand Toilet Transfer: +2 for physical assistance;Moderate assistance;Stand-pivot;BSC Toileting- Clothing Manipulation and Hygiene: Total assistance;Sit to/from stand Functional mobility during ADLs: +2 for physical assistance;Moderate assistance General ADL Comments: Began instruction in use of AE and DME.  Instructed in hip precautions related to LB ADL.    Mobility  Overal bed mobility: Needs Assistance Bed Mobility: Supine to Sit Supine to sit: Min assist;HOB elevated;+2 for physical assistance General bed mobility comments: Min assist with bed mobility for trunk control. HOB elevated, needs assist for scooting hips forward. Pt with posterior lean until at EOB. Verbal cues for technique and relies heavily on rail    Transfers  Overall transfer level: Needs assistance Equipment used: Rolling walker (2 wheeled) Transfers: Sit to/from Stand Sit to Stand: Mod assist;+2 physical assistance;From elevated surface Stand pivot transfers: +2 physical assistance;Mod assist General transfer  comment: Mod assist for sit<>stand with power up to lift. Pt needs verbal cues for hand placement and pulls on RW with opposite hand. Overall, improved ability from yesterday. Cues for upright posture when in standing position and to bring hips forward. Requires extra time and leans heavily over walker initially. Sit<>stand attempted from bed and bsc.    Ambulation / Gait / Stairs / Wheelchair  Mobility  Ambulation/Gait Ambulation/Gait assistance: Mod assist;+2 physical assistance Ambulation Distance (Feet): 6 Feet Assistive device: Rolling walker (2 wheeled) Gait Pattern/deviations: Step-to pattern;Decreased step length - right;Decreased step length - left;Decreased stance time - left;Decreased weight shift to left;Antalgic General Gait Details: Pt able to ambulate up to 6 feet today before needing a seated rest break. Improved ability to use UEs for support. Appears to maintain 50% WB status with knee immobilizer in place.    Posture / Balance Dynamic Sitting Balance Sitting balance - Comments: Sits EOB without assist, needs 1 UE for support    Special needs/care consideration Bowel mgmt: no BM since admission Bladder mgmt: urinary retention this am Diabetic mgmt  yes   Previous Home Environment Living Arrangements: Children  Lives With: Daughter (daughter lives with [place] in [place] Available Help at Discharge: Family;Available 24 hours/day Type of Home: House Home Layout: One level Home Access: Stairs to enter Entrance Stairs-Rails: Right Entrance Stairs-Number of Steps: 3 Bathroom Shower/Tub: Associate Professor: Yes How Accessible: Accessible via walker Home Care Services: No Additional Comments: family member that works for [employer] provided, pt has not been educated in use  Discharge Living Setting Plans for Discharge Living Setting: Patient's home;House Type of Home at Discharge: House Discharge Home Layout: One level Discharge Home Access: Stairs to enter Entrance Stairs-Rails: Right Entrance Stairs-Number of Steps: 3 Discharge Bathroom Shower/Tub: Tub/shower unit Discharge Bathroom Toilet: Standard Discharge Bathroom Accessibility: Yes How Accessible: Accessible via walker Does the patient have any problems obtaining your medications?: No  Social/Family/Support Systems Patient Roles: Parent;Other (Comment)  (fulltime employee for [employer]) Contact Information: Marvell Fuller, daughter Anticipated Caregiver: daughters Anticipated Caregiver's Contact Information: see above Ability/Limitations of Caregiver: none Caregiver Availability: 24/7 Discharge Plan Discussed with Primary Caregiver: Yes Is Caregiver In Agreement with Plan?: Yes Does Caregiver/Family have Issues with Lodging/Transportation while Pt is in Rehab?: No (daughter stays with pt while in hospital [facility] many nights)    Goals/Additional Needs Patient/Family Goal for Rehab: Mod I with PT and OT Expected length of stay: ELOS 10 to 12 days Pt/Family Agrees to Admission and willing to participate: Yes Program Orientation Provided & Reviewed with Pt/Caregiver Including Roles  & Responsibilities: Yes   Decrease burden of Care through IP rehab admission: n/a  Possible need for SNF placement upon discharge:no  Patient Condition: This patient's condition remains as documented in the consult dated 04/09/14, in which the Rehabilitation Physician determined and documented that the patient's condition is appropriate for intensive rehabilitative care in an inpatient rehabilitation facility. Will admit to inpatient rehab today.  Preadmission Screen Completed By:  Clois Dupes, 04/10/2014 2:04 PM ______________________________________________________________________   Discussed status with Dr. Riley Kill on 04/10/14 at  1350 and received telephone approval for admission today.  Admission Coordinator:  Clois Dupes, time 1350 Date 04/10/2014.

## 2014-04-10 NOTE — Discharge Summary (Signed)
Physician Discharge Summary  April MuldersValerie L Riso ONG:[id number] DOB: 12/10/1925 DOA: 04/07/2014  PCP: Juliette AlcideBURDINE,STEVEN E, MD  Admit date: 04/07/2014 Discharge date: 04/10/2014  Time spent: 35 minutes  Recommendations for Outpatient Follow-up:  1. Wean O2 2. Voiding trial- will d/c with foley 3. cipro for 7 days (pseudomonas UTI)  Discharge Diagnoses:  Principal Problem:   Left displaced femoral neck fracture Active Problems:   HTN (hypertension)   Hyperlipidemia   DM (diabetes mellitus)   Hypothyroidism   OA (osteoarthritis)   UTI (lower urinary tract infection)   Dehydration   Fracture of femoral neck, left   Discharge Condition: improved  Diet recommendation: carb  Filed Weights   04/07/14 0902  Weight: 81.647 kg (180 lb)    History of present illness:  April [name] is a 80 y.o. 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 Patient was unable to get up without the help 03 coworkers EMS was subsequently called and patient brought to the ED. Patient denies any syncope, no chest pain, no shortness of breath, no fever, no chills, no nausea, no vomiting, no abdominal pain, no constipation, no weakness, no cough. Patient does endorse some soft stool as well as some dysuria a week ago.  Patient was seen in the emergency room chest x-ray which was done was unremarkable. Plain films of the left hip showed a displaced left femoral neck fracture. Comprehensive metabolic profile was unremarkable. CBC was unremarkable. INR was 1.06. Urinalysis done was nitrite negative small leukocytes 21-50 WBCs many bacteria.  We were called to admit the patient for further evaluation and management. ED physician stated that Dr. Victorino DikeHewitt of orthopedics  was called and will be consulted on the patient for possible surgery tomorrow.   Hospital Course:  left displaced femoral neck fracture  Secondary to mechanical fall.  surgery s/p.  Orthopedic consultation  PT-- ? SNF   Acute resp failure-  -wean off O2  -check 2 view ok  hypertension  Stable. Continue home regimen of atenolol, lisinopril.   diabetes mellitus  lantus SSI   hypothyroidism  TSH ok  home dose Synthroid.   Hypokalemia  -replete   hyperlipidemia  Continue statin.   urinary tract infection- psuedomonas  Change to PO: cipro   dehydration  D/c fluid      Procedures: LEFT HIP HEMIARTHROPLASTY   Consultations:  Ortho  CIR  Discharge Exam: Filed Vitals:   04/10/14 0500  BP: 133/41  Pulse: 62  Temp: 97.7 F (36.5 C)  Resp: 18    General: A+Ox3, NAd Cardiovascular: rrr Respiratory: clear  Discharge Instructions You were cared for by a hospitalist during your hospital stay. If you have any questions about your discharge medications or the care you received while you were in the hospital after you are discharged, you can call the unit and asked to speak with the hospitalist on call if the hospitalist that took care of you is not available. Once you are discharged, your primary care physician will handle any further medical issues. Please note that NO REFILLS for any discharge medications will be authorized once you are discharged, as it is imperative that you return to your primary care physician (or establish a relationship with a primary care physician if you do  not have one) for your aftercare needs so that they can reassess your need for medications and monitor your lab values.  Discharge Orders   Future Orders Complete By Expires   Partial weight bearing  As directed    Questions:     % Body Weight:  50   Laterality:  left   Extremity:  Lower       Medication List    TAKE these medications       enoxaparin 40 MG/0.4ML injection   Commonly known as:  LOVENOX  Inject 0.4 mLs (40 mg total) into the skin daily. 30 days post op     HYDROcodone-acetaminophen 5-325 MG per tablet  Commonly known as:  NORCO  Take 1 tablet by mouth every 6 (six) hours as needed for moderate pain.      ASK your doctor about these medications       amLODipine 10 MG tablet  Commonly known as:  NORVASC  Take 10 mg by mouth daily.     atenolol 100 MG tablet  Commonly known as:  TENORMIN  Take 100 mg by mouth daily.     gemfibrozil 600 MG tablet  Commonly known as:  LOPID  Take 600 mg by mouth 2 (two) times daily before a meal.     ibuprofen 200 MG tablet  Commonly known as:  ADVIL,MOTRIN  Take 200-400 mg by mouth See admin instructions. Take 400 mg in the morning and 200 mg at bedtime.     insulin glargine 100 UNIT/ML injection  Commonly known as:  LANTUS  Inject 35-45 Units into the skin at bedtime.     levothyroxine 125 MCG tablet  Commonly known as:  SYNTHROID, LEVOTHROID  Take 125 mcg by mouth daily before breakfast.     lisinopril 40 MG tablet  Commonly known as:  PRINIVIL,ZESTRIL  Take 40 mg by mouth daily.     lovastatin 40 MG tablet  Commonly known as:  MEVACOR  Take 40 mg by mouth daily.       No Known Allergies     Follow-up Information   Follow up with NORRIS,STEVEN R, MD In 2 weeks. [phone])    Specialty:  Orthopedic Surgery   Contact information:   [address] [place] [address] [phone]        The results of significant diagnostics from this hospitalization (including imaging, microbiology, ancillary and laboratory) are listed below for reference.    Significant Diagnostic Studies: Dg Chest 2 View  04/10/2014   CLINICAL DATA:  Acute respiratory failure  EXAM: CHEST  2 VIEW  COMPARISON:  04/07/2014  FINDINGS: Cardiomediastinal silhouette is stable. Accessory azygos fissure/ lobe again noted. No infiltrate or pulmonary edema. Mild basilar atelectasis. Degenerative  changes thoracic spine.  IMPRESSION: No active cardiopulmonary disease.   Electronically Signed   By: Natasha MeadLiviu  Pop M.D.   On: 04/10/2014 10:51   Dg Hip Complete Left  04/07/2014   CLINICAL DATA:  Fall, left hip pain  EXAM: LEFT HIP - COMPLETE 2+ VIEW  COMPARISON:  None.  FINDINGS: There is a displaced subcapital fracture of the left femoral neck with cephalad migration of the distal fracture fragment. Hip is located.  IMPRESSION: Displaced femoral neck fracture.   Electronically Signed   By: Genevive BiStewart  Edmunds M.D.   On: 04/07/2014 10:19   Dg Pelvis Portable  04/08/2014   CLINICAL DATA:  Postop left hip hemi arthroplasty.  EXAM: PORTABLE PELVIS 1-2 VIEWS  COMPARISON:  None.  FINDINGS:  Hemi arthroplasty on the left has a good appearance. Prosthetic components appear well positioned. No fracture. No other finding.  IMPRESSION: Good appearance following a left hemiarthroplasty.   Electronically Signed   By: Paulina Fusi M.D.   On: 04/08/2014 20:29   Dg Chest Port 1 View  04/07/2014   CLINICAL DATA:  Preoperative evaluation; recent trauma ; hypertension  EXAM: PORTABLE CHEST - 1 VIEW  COMPARISON:  None.  FINDINGS: There is no edema or consolidation. There is minimal scarring in the left base. The heart is upper normal in size with normal pulmonary vascularity. No adenopathy. There is atherosclerotic change in the aorta. There is arthropathy with chronic rotator cuff tears in both shoulders. There is an azygos lobe on the right, an anatomic variant. No pneumothorax.  IMPRESSION: No edema or consolidation.  Minimal left base scarring.   Electronically Signed   By: Bretta Bang M.D.   On: 04/07/2014 11:42    Microbiology: Recent Results (from the past 240 hour(s))  URINE CULTURE     Status: None   Collection Time    04/07/14 10:52 AM      Result Value Ref Range Status   Specimen Description URINE, CATHETERIZED   Final   Special Requests NONE   Final   Culture  Setup Time     Final   Value: 04/07/2014  14:54     Performed at [facility] Count     Final   Value: >=100,000 COLONIES/ML     Performed at [facility]   Culture     Final   Value: PSEUDOMONAS AERUGINOSA     Performed at [facility]   Report Status 04/09/2014 FINAL   Final   Organism ID, Bacteria PSEUDOMONAS AERUGINOSA   Final  SURGICAL PCR SCREEN     Status: None   Collection Time    04/08/14  3:44 AM      Result Value Ref Range Status   MRSA, PCR NEGATIVE  NEGATIVE Final   Staphylococcus aureus NEGATIVE  NEGATIVE Final   Comment:            The Xpert SA Assay (FDA     approved for NASAL specimens     in patients over 49 years of age),     is one component of     a comprehensive surveillance     program.  Test performance has     been validated by [employer] for patients greater     than or equal to 71 year old.     It is not intended     to diagnose infection nor to     guide or monitor treatment.     Labs: Basic Metabolic Panel:  Recent Labs Lab 04/07/14 1110 04/08/14 0414 04/09/14 0500 04/10/14 0700  NA 142 141 143 139  K 4.1 3.6* 4.2 4.1  CL 107 109 110 106  CO2 22 20 20 22   GLUCOSE 141* 136* 124* 141*  BUN 22 18 15 21   CREATININE 0.84 0.83 0.82 1.00  CALCIUM 8.9 8.4 8.6 8.6   Liver Function Tests:  Recent Labs Lab 04/07/14 1110  AST 12  ALT <5  ALKPHOS 87  BILITOT 0.3  PROT 6.8  ALBUMIN 3.3*   No results found for this basename: LIPASE, AMYLASE,  in the last 168 hours No results found for this basename: AMMONIA,  in the last 168 hours CBC:  Recent Labs Lab  04/07/14 1110 04/08/14 0414 04/09/14 0500 04/10/14 0700  WBC 10.0 8.8 10.6* 9.3  NEUTROABS 8.2*  --   --   --   HGB 12.7 11.4* 11.0* 10.3*  HCT 36.6 33.2* 32.2* 29.9*  MCV 94.8 96.5 95.8 96.8  PLT 180 161 163 148*   Cardiac Enzymes: No results found for this basename: CKTOTAL, CKMB, CKMBINDEX, TROPONINI,  in the last 168 hours BNP: BNP (last 3 results) No results found for this  basename: PROBNP,  in the last 8760 hours CBG:  Recent Labs Lab 04/09/14 1103 04/09/14 1611 04/09/14 2140 04/10/14 0624 04/10/14 1108  GLUCAP 134* 122* 155* 150* 200*       Signed:  Joseph Art  [facility] 04/10/2014, 11:14 AM

## 2014-04-11 ENCOUNTER — Inpatient Hospital Stay (HOSPITAL_COMMUNITY): Payer: Worker's Compensation | Admitting: Physical Therapy

## 2014-04-11 ENCOUNTER — Inpatient Hospital Stay (HOSPITAL_COMMUNITY): Payer: Worker's Compensation | Admitting: Occupational Therapy

## 2014-04-11 DIAGNOSIS — M7989 Other specified soft tissue disorders: Secondary | ICD-10-CM

## 2014-04-11 LAB — GLUCOSE, CAPILLARY
GLUCOSE-CAPILLARY: 175 mg/dL — AB (ref 70–99)
GLUCOSE-CAPILLARY: 152 mg/dL — AB (ref 70–99)
Glucose-Capillary: 109 mg/dL — ABNORMAL HIGH (ref 70–99)
Glucose-Capillary: 138 mg/dL — ABNORMAL HIGH (ref 70–99)
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
## 2014-04-11 NOTE — Progress Notes (Addendum)
Physical Therapy Session Note  Patient Details  Name: April [name] MRN: [id number] Date of Birth: 07/05/1925  Today's Date: 04/11/2014 Time: 1330-1410 Time Calculation (min): 40 min  Skilled Therapeutic Interventions/Progress Updates:  Session focused on bed mobility, functional transfers, and maintaining posterior hip precautions. Pt able to recall 3/3 precautions and PWB precautions. Pt transferred supine <> sit to R with HOB elevated and use of R rail with mod-max A and max verbal cues for sequencing, technique, and maintaining hip precautions. Pt performed multiple sit<>stand transfers from varying bed heights using RW with mod A and verbal cues for safe hand placement. Pt able to ambulate forward/retro using RW 4 ft x 2 with seated rest between and modA. Verbal/tactile cues required for upright trunk posture, increased WB through LLE, with improvement noted throughout session. Answered pt's and daughter's questions regarding rehab stay and possible need for temporary ramp secondary to 2 standard steps and one 9-10 inch step to enter pt's home. Pt left semi reclined in bed, all needs within reach and daughter present.   Therapy Documentation Precautions:  Precautions Precautions: Fall;Posterior Hip Precaution Booklet Issued: Yes (comment) Precaution Comments: Reviewed precautions Required Braces or Orthoses: Knee Immobilizer - Left Restrictions Weight Bearing Restrictions: Yes LLE Weight Bearing: Partial weight bearing LLE Partial Weight Bearing Percentage or Pounds: 50% Pain: Pain Assessment Pain Score: 7  Pain Type: Acute pain Pain Location: Hip Pain Orientation: Left Pain Intervention(s): rest, repositioned  See FIM for current functional status  Therapy/Group: Individual Therapy  Kerney ElbeRebecca A Varner 04/11/2014, 5:54 PM

## 2014-04-11 NOTE — Progress Notes (Signed)
Occupational Therapy Session Note  Patient Details  Name: April [name] MRN: [id number] Date of Birth: 05/14/1925  Today's Date: 04/11/2014 Time: [id number] Time Calculation (min): 50 min  Short Term Goals: Week 1:  OT Short Term Goal 1 (Week 1): Patient will recall 3/3 posterior hip precautions during ADL session. OT Short Term Goal 2 (Week 1): Patient will complete bathing with minimum assistance with use of adaptive equipment. OT Short Term Goal 3 (Week 1): Patient will complete upper body dressing with modified independence. OT Short Term Goal 4 (Week 1): Patient will complete toileting with moderate assistance. OT Short Term Goal 5 (Week 1): Patient will complete lower body dressing with minimum assistance with use of adaptive equipment.  Skilled Therapeutic Interventions/Progress Updates:   Patient seen this afternoon with daughter present for occupational therapy session.  Emphasis on functional mobility, transfer training, recall/adherence to precautions, BUE strength, activity tolerance, and BUE ROM.  Patient transfers supine to EOB with total assistance for BLE and trunk management.  Patient dependent for donning knee immobilizer.  Patient transfers EOB to wheelchair via RW stand step transfer with moderate assistance with max assist sit to stand.  Patient requires max verbal cues for technique for adherence to hip precautions when completing functional mobility.  Patient able to recall 2/3 hip precautions and PWB precautions this afternoon.  Patient seen for BUE ROM, strength, and activity tolerance tasks in therapy gym.  Patient ROM 3/5 for shoulders.  Grip strength: R 25, 30, 30 pounds; L 35, 35, 33 pounds.  Patient completes BUE therapeutic activity tolerance with wedge on tabletop to push weighted ball up/down for scapular protraction/retraction and shoulder flexion, 3 sets x 10 reps, followed by BUE bicep curls, 3 sets x 10 reps with weighted ball.  Patient participates well and  is motivated.  Rehab potential is good.  Therapy Documentation Precautions:  Precautions Precautions: Fall;Posterior Hip Precaution Booklet Issued: Yes (comment) Precaution Comments: Reviewed precautions Required Braces or Orthoses: Knee Immobilizer - Left Restrictions Weight Bearing Restrictions: Yes LLE Weight Bearing: Partial weight bearing LLE Partial Weight Bearing Percentage or Pounds: 50% Vital Signs: Therapy Vitals Temp: 98.4 F (36.9 C) Temp src: Oral Pulse Rate: 71 Resp: 18 BP: 118/67 mmHg Patient Position, if appropriate: Lying Oxygen Therapy SpO2: 93 % O2 Device: None (Room air) Pain: Pain Assessment Pain Score: 8  Pain Type: Acute pain Pain Location: Hip Pain Orientation: Left Pain Intervention(s): Medication (See eMAR)  See FIM for current functional status  Therapy/Group: Individual Therapy  [name] 04/11/2014, 3:58 PM

## 2014-04-11 NOTE — Progress Notes (Signed)
[place] PHYSICAL MEDICINE & REHABILITATION     PROGRESS NOTE    Subjective/Complaints: Had a very good night. Slept well. Pain controlled so far. No sob,cough, n/v, cp  Objective: Vital Signs: Blood pressure 134/72, pulse 74, temperature 99 F (37.2 C), temperature source Oral, resp. rate 18, height 5\' 8"  (1.727 m), weight 82.8 kg (182 lb 8.7 oz), SpO2 93.00%. Dg Chest 2 View  04/10/2014   CLINICAL DATA:  Acute respiratory failure  EXAM: CHEST  2 VIEW  COMPARISON:  04/07/2014  FINDINGS: Cardiomediastinal silhouette is stable. Accessory azygos fissure/ lobe again noted. No infiltrate or pulmonary edema. Mild basilar atelectasis. Degenerative changes thoracic spine.  IMPRESSION: No active cardiopulmonary disease.   Electronically Signed   By: Natasha MeadLiviu  Pop M.D.   On: 04/10/2014 10:51    Recent Labs  04/09/14 0500 04/10/14 0700  WBC 10.6* 9.3  HGB 11.0* 10.3*  HCT 32.2* 29.9*  PLT 163 148*    Recent Labs  04/09/14 0500 04/10/14 0700  NA 143 139  K 4.2 4.1  CL 110 106  GLUCOSE 124* 141*  BUN 15 21  CREATININE 0.82 1.00  CALCIUM 8.6 8.6   CBG (last 3)   Recent Labs  04/10/14 1602 04/10/14 2020 04/11/14 0726  GLUCAP 208* 192* 109*    Wt Readings from Last 3 Encounters:  04/10/14 82.8 kg (182 lb 8.7 oz)  04/07/14 81.647 kg (180 lb)  04/07/14 81.647 kg (180 lb)    Physical Exam:  Constitutional: She is oriented to person, place, and time. She appears well-developed.  HENT: oral mucosa pink and moist. Dentition fair  Head: Normocephalic.  Eyes: EOM are normal.  Neck: Normal range of motion. Neck supple. No thyromegaly present.  Cardiovascular: Normal rate and regular rhythm. No murmurs, rubs, or gallops  Respiratory: Effort normal and breath sounds normal. No respiratory distress. No wheezes, rales, or rhonchi  GI: Soft. Bowel sounds are normal. She exhibits no distension. Non-tender  Skin:  Hip incision clean and dry with staples--no change Psychiatric: She  has a normal mood and affect. Very pleasant  Neuro: A and O x 3.  motor strength bilateral upper extremities 5/5 in the , bicep, tricep, grip, shoulders limited by RTC/OA and are approximately 3+.  Right lower extremity 4/5 in the hip flexor knee extensor ankle dorsiflexor plantar flexor  1/5 left hip flexor, 2- KE, 4/5 left ankle dorsiflexor plantar flexor.  Sensory intact to light touch in the upper and lower limbs. Cognitively intact. CN exam normal   Assessment/Plan: 1. Functional deficits secondary to left femoral neck fracture after fall s/p left hip hemiarthroplasty which require 3+ hours per day of interdisciplinary therapy in a comprehensive inpatient rehab setting. Physiatrist is providing close team supervision and 24 hour management of active medical problems listed below. Physiatrist and rehab team continue to assess barriers to discharge/monitor patient progress toward functional and medical goals. FIM: FIM - Bathing Bathing Steps Patient Completed: Chest;Right Arm;Left Arm;Abdomen;Front perineal area;Right upper leg Bathing: 3: Mod-Patient completes 5-7 322f 10 parts or 50-74%  FIM - Upper Body Dressing/Undressing Upper body dressing/undressing steps patient completed: Thread/unthread right sleeve of pullover shirt/dresss;Thread/unthread left sleeve of pullover shirt/dress Upper body dressing/undressing: 3: Mod-Patient completed 50-74% of tasks FIM - Lower Body Dressing/Undressing Lower body dressing/undressing steps patient completed: Pull pants up/down Lower body dressing/undressing: 1: Total-Patient completed less than 25% of tasks  FIM - Toileting Toileting: 0: Activity did not occur  FIM - ArchivistToilet Transfers Toilet Transfers: 0-Activity did not occur  Comprehension Comprehension Mode: Auditory Comprehension: 6-Follows complex conversation/direction: With extra time/assistive device  Expression Expression Mode: Verbal Expression: 6-Expresses complex ideas:  With extra time/assistive device  Social Interaction Social Interaction: 7-Interacts appropriately with others - No medications needed.  Problem Solving Problem Solving: 6-Solves complex problems: With extra time  Memory Memory: 5-Recognizes or recalls 90% of the time/requires cueing < 10% of the time  Medical Problem List and Plan:  1. Left femoral neck fracture after a fall. Status post left hip hemiarthroplasty 04/08/2014. Partial weightbearing with posterior hip precautions  2. DVT Prophylaxis/Anticoagulation: Subcutaneous Lovenox. Monitor platelet counts any signs of bleeding. Check vascular study today 3. Pain Management: Oxycodone and Robaxin as needed. Monitor with increased mobility  4. Neuropsych: This patient is capable of making decisions on his own behalf.  5. Acute blood loss anemia. Followup CBC.  6. Pseudomonas urinary tract infection. continue Cipro  7. Diabetes mellitus with peripheral neuropathy. Hemoglobin A1c 6.8. Lantus insulin 15 units each bedtime. Check blood sugars a.c. and at bedtime   -some elevation yesterday---follow for pattern 8. Hypertension. Norvasc 10 mg daily, Tenormin 100 mg daily, lisinopril 40 mg daily. Monitor with increased mobility  9. Hypothyroidism. Synthroid. Latest TSH level 4.400  10. Hyperlipidemia. Lipitor/ Lopid  LOS (Days) 1 A FACE TO FACE EVALUATION WAS PERFORMED  Ranelle OysterZachary T Marita Burnsed 04/11/2014 8:53 AM

## 2014-04-11 NOTE — Progress Notes (Signed)
VASCULAR LAB PRELIMINARY  PRELIMINARY  PRELIMINARY  PRELIMINARY  Bilateral lower extremity venous Dopplers completed.    Preliminary report:  There is no DVT or SVT noted in the bilateral lower extremities.  Kern AlbertaCandace R Kellin Bartling, RVT 04/11/2014, 4:52 PM

## 2014-04-11 NOTE — Evaluation (Signed)
Physical Therapy Assessment and Plan  Patient Details  Name: April [name] MRN: [id number] Date of Birth: 05/15/1925  PT Diagnosis: Difficulty walking and Left hip pain, decreased strength, and decreased range of motion Rehab Potential:  Good ELOS:  10-12 days   Today's Date: 04/11/2014 Time: [phone] Time Calculation (min): 60 min  Problem List:  Patient Active Problem List   Diagnosis Date Noted  . Hip fracture 04/10/2014  . Fracture of femoral neck, left 04/08/2014  . Left displaced femoral neck fracture 04/07/2014  . HTN (hypertension) 04/07/2014  . Hyperlipidemia 04/07/2014  . DM (diabetes mellitus) 04/07/2014  . Hypothyroidism 04/07/2014  . OA (osteoarthritis) 04/07/2014  . UTI (lower urinary tract infection) 04/07/2014  . Dehydration 04/07/2014    Past Medical History:  Past Medical History  Diagnosis Date  . Hypertension   . Diabetes mellitus without complication   . HTN (hypertension) 04/07/2014  . Hyperlipidemia 04/07/2014  . Hypothyroidism 04/07/2014  . OA (osteoarthritis) 04/07/2014   Past Surgical History:  Past Surgical History  Procedure Laterality Date  . Cholecystectomy    . Hip arthroplasty Left 04/08/2014    Procedure: LEFT HIP HEMIARTHROPLASTY;  Surgeon: Augustin Schooling, MD;  Location: [facility];  Service: Orthopedics;  Laterality: Left;    Assessment & Plan Clinical Impression:   Patient currently requires mod- max assist with mobility secondary to muscle weakness and left hip pain.  Prior to hospitalization, patient was independent  with mobility and lived with Daughter in a House. Home access is 3 Stairs to enter.  Patient will benefit from skilled PT intervention to maximize safe functional mobility, minimize fall risk and decrease caregiver burden for planned discharge home with intermittent assist to 24/7 supervision.  Anticipate patient will benefit from follow up [place] at discharge.   Skilled Therapeutic Intervention Skilled therapeutic  intervention initiated after completion of evaluation. Pt received on 1 L02 via n.c., pt performed eval and treatment on room air with Sp02 >92% with vitals monitored throughout session. Focus on functional transfers and posture/safety with static standing. Pt performed multiple sit<>stands from w/c with verbal cues for safe hand placement on arm rests and RW and verbal/tactile cues for increased weightshift to L LE. Pt with difficulty maintaining midline in RW due to increased lean to right, requiring mod-max assist to maintain static standing balance in proper alignment. Able to recall PWB precautions and 2/3 posterior hip precautions; mod verbal cues with bed mobility to maintain THP. Pt left sitting in w/c with all needs within reach at end of session. Discussed falls risk, safety within room, and focus of therapy during stay. Discussed possible LOS, goals, and f/u therapy. Patient and daughter verbalized understanding.  PT Evaluation Precautions/Restrictions Precautions Precautions: Fall;Posterior Hip Precaution Comments: Reviewed precautions Restrictions Weight Bearing Restrictions: Yes LLE Weight Bearing: Partial weight bearing LLE Partial Weight Bearing Percentage or Pounds: 50% General   Vital SignsTherapy Vitals Pulse Rate: 78 BP: 122/57 mmHg Patient Position, if appropriate: Sitting Oxygen Therapy SpO2: 93 % O2 Device: None (Room air) Pain Pain Assessment Pain Score: 7/10 acute, surgical pain L hip Repositioned, Rest Home Living/Prior Functioning Home Living Available Help at Discharge: Family;Available 24 hours/day Type of Home: House Home Access: Stairs to enter [employer] of Steps: 3 - two standard steps and one 9-10 inch step Entrance Stairs-Rails: Right Home Layout: One level  Lives With: Daughter April [name]) Prior Function Level of Independence: Independent with basic ADLs;Independent with transfers;Independent with homemaking with ambulation;Independent  with gait  Able to Take Stairs?:  Yes Driving: Yes Vocation: Full time employment Vocation Requirements: Works at [employer] Leisure: Hobbies-yes (Comment) Comments: arts, music, going to church Vision/Perception   No changes from baseline  Cognition Overall Cognitive Status: Within Functional Limits for tasks assessed Arousal/Alertness: Awake/alert Orientation Level: Oriented X4 Safety/Judgment: Appears intact Comments: able to recall PWB precautions and able to recall 2/3 posterior hip precautions; requires verbal cues to maintain hip precautions Sensation Sensation Light Touch: Appears Intact Coordination Gross Motor Movements are Fluid and Coordinated: Yes Fine Motor Movements are Fluid and Coordinated: Yes Motor  Motor Motor: Within Functional Limits Motor - Skilled Clinical Observations: Limited by L hip pain  Mobility Bed Mobility Bed Mobility: Supine to Sit;Sit to Supine Supine to Sit: 2: Max assist;HOB elevated;With rails Supine to Sit Details: Verbal cues for sequencing;Verbal cues for technique;Verbal cues for precautions/safety;Tactile cues for initiation;Tactile cues for sequencing Supine to Sit Details (indicate cue type and reason): mod verbal/tactile cues for sequencing, technique, maintaining THP, depends heavily on rail with ineffective use of BUEs  Sit to Supine: 2: Max assist;HOB flat Sit to Supine - Details: Verbal cues for technique;Verbal cues for sequencing;Verbal cues for precautions/safety Sit to Supine - Details (indicate cue type and reason): max A for BLE and trunk mgmt, mod verbal cues Transfers Transfers: Yes Stand Pivot Transfers: 3: Mod assist;2: Max assist;From elevated surface;With armrests Stand Pivot Transfer Details: Tactile cues for sequencing;Tactile cues for weight shifting;Tactile cues for posture;Tactile cues for weight beaing;Verbal cues for technique;Verbal cues for sequencing;Verbal cues for precautions/safety;Verbal  cues for safe use of DME/AE Stand Pivot Transfer Details (indicate cue type and reason): mod-max verbal/tactile cues for safety, sequencing, and technique, decreased ability to WB through LLE, increased leaning to R instead of upright posture  Locomotion  Ambulation Ambulation: Yes Ambulation/Gait Assistance: 2: Max assist;3: Mod assist Assistive device: Rolling walker Ambulation/Gait Assistance Details: Verbal cues for sequencing;Verbal cues for technique;Verbal cues for precautions/safety;Verbal cues for gait pattern;Verbal cues for safe use of DME/AE;Manual facilitation for weight shifting Gait Gait: Yes Gait Pattern: Impaired Gait Pattern: Step-to pattern;Decreased step length - right;Decreased stance time - left;Decreased hip/knee flexion - right;Decreased hip/knee flexion - left;Decreased weight shift to left;Left flexed knee in stance;Shuffle;Narrow base of support;Trunk flexed;Lateral trunk lean to right Gait velocity: decreased High Level Ambulation High Level Ambulation: Backwards walking;Side stepping Side Stepping: for transfers bed<>w/c, max verbal cues for sequencing  Backwards Walking: for transfers bed<>w/c, max verbal cues for sequencing  Stairs / Additional Locomotion Stairs: No (secondary to L hip pain) Wheelchair Mobility Wheelchair Mobility: Yes Wheelchair Assistance: 4: Advertising account executive Details: Verbal cues for Marketing executive: Both upper extremities Wheelchair Parts Management: Needs assistance (to maintain posterior THP )  Trunk/Postural Assessment  Cervical Assessment Cervical Assessment: Within Functional Limits Thoracic Assessment Thoracic Assessment: Within Functional Limits Lumbar Assessment Lumbar Assessment: Within Functional Limits Postural Control Postural Control: Within Functional Limits  Balance Balance Balance Assessed: Yes Static Sitting Balance Static Sitting - Balance Support: Feet supported;Right upper  extremity supported Static Sitting - Level of Assistance: 7: Independent Static Sitting - Comment/# of Minutes: 10 Dynamic Sitting Balance Dynamic Sitting - Balance Support: Right upper extremity supported;Feet supported Dynamic Sitting - Level of Assistance: 7: Independent Dynamic Sitting - Balance Activities: Lateral lean/weight shifting;Forward lean/weight shifting Static Standing Balance Static Standing - Balance Support: Bilateral upper extremity supported;During functional activity Static Standing - Level of Assistance: 3: Mod assist;4: Min assist Static Standing - Comment/# of Minutes: impaired WB through LLE resulting in trunk lean to  R and difficulty maintaining upright posture in middle of RW, ineffective use of BUEs due to baseline weakness/previous injury Extremity Assessment  RUE Assessment RUE Assessment: Exceptions to [facility] (Refer to OT evaluation) RUE Strength RUE Overall Strength: Deficits;Due to premorbid status LUE Assessment LUE Assessment: Exceptions to [facility] (Refer to OT evaluation) LUE Strength LUE Overall Strength: Deficits;Due to premorbid status RLE Assessment RLE Assessment: Exceptions to [facility] RLE Strength RLE Overall Strength: Deficits;Due to premorbid status RLE Overall Strength Comments: hip flexion 3-/5, knee flex/ext 3+/5, ankle DF/PF 5/5 LLE Assessment LLE Assessment: Exceptions to [facility] LLE Strength LLE Overall Strength: Deficits;Due to pain;Due to precautions LLE Overall Strength Comments: knee flex/ext 3+/5, ankle DF/PF 5/5, unable to test hip flex/ext 2/2 posterior THP  FIM:  FIM - Control and instrumentation engineer Devices: Walker;Bed rails;Arm rests;HOB elevated Bed/Chair Transfer: 2: Bed > Chair or W/C: Max A (lift and lower assist);2: Sit > Supine: Max A (lifting assist/Pt. 25-49%);2: Supine > Sit: Max A (lifting assist/Pt. 25-49%) FIM - Locomotion: Wheelchair Distance: 30 Locomotion: Wheelchair: 1: Travels less than 50 ft with  minimal assistance (Pt.>75%) FIM - Locomotion: Ambulation Locomotion: Ambulation Assistive Devices: Administrator Ambulation/Gait Assistance: 2: Max assist Locomotion: Ambulation: 1: Travels less than 50 ft with maximal assistance (Pt: 25 - 49%) FIM - Locomotion: Stairs Locomotion: Stairs: 0: Activity did not occur (secondary to L hip pain)   Refer to Care Plan for Long Term Goals  Recommendations for other services: None  Discharge Criteria: Patient will be discharged from PT if patient refuses treatment 3 consecutive times without medical reason, if treatment goals not met, if there is a change in medical status, if patient makes no progress towards goals or if patient is discharged from hospital.  The above assessment, treatment plan, treatment alternatives and goals were discussed and mutually agreed upon: by patient and by family  Laretta Alstrom 04/11/2014, 5:19 PM

## 2014-04-12 LAB — GLUCOSE, CAPILLARY
GLUCOSE-CAPILLARY: 151 mg/dL — AB (ref 70–99)
GLUCOSE-CAPILLARY: 143 mg/dL — AB (ref 70–99)
GLUCOSE-CAPILLARY: 180 mg/dL — AB (ref 70–99)
Glucose-Capillary: 131 mg/dL — ABNORMAL HIGH (ref 70–99)

## 2014-04-12 NOTE — IPOC Note (Signed)
Overall Plan of Care [facility](IPOC) Patient Details Name: April [name] MRN: [id number] DOB: 01/18/1925  Admitting Diagnosis: L FN Fx  Hospital Problems: Active Problems:   Hip fracture     Functional Problem List: Nursing Behavior;Bladder;Bowel;Edema;Endurance;Medication Management;Motor;Nutrition;Pain;Perception;Safety;Sensory;Skin Integrity  PT Balance;Endurance;Pain;Safety;Motor  OT Endurance;Pain;Skin Integrity;Other (Comment) (strength; adherence to precautions; recall of precautions)  SLP    TR         Basic ADL's: OT Grooming;Bathing;Dressing;Toileting     Advanced  ADL's: OT Simple Meal Preparation     Transfers: PT Bed Mobility;Bed to Chair;Car;Furniture  OT Toilet;Tub/Shower     Locomotion: PT Ambulation;Stairs;Wheelchair Mobility     Additional Impairments: OT    SLP        TR      Anticipated Outcomes Item Anticipated Outcome  Self Feeding independent  Swallowing      Basic self-care  supervision  Toileting  supervision   Bathroom Transfers supervision  Bowel/Bladder  Continent of bowel and bladder with min A  Transfers     Locomotion     Communication     Cognition     Pain  <2 on a 0-10 scale  Safety/Judgment  Min A   Therapy Plan: PT Intensity: Minimum of 1-2 x/day ,45 to 90 minutes PT Frequency: 5 out of 7 days OT Intensity: Minimum of 1-2 x/day, 45 to 90 minutes OT Frequency: Total of 15 hours over 7 days OT Duration/Estimated Length of Stay: 10-12 days         Team Interventions: Nursing Interventions Patient/Family Education;Bladder Management;Bowel Management;Disease Management/Prevention;Pain Management;Medication Management;Skin Care/Wound Management;Cognitive Remediation/Compensation;Discharge Planning;Psychosocial Support  PT interventions Ambulation/gait training;Balance/vestibular training;DME/adaptive equipment instruction;Functional mobility training;Neuromuscular re-education;Pain management;Patient/family  education;Stair training;Therapeutic Activities;Therapeutic Exercise;UE/LE Strength taining/ROM  OT Interventions Balance/vestibular training;Community reintegration;Discharge planning;DME/adaptive equipment instruction;Functional mobility training;Pain management;Patient/family education;Psychosocial support;Self Care/advanced ADL retraining;Skin care/wound managment;Therapeutic Activities;Therapeutic Exercise;UE/LE Strength taining/ROM;UE/LE Coordination activities;Wheelchair propulsion/positioning  SLP Interventions    TR Interventions    SW/CM Interventions      Team Discharge Planning: Destination: PT-Home ,OT- Home , SLP-  Projected Follow-up: PT-Home health PT, OT-  Home health OT, SLP-  Projected Equipment Needs: PT-Rolling walker with 5" wheels, OT- 3 in 1 bedside comode;To be determined, SLP-  Equipment Details: PT- , OT-  Patient/family involved in discharge planning: PT- Patient;Family member/caregiver,  OT-Patient, SLP-   MD ELOS: 10 days Medical Rehab Prognosis:  Excellent Assessment: The patient has been admitted for CIR therapies. The team will be addressing functional mobility, strength, stamina, balance, safety, adaptive techniques and equipment, self-care, bowel and bladder mgt, patient and caregiver education, pain mgt, hip precautions, wound care. Goals have been set at supervision for self-care and mobility. Pt is extremely motivated.Ranelle Oyster.    Zachary T. Swartz, MD, FAAPMR      See Team Conference Notes for weekly updates to the plan of care

## 2014-04-12 NOTE — Progress Notes (Signed)
[place] PHYSICAL MEDICINE & REHABILITATION     PROGRESS NOTE    Subjective/Complaints: No problems last night. In good spirits. Slept well.    Objective: Vital Signs: Blood pressure 132/72, pulse 67, temperature 97.8 F (36.6 C), temperature source Oral, resp. rate 18, height 5\' 8"  (1.727 m), weight 82.8 kg (182 lb 8.7 oz), SpO2 96.00%. Dg Chest 2 View  04/10/2014   CLINICAL DATA:  Acute respiratory failure  EXAM: CHEST  2 VIEW  COMPARISON:  04/07/2014  FINDINGS: Cardiomediastinal silhouette is stable. Accessory azygos fissure/ lobe again noted. No infiltrate or pulmonary edema. Mild basilar atelectasis. Degenerative changes thoracic spine.  IMPRESSION: No active cardiopulmonary disease.   Electronically Signed   By: Natasha MeadLiviu  Pop M.D.   On: 04/10/2014 10:51    Recent Labs  04/10/14 0700  WBC 9.3  HGB 10.3*  HCT 29.9*  PLT 148*    Recent Labs  04/10/14 0700  NA 139  K 4.1  CL 106  GLUCOSE 141*  BUN 21  CREATININE 1.00  CALCIUM 8.6   CBG (last 3)   Recent Labs  04/11/14 1620 04/11/14 2115 04/12/14 0749  GLUCAP 138* 152* 131*    Wt Readings from Last 3 Encounters:  04/10/14 82.8 kg (182 lb 8.7 oz)  04/07/14 81.647 kg (180 lb)  04/07/14 81.647 kg (180 lb)    Physical Exam:  Constitutional: She is oriented to person, place, and time. She appears well-developed.  HENT: oral mucosa pink and moist. Dentition fair  Head: Normocephalic.  Eyes: EOM are normal.  Neck: Normal range of motion. Neck supple. No thyromegaly present.  Cardiovascular: Normal rate and regular rhythm. No murmurs, rubs, or gallops  Respiratory: Effort normal and breath sounds normal. No respiratory distress. No wheezes, rales, or rhonchi  GI: Soft. Bowel sounds are normal. She exhibits no distension. Non-tender  Skin:  Hip incision clean and dry with staples--no change Psychiatric: She has a normal mood and affect. Very pleasant  Neuro: A and O x 3.  motor strength bilateral upper  extremities 5/5 in the , bicep, tricep, grip, shoulders limited by RTC/OA and are approximately 3+.  Right lower extremity 4/5 in the hip flexor knee extensor ankle dorsiflexor plantar flexor  1/5 left hip flexor, 2- KE, 4/5 left ankle dorsiflexor plantar flexor.  Sensory intact to light touch in the upper and lower limbs. Cognitively intact. CN exam normal   Assessment/Plan: 1. Functional deficits secondary to left femoral neck fracture after fall s/p left hip hemiarthroplasty which require 3+ hours per day of interdisciplinary therapy in a comprehensive inpatient rehab setting. Physiatrist is providing close team supervision and 24 hour management of active medical problems listed below. Physiatrist and rehab team continue to assess barriers to discharge/monitor patient progress toward functional and medical goals. FIM: FIM - Bathing Bathing Steps Patient Completed: Chest;Right Arm;Left Arm;Abdomen;Front perineal area;Right upper leg Bathing: 3: Mod-Patient completes 5-7 [id number] 10 parts or 50-74%  FIM - Upper Body Dressing/Undressing Upper body dressing/undressing steps patient completed: Thread/unthread right sleeve of pullover shirt/dresss;Thread/unthread left sleeve of pullover shirt/dress Upper body dressing/undressing: 3: Mod-Patient completed 50-74% of tasks FIM - Lower Body Dressing/Undressing Lower body dressing/undressing steps patient completed: Pull pants up/down Lower body dressing/undressing: 1: Total-Patient completed less than 25% of tasks  FIM - Toileting Toileting: 0: Activity did not occur  FIM - ArchivistToilet Transfers Toilet Transfers: 0-Activity did not occur  FIM - BankerBed/Chair Transfer Bed/Chair Transfer Assistive Devices: Environmental consultantWalker;Bed rails;Arm rests;HOB elevated Bed/Chair Transfer: 2: Bed > Chair or  W/C: Max A (lift and lower assist);2: Sit > Supine: Max A (lifting assist/Pt. 25-49%);2: Supine > Sit: Max A (lifting assist/Pt. 25-49%)  FIM - Locomotion: Wheelchair Distance:  30 Locomotion: Wheelchair: 1: Travels less than 50 ft with minimal assistance (Pt.>75%) FIM - Locomotion: Ambulation Locomotion: Ambulation Assistive Devices: Designer, industrial/productWalker - Rolling Ambulation/Gait Assistance: 2: Max assist Locomotion: Ambulation: 1: Travels less than 50 ft with maximal assistance (Pt: 25 - 49%)  Comprehension Comprehension Mode: Auditory Comprehension: 6-Follows complex conversation/direction: With extra time/assistive device  Expression Expression Mode: Verbal Expression: 6-Expresses complex ideas: With extra time/assistive device  Social Interaction Social Interaction: 7-Interacts appropriately with others - No medications needed.  Problem Solving Problem Solving: 6-Solves complex problems: With extra time  Memory Memory: 5-Recognizes or recalls 90% of the time/requires cueing < 10% of the time  Medical Problem List and Plan:  1. Left femoral neck fracture after a fall. Status post left hip hemiarthroplasty 04/08/2014. Partial weightbearing with posterior hip precautions   -pt remains quite motivated 2. DVT Prophylaxis/Anticoagulation: Subcutaneous Lovenox. Monitor platelet counts any signs of bleeding. Check vascular study today 3. Pain Management: Oxycodone and Robaxin as needed. Monitor with increased mobility  4. Neuropsych: This patient is capable of making decisions on his own behalf.  5. Acute blood loss anemia. Followup CBC.  6. Pseudomonas urinary tract infection. continue Cipro  7. Diabetes mellitus with peripheral neuropathy. Hemoglobin A1c 6.8. Lantus insulin 15 units each bedtime. Check blood sugars a.c. and at bedtime   -fair control at present 8. Hypertension. Norvasc 10 mg daily, Tenormin 100 mg daily, lisinopril 40 mg daily. Monitor with increased mobility  9. Hypothyroidism. Synthroid. Latest TSH level 4.400  10. Hyperlipidemia. Lipitor/ Lopid  LOS (Days) 2 A FACE TO FACE EVALUATION WAS PERFORMED  Ranelle OysterZachary T Swartz 04/12/2014 8:21 AM

## 2014-04-13 ENCOUNTER — Inpatient Hospital Stay (HOSPITAL_COMMUNITY): Payer: Worker's Compensation | Admitting: Physical Therapy

## 2014-04-13 ENCOUNTER — Inpatient Hospital Stay (HOSPITAL_COMMUNITY): Payer: Self-pay | Admitting: Rehabilitation

## 2014-04-13 ENCOUNTER — Inpatient Hospital Stay (HOSPITAL_COMMUNITY): Payer: Worker's Compensation

## 2014-04-13 ENCOUNTER — Encounter (HOSPITAL_COMMUNITY): Payer: Self-pay

## 2014-04-13 DIAGNOSIS — S72009A Fracture of unspecified part of neck of unspecified femur, initial encounter for closed fracture: Secondary | ICD-10-CM

## 2014-04-13 DIAGNOSIS — E119 Type 2 diabetes mellitus without complications: Secondary | ICD-10-CM

## 2014-04-13 DIAGNOSIS — W19XXXA Unspecified fall, initial encounter: Secondary | ICD-10-CM

## 2014-04-13 DIAGNOSIS — I10 Essential (primary) hypertension: Secondary | ICD-10-CM

## 2014-04-13 DIAGNOSIS — N390 Urinary tract infection, site not specified: Secondary | ICD-10-CM

## 2014-04-13 LAB — CBC WITH DIFFERENTIAL/PLATELET
BASOS ABS: 0 10*3/uL (ref 0.0–0.1)
BASOS PCT: 0 % (ref 0–1)
EOS ABS: 0.2 10*3/uL (ref 0.0–0.7)
Eosinophils Relative: 3 % (ref 0–5)
HCT: 30.7 % — ABNORMAL LOW (ref 36.0–46.0)
Hemoglobin: 10.4 g/dL — ABNORMAL LOW (ref 12.0–15.0)
Lymphocytes Relative: 21 % (ref 12–46)
Lymphs Abs: 1.6 10*3/uL (ref 0.7–4.0)
MCH: 32.8 pg (ref 26.0–34.0)
MCHC: 33.9 g/dL (ref 30.0–36.0)
MCV: 96.8 fL (ref 78.0–100.0)
Monocytes Absolute: 0.5 10*3/uL (ref 0.1–1.0)
Monocytes Relative: 6 % (ref 3–12)
NEUTROS ABS: 5.3 10*3/uL (ref 1.7–7.7)
NEUTROS PCT: 70 % (ref 43–77)
Platelets: 211 10*3/uL (ref 150–400)
RBC: 3.17 MIL/uL — ABNORMAL LOW (ref 3.87–5.11)
RDW: 14.1 % (ref 11.5–15.5)
WBC: 7.6 10*3/uL (ref 4.0–10.5)

## 2014-04-13 LAB — GLUCOSE, CAPILLARY
GLUCOSE-CAPILLARY: 134 mg/dL — AB (ref 70–99)
GLUCOSE-CAPILLARY: 188 mg/dL — AB (ref 70–99)
Glucose-Capillary: 151 mg/dL — ABNORMAL HIGH (ref 70–99)
Glucose-Capillary: 146 mg/dL — ABNORMAL HIGH (ref 70–99)

## 2014-04-13 LAB — COMPREHENSIVE METABOLIC PANEL
ALBUMIN: 2.2 g/dL — AB (ref 3.5–5.2)
ALT: 8 U/L (ref 0–35)
AST: 11 U/L (ref 0–37)
Alkaline Phosphatase: 108 U/L (ref 39–117)
BILIRUBIN TOTAL: 0.5 mg/dL (ref 0.3–1.2)
BUN: 23 mg/dL (ref 6–23)
CHLORIDE: 106 meq/L (ref 96–112)
CO2: 23 mEq/L (ref 19–32)
Calcium: 9.1 mg/dL (ref 8.4–10.5)
Creatinine, Ser: 1.07 mg/dL (ref 0.50–1.10)
GFR calc Af Amer: 53 mL/min — ABNORMAL LOW (ref >90)
GFR calc non Af Amer: 46 mL/min — ABNORMAL LOW (ref >90)
Glucose, Bld: 150 mg/dL — ABNORMAL HIGH (ref 70–99)
Potassium: 4.2 mEq/L (ref 3.7–5.3)
Sodium: 142 mEq/L (ref 137–147)
TOTAL PROTEIN: 6.3 g/dL (ref 6.0–8.3)

## 2014-04-13 MED ORDER — SENNOSIDES-DOCUSATE SODIUM 8.6-50 MG PO TABS
2.0000 | ORAL_TABLET | Freq: Two times a day (BID) | ORAL | Status: DC
  Administered 2014-04-13 – 2014-04-23 (×20): 2 via ORAL
  Filled 2014-04-13: qty 1
  Filled 2014-04-13 (×19): qty 2

## 2014-04-13 NOTE — Progress Notes (Signed)
Physical Therapy Session Note  Patient Details  Name: April [name] MRN: [id number] Date of Birth: 06/08/1925  Today's Date: 04/13/2014 Time: [id number] Time Calculation (min): 46 min  Short Term Goals: Week 1:  PT Short Term Goal 1 (Week 1): Pt will perform supine <> sit with mod A PT Short Term Goal 2 (Week 1): Pt will perform transfers bed <> chair with min A PT Short Term Goal 3 (Week 1): Pt will perform gait in controlled environment x 25' with mod A  PT Short Term Goal 4 (Week 1): Pt will perform w/c mobility x 150' with min A PT Short Term Goal 5 (Week 1): Pt will negotiate 3 stairs with 2 rails and mod A  Skilled Therapeutic Interventions/Progress Updates:    Pt received semi-reclined in bed with daughters and family friend present. Pt agreeable to session. 1L supplemental O2, [place] throughout session with SpO2 >90% throughout session, with exception of SpO2 at 89% following stand pivot transfer; SpO2 increased to 96& within 10 seconds of verbal cueing to focus on breathing. Pt able to independently verbalize 2 of 3 precautions; able to verbalize all precautions with mod I using paper handout. Performed supine>sit with HOB elevated 20 degrees without rails requiring min A (lifting at trunk) and mod verbal cueing for technique, adherence to precautions (avoiding hip internal rotation). Performed stand pivot transfer from bed>w/c with rolling walker requiring mod A and verbal/tactile cueing to adhere to 50% PWB on LLE.  Transported pt to w/c to hallway, where pt performed gait x5' in controlled environment with rolling walker and mod A, mod verbal cueing for adherence to 50% PWB LLE with effective return demonstration; gait trial ended secondary to RLE fatigue. Transported pt in w/c to room, where pt was left seated in w/c with daughters present, on 1L supplemental O2 via [place], and all needs within reach,  Therapy Documentation Precautions:  Precautions Precautions: Fall;Posterior  Hip Precaution Booklet Issued: Yes (comment) Precaution Comments: Reviewed precautions Required Braces or Orthoses: Knee Immobilizer - Left Restrictions Weight Bearing Restrictions: Yes LLE Weight Bearing: Partial weight bearing LLE Partial Weight Bearing Percentage or Pounds: 50% Vital Signs: Therapy Vitals Pulse Rate: 63 Patient Position, if appropriate: Lying Oxygen Therapy SpO2: 96 % O2 Device: Nasal cannula O2 Flow Rate (L/min): 1 L/min Pain: Pain Assessment Pain Assessment: No/denies pain Locomotion: Ambulation Ambulation/Gait Assistance: 3: Mod assist   See FIM for current functional status  Therapy/Group: Individual Therapy  Lorenda IshiharaBlair A Zalen Sequeira 04/13/2014, 3:55 PM

## 2014-04-13 NOTE — Progress Notes (Addendum)
Physical Therapy Note  Patient Details  Name: April [name] MRN: [id number] Date of Birth: 01/25/1925 Today's Date: 04/13/2014 1350-1420, 30 min No pain reported  on 1.5 L O2/min via [place]  Pt fatigued, recently returned to bed.  Agreeable to bedside therapeutic exercise to improve functional mobility.  Pt performed :  R and L short arc knee ext with isometric holds at end range  R active, L assistive straight leg raise  R active and L assistive hip abduction  R and L ankle pumps with eversion  bil hip ER, R active, L assistive  bil bridging with limited knee flex bil   10 x 1 each ex. O2 sats 94% lying, via [place].  Pt left with all needs in place, bed alarm on.  Susa LofflerCaroline O Giuliana Handyside 04/13/2014, 2:04 PM

## 2014-04-13 NOTE — Progress Notes (Signed)
Occupational Therapy Session Note  Patient Details  Name: April MuldersValerie L Gorgas MRN: [id number] Date of Birth: 01/19/1925  Today's Date: 04/13/2014 Time: 0832-0930 Time Calculation (min): 58 min  Short Term Goals: Week 1:  OT Short Term Goal 1 (Week 1): Patient will recall 3/3 posterior hip precautions during ADL session. OT Short Term Goal 2 (Week 1): Patient will complete bathing with minimum assistance with use of adaptive equipment. OT Short Term Goal 3 (Week 1): Patient will complete upper body dressing with modified independence. OT Short Term Goal 4 (Week 1): Patient will complete toileting with moderate assistance. OT Short Term Goal 5 (Week 1): Patient will complete lower body dressing with minimum assistance with use of adaptive equipment.  Skilled Therapeutic Interventions/Progress Updates:    Pt seen for ADL retraining with focus on functional transfers, activity tolerance, and use of AE. Pt received supine in bed. Pt recalled PWB precaution and 2/3 precautions. Pt on room air and SpO2 86%. Pt completed remainder of therapy session on 1L O2 and SpO2>98% throughout. Completed stand pivot transfer bed>w/c with mod assist using RW. Engaged in bathing at sink as pt declined shower secondary to fear of increasing pain in shower. Pt reported "maybe tomorrow" she would take a shower.  Pt with carryover of precautions during bathing and dressing with min cues. Completed sit<>stand with mod assist x2 with max cues of encouragement. Educated on use of reacher for LB dressing. Pt required min assist for use of reacher. Pt fatigued at end of session and left sitting in w/c with all needs in reach.   Therapy Documentation Precautions:  Precautions Precautions: Fall;Posterior Hip Precaution Booklet Issued: Yes (comment) Precaution Comments: Reviewed precautions Required Braces or Orthoses: Knee Immobilizer - Left Restrictions Weight Bearing Restrictions: Yes LLE Weight Bearing: Partial weight  bearing LLE Partial Weight Bearing Percentage or Pounds: 50% General:   Vital Signs: Oxygen Therapy SpO2: 97 % O2 Device: Nasal cannula O2 Flow Rate (L/min): 1 L/min Pain: 8/10 pain at left hip. Pt received pain meds  See FIM for current functional status  Therapy/Group: Individual Therapy  Faraaz Wolin N Teighlor Korson 04/13/2014, 10:27 AM

## 2014-04-13 NOTE — Progress Notes (Signed)
Patient information reviewed and entered into eRehab system by Nolie Bignell, RN, CRRN, PPS Coordinator.  Information including medical coding and functional independence measure will be reviewed and updated through discharge.     Per nursing patient was given "Data Collection Information Summary for Patients in Inpatient Rehabilitation Facilities with attached "Privacy Act Statement-Health Care Records" upon admission.  

## 2014-04-13 NOTE — Progress Notes (Signed)
[place] PHYSICAL MEDICINE & REHABILITATION     PROGRESS NOTE    Subjective/Complaints: Had a quiet day. No new problems. Pain controlled. Good appetite A 12 point review of systems has been performed and if not noted above is otherwise negative.    Objective: Vital Signs: Blood pressure 132/65, pulse 63, temperature 98.3 F (36.8 C), temperature source Oral, resp. rate 16, height 5\' 8"  (1.727 m), weight 82.8 kg (182 lb 8.7 oz), SpO2 93.00%. No results found.  Recent Labs  04/13/14 0530  WBC 7.6  HGB 10.4*  HCT 30.7*  PLT 211    Recent Labs  04/13/14 0530  NA 142  K 4.2  CL 106  GLUCOSE 150*  BUN 23  CREATININE 1.07  CALCIUM 9.1   CBG (last 3)   Recent Labs  04/12/14 1623 04/12/14 2100 04/13/14 0656  GLUCAP 143* 180* 151*    Wt Readings from Last 3 Encounters:  04/10/14 82.8 kg (182 lb 8.7 oz)  04/07/14 81.647 kg (180 lb)  04/07/14 81.647 kg (180 lb)    Physical Exam:  Constitutional: She is oriented to person, place, and time. She appears well-developed.  HENT: oral mucosa pink and moist. Dentition fair  Head: Normocephalic.  Eyes: EOM are normal.  Neck: Normal range of motion. Neck supple. No thyromegaly present.  Cardiovascular: Normal rate and regular rhythm. No murmurs, rubs, or gallops  Respiratory: Effort normal and breath sounds normal. No respiratory distress. No wheezes, rales, or rhonchi  GI: Soft. Bowel sounds are normal. She exhibits no distension. Non-tender  Skin:  Hip incision clean and dry with staples--no change Psychiatric: She has a normal mood and affect. Very pleasant  Neuro: A and O x 3.  motor strength bilateral upper extremities 5/5 in the , bicep, tricep, grip, shoulders limited by RTC/OA and are approximately 3+.  Right lower extremity 4/5 in the hip flexor knee extensor ankle dorsiflexor plantar flexor  1/5 left hip flexor, 2- KE, 4/5 left ankle dorsiflexor plantar flexor.  Sensory intact to light touch in the upper  and lower limbs. Cognitively intact. CN exam normal   Assessment/Plan: 1. Functional deficits secondary to left femoral neck fracture after fall s/p left hip hemiarthroplasty which require 3+ hours per day of interdisciplinary therapy in a comprehensive inpatient rehab setting. Physiatrist is providing close team supervision and 24 hour management of active medical problems listed below. Physiatrist and rehab team continue to assess barriers to discharge/monitor patient progress toward functional and medical goals. FIM: FIM - Bathing Bathing Steps Patient Completed: Chest;Left Arm;Abdomen;Front perineal area Bathing: 2: Max-Patient completes 3-4 3117f 10 parts or 25-49%  FIM - Upper Body Dressing/Undressing Upper body dressing/undressing steps patient completed: Thread/unthread right sleeve of pullover shirt/dresss;Thread/unthread left sleeve of pullover shirt/dress Upper body dressing/undressing: 3: Mod-Patient completed 50-74% of tasks FIM - Lower Body Dressing/Undressing Lower body dressing/undressing steps patient completed: Pull pants up/down Lower body dressing/undressing: 1: Total-Patient completed less than 25% of tasks  FIM - Toileting Toileting: 0: Activity did not occur  FIM - ArchivistToilet Transfers Toilet Transfers: 0-Activity did not occur  FIM - BankerBed/Chair Transfer Bed/Chair Transfer Assistive Devices: Environmental consultantWalker;Arm rests Bed/Chair Transfer: 2: Bed > Chair or W/C: Max A (lift and lower assist);2: Sit > Supine: Max A (lifting assist/Pt. 25-49%);2: Supine > Sit: Max A (lifting assist/Pt. 25-49%)  FIM - Locomotion: Wheelchair Distance: 30 Locomotion: Wheelchair: 1: Travels less than 50 ft with minimal assistance (Pt.>75%) FIM - Locomotion: Ambulation Locomotion: Ambulation Assistive Devices: Designer, industrial/productWalker - Rolling Ambulation/Gait Assistance:  2: Max assist Locomotion: Ambulation: 1: Travels less than 50 ft with maximal assistance (Pt: 25 - 49%)  Comprehension Comprehension Mode:  Auditory Comprehension: 6-Follows complex conversation/direction: With extra time/assistive device  Expression Expression Mode: Verbal Expression: 6-Expresses complex ideas: With extra time/assistive device  Social Interaction Social Interaction: 7-Interacts appropriately with others - No medications needed.  Problem Solving Problem Solving: 6-Solves complex problems: With extra time  Memory Memory: 6-Assistive device: No helper  Medical Problem List and Plan:  1. Left femoral neck fracture after a fall. Status post left hip hemiarthroplasty 04/08/2014. Partial weightbearing with posterior hip precautions   -pt remains quite motivated 2. DVT Prophylaxis/Anticoagulation: Subcutaneous Lovenox. Monitor platelet counts any signs of bleeding.   -dopplers negative 3. Pain Management: Oxycodone and Robaxin as needed. Monitor with increased mobility  4. Neuropsych: This patient is capable of making decisions on his own behalf.  5. Acute blood loss anemia. Followup CBC with improved hgb 6. Pseudomonas urinary tract infection. continue Cipro  7. Diabetes mellitus with peripheral neuropathy. Hemoglobin A1c 6.8. Lantus insulin 15 units each bedtime. Check blood sugars a.c. and at bedtime   -fair control at present 8. Hypertension. Norvasc 10 mg daily, Tenormin 100 mg daily, lisinopril 40 mg daily. Monitor with increased mobility  9. Hypothyroidism. Synthroid. Latest TSH level 4.400  10. Hyperlipidemia. Lipitor/ Lopid    LOS (Days) 3 [name] 04/13/2014 8:22 AM

## 2014-04-13 NOTE — Progress Notes (Signed)
Social Work Assessment and Plan Social Work Assessment and Plan  Patient Details  Name: April [name] MRN: [id number] Date of Birth: [birth year]  Today's Date: 04/13/2014  Problem List:  Patient Active Problem List   Diagnosis Date Noted  . Hip fracture 04/10/2014  . Fracture of femoral neck, left 04/08/2014  . Left displaced femoral neck fracture 04/07/2014  . HTN (hypertension) 04/07/2014  . Hyperlipidemia 04/07/2014  . DM (diabetes mellitus) 04/07/2014  . Hypothyroidism 04/07/2014  . OA (osteoarthritis) 04/07/2014  . UTI (lower urinary tract infection) 04/07/2014  . Dehydration 04/07/2014   Past Medical History:  Past Medical History  Diagnosis Date  . Hypertension   . Diabetes mellitus without complication   . HTN (hypertension) 04/07/2014  . Hyperlipidemia 04/07/2014  . Hypothyroidism 04/07/2014  . OA (osteoarthritis) 04/07/2014   Past Surgical History:  Past Surgical History  Procedure Laterality Date  . Cholecystectomy    . Hip arthroplasty Left 04/08/2014    Procedure: LEFT HIP HEMIARTHROPLASTY;  Surgeon: Augustin Schooling, MD;  Location: [facility];  Service: Orthopedics;  Laterality: Left;   Social History:  reports that she has never smoked. She does not have any smokeless tobacco history on file. She reports that she does not drink alcohol or use illicit drugs.  Family / Support Systems Marital Status: Widow/Widower Patient Roles: Parent;Other (Comment) (Employee) Children: April [name]-daughter [phone]  [phone]   April [name]-daughter [phone] Other Supports: April [name]  [phone] Anticipated Caregiver: April [name]-daughter lives with her.  Patient wants to be mod/i level Ability/Limitations of Caregiver: April [name] is disabled and on [place] benefits-can not provide much physical care Caregiver Availability: 24/7 Family Dynamics: Close knit family all three of her children are supportive and involved.  Her daughter April [name] lives with her and can assist with some  of her care-but is limited herself.  Pt plans to be mod/i level and do most of her care on her own.    Social History Preferred language: English Religion: None Cultural Background: No issues Education: [employer] Educated Read: Yes Write: Yes Employment Status: Employed Name of Employer: [employer]-10 years Return to Work Plans: Unsure would like to but depends how she heals Freight forwarder Issues: No issues-fall is workers comp due to fell at work Guardian/Conservator: None-according to MD pt is capable of making her own decisions while here   Abuse/Neglect Physical Abuse: Denies Verbal Abuse: Denies Sexual Abuse: Denies Exploitation of patient/patient's resources: Denies Self-Neglect: Denies  Emotional Status Pt's affect, behavior adn adjustment status: Pt is motivated and wants to do her best, her main issue is pain from her hip.  She is trying to do what the team wants her to do and to endure the pain.  She is aware she will have some while trying to move in therapies.  She has always been independent and wants to regain this while here. Recent Psychosocial Issues: Other medical issues-feels she has been blessed thorughout her life. Pyschiatric History: No issues-deferred depression screen due to pt felt it was not necessary and feels this is to be expected.  Will monitor while here Substance Abuse History: No issues  Patient / Family Perceptions, Expectations & Goals Pt/Family understanding of illness & functional limitations: Pt and daughter have a good understanding of her hip fracture and precautions.  She has trouble with WB issues but feel she is learning.  She is trying to listen and do her best in therapies to make progress while here.  She speaks to the MD daily on his rounds and feels  her needs are being met. 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, etc Anticipated changes in roles/activities/participation:  resume Pt/family expectations/goals: Pt states; ' I want to be as independent as I can be before I leave here, so to help my daughter out."  [employer]: None Premorbid Home Care/DME Agencies: None Transportation available at discharge: Family  Discharge Planning Living Arrangements: [place]: Children;Other relatives;Friends/neighbors;Church/faith community Type of Residence: Private residence Insurance Resources: Medicare;Medicaid (specify county);Multimedia programmer (specify) (Workers Comp) Pensions consultant: Biomedical engineer Screen Referred: No Living Expenses: Own Money Management: Patient Does the patient have any problems obtaining your medications?: No Home Management: Patient and daughter Patient/Family Preliminary Plans: Return home with daughter who can assist some, pt really wants to be mod/i before going home.  her goals are set for this and she si willing to put forth the effort to achieve this. Social Work Anticipated Follow Up Needs: HH/OP;Support Group  Clinical Impression Pleasant motivated female who is ready to work and make progress.  Her daughter lives with her and can assist with light care.  Aware will be working with Workers Comp case worker to make any Development worker, international aid.  Should do well here.  Very active 80 year old woman  April [name] 04/13/2014, 12:43 PM

## 2014-04-13 NOTE — Progress Notes (Signed)
Physical Therapy Session Note  Patient Details  Name: April MuldersValerie L Wonder MRN: [id number] Date of Birth: 07/18/1925  Today's Date: 04/13/2014 Time: [id number] Time Calculation (min): 58 min  Short Term Goals: Week 1:  PT Short Term Goal 1 (Week 1): Pt will perform supine <> sit with mod A PT Short Term Goal 2 (Week 1): Pt will perform transfers bed <> chair with min A PT Short Term Goal 3 (Week 1): Pt will perform gait in controlled environment x 25' with mod A  PT Short Term Goal 4 (Week 1): Pt will perform w/c mobility x 150' with min A PT Short Term Goal 5 (Week 1): Pt will negotiate 3 stairs with 2 rails and mod A  Skilled Therapeutic Interventions/Progress Updates:   Pt received sitting in w/c in room, agreeable to therapy this morning.  Discussed with RN order for O2 during therapy.  RN confirms that she needs to be on at least 1 L of O2 during therapy.  Assisted pt down to therapy gym in order to work on supine therex, sit <> stand, pregait, and gait with RW. Performed stand pivot transfer to mat at mod assist.  Provided mod verbal and demonstration cues for hand placement and LLE placement in order to maintain THP.  Once on mat, assisted pt into supine with mod assist, elevating LLE into bed and assisting with trunk with max verbal cues for maintaining THP.  Once in supine, questioned pt about THP, she was able to state WB precautions and 2/3 THP with cues for no IR.  Performed ankle pumps x 20 reps, quad sets x 10 reps LLE, heel slides x 10 reps LLE, and hip abd x 10 reps LLE (AAROM).  Assisted back into supine, and performed 6 reps of sit <> stand from elevated mat surface at min/guard assist with continued cues for hand placement and safety.  On 6th rep, had pt perform forward/retro stepping x 10 reps with RLE to increase WB on LLE with cues for increasing UE WB also to maintain 50% WB.  Ended session with gait x 6' then x 10' with [facility] at [facility] with cues for sequencing/technqiue with RW,  upright posture and safety when turning.  Assisted pt back to room and pt left in w/c in room with all needs in reach.  Ice pack placed on L hip for pain control and inflammation.   Therapy Documentation Precautions:  Precautions Precautions: Fall;Posterior Hip Precaution Booklet Issued: Yes (comment) Precaution Comments: Reviewed precautions Required Braces or Orthoses: Knee Immobilizer - Left Restrictions Weight Bearing Restrictions: Yes LLE Weight Bearing: Partial weight bearing LLE Partial Weight Bearing Percentage or Pounds: 50%   Vital Signs: Oxygen Therapy SpO2: 97 % O2 Device: Nasal cannula O2 Flow Rate (L/min): 1 L/min Pain: Pain Assessment Pain Assessment: 0-10 Pain Score: 8  Pain Type: Acute pain Pain Location: Hip Pain Orientation: Left Pain Descriptors / Indicators: Aching Pain Frequency: Intermittent Pain Onset: Gradual Patients Stated Pain Goal: 2 Pain Intervention(s): Medication (See eMAR)   Locomotion : Ambulation Ambulation/Gait Assistance: 3: Mod assist   See FIM for current functional status  Therapy/Group: Individual Therapy  Irving Burtonmily A Yonas Bunda 04/13/2014, 12:07 PM

## 2014-04-13 NOTE — Care Management Note (Signed)
Inpatient Rehabilitation Center Individual Statement of Services  Patient Name:  April [name]  Date:  04/13/2014  Welcome to the Inpatient Rehabilitation Center.  Our goal is to provide you with an individualized program based on your diagnosis and situation, designed to meet your specific needs.  With this comprehensive rehabilitation program, you will be expected to participate in at least 3 hours of rehabilitation therapies Monday-Friday, with modified therapy programming on the weekends.  Your rehabilitation program will include the following services:  Physical Therapy (PT), Occupational Therapy (OT), 24 hour per day rehabilitation nursing, Case Management (Social Worker), Rehabilitation Medicine, Nutrition Services and Pharmacy Services  Weekly team conferences will be held on Tuesday to discuss your progress.  Your Social Worker will talk with you frequently to get your input and to update you on team discussions.  Team conferences with you and your family in attendance may also be held.  Expected length of stay: 10-12 days  Overall anticipated outcome: supervision/mod/i level  Depending on your progress and recovery, your program may change. Your Social Worker will coordinate services and will keep you informed of any changes. Your Social Worker's name and contact numbers are listed  below.  The following services may also be recommended but are not provided by the Inpatient Rehabilitation Center:   Driving Evaluations  Home Health Rehabiltiation Services  Outpatient Rehabilitation Services  Vocational Rehabilitation   Arrangements will be made to provide these services after discharge if needed.  Arrangements include referral to agencies that provide these services.  Your insurance has been verified to be:  Workers Comp Your primary doctor is:  Dr Viviann SpareSteven Burdine  Pertinent information will be shared with your doctor and your insurance company.  Social Worker:  Dossie DerBecky  [place], [phone] or ([phone]) [phone]  Information discussed with and copy given to patient by: Lucy Chrisebecca G Evalene Vath, 04/13/2014, 12:19 PM

## 2014-04-14 ENCOUNTER — Inpatient Hospital Stay (HOSPITAL_COMMUNITY): Payer: Worker's Compensation | Admitting: *Deleted

## 2014-04-14 ENCOUNTER — Inpatient Hospital Stay (HOSPITAL_COMMUNITY): Payer: Self-pay | Admitting: Physical Therapy

## 2014-04-14 ENCOUNTER — Inpatient Hospital Stay (HOSPITAL_COMMUNITY): Payer: Worker's Compensation

## 2014-04-14 ENCOUNTER — Inpatient Hospital Stay (HOSPITAL_COMMUNITY): Payer: Worker's Compensation | Admitting: Occupational Therapy

## 2014-04-14 DIAGNOSIS — S72009A Fracture of unspecified part of neck of unspecified femur, initial encounter for closed fracture: Secondary | ICD-10-CM

## 2014-04-14 DIAGNOSIS — E119 Type 2 diabetes mellitus without complications: Secondary | ICD-10-CM

## 2014-04-14 DIAGNOSIS — I10 Essential (primary) hypertension: Secondary | ICD-10-CM

## 2014-04-14 DIAGNOSIS — W19XXXA Unspecified fall, initial encounter: Secondary | ICD-10-CM

## 2014-04-14 DIAGNOSIS — N390 Urinary tract infection, site not specified: Secondary | ICD-10-CM

## 2014-04-14 LAB — GLUCOSE, CAPILLARY
GLUCOSE-CAPILLARY: 163 mg/dL — AB (ref 70–99)
GLUCOSE-CAPILLARY: 154 mg/dL — AB (ref 70–99)
Glucose-Capillary: 155 mg/dL — ABNORMAL HIGH (ref 70–99)
Glucose-Capillary: 142 mg/dL — ABNORMAL HIGH (ref 70–99)

## 2014-04-14 NOTE — Progress Notes (Signed)
Physical Therapy Session Note  Patient Details  Name: April [name] MRN: [id number] Date of Birth: 02/17/1925  Today's Date: 04/14/2014 Time: [id number] Time Calculation (min): 50 min  Short Term Goals: Week 1:  PT Short Term Goal 1 (Week 1): Pt will perform supine <> sit with mod A PT Short Term Goal 2 (Week 1): Pt will perform transfers bed <> chair with min A PT Short Term Goal 3 (Week 1): Pt will perform gait in controlled environment x 25' with mod A  PT Short Term Goal 4 (Week 1): Pt will perform w/c mobility x 150' with min A PT Short Term Goal 5 (Week 1): Pt will negotiate 3 stairs with 2 rails and mod A  Skilled Therapeutic Interventions/Progress Updates:    Pt received seated in w/c accompanied by daughters; agreeable to therapy. SpO2 97% on RA; SpO2 levels >95% (on RA) during activity throughout session. Per pt request, performed toilet transfer with mod A using grab bars. Static sitting balance x5 minutes with mod I, intermittent RUE support at grab bar; dynamic sitting balance with close supervision to min A. Mod verbal cueing required for adherence to L posterior hip precautions during toileting. Transported pt in w/c to ortho gym, where pt negotiated 3 (4") stairs with R rail, L HHA (to simulate primary entrance of home) ascended forward-facing, descended backwards with step-to pattern and mod A, min verbal cueing for sequencing with effective return demonstration. Transported pt in w/c to room, where pt performed stand pivot transfer from w/c>bed with rolling walker and min A. Sit>supine with supervision, increased time with HOB flat, no rails, and max multimodal cueing for technique, LLE management. Therapist departed with pt semi-reclined in bed with bed alarm on, 3 bed rails up, daughters present, and all needs within reach.  Therapy Documentation Precautions:  Precautions Precautions: Fall;Posterior Hip Precaution Booklet Issued: Yes (comment) Precaution Comments:  Reviewed precautions Required Braces or Orthoses: Knee Immobilizer - Left Restrictions Weight Bearing Restrictions: Yes LLE Weight Bearing: Partial weight bearing LLE Partial Weight Bearing Percentage or Pounds: 50% General: Amount of Missed PT Time (min): 10 Minutes Missed Time Reason: Patient fatigue Vital Signs: Therapy Vitals Temp: 97.6 F (36.4 C) Temp src: Oral Pulse Rate: 61 Resp: 18 BP: 109/63 mmHg Patient Position, if appropriate: Sitting Oxygen Therapy SpO2: 97 % O2 Device: None (Room air) Pain: Pain Assessment Pain Assessment: No/denies pain  See FIM for current functional status  Therapy/Group: Individual Therapy  Lorenda IshiharaBlair A Hobble 04/14/2014, 6:27 PM

## 2014-04-14 NOTE — Progress Notes (Signed)
[place] PHYSICAL MEDICINE & REHABILITATION     PROGRESS NOTE    Subjective/Complaints: Had a busy day. Tired by the end of it!! Feeling well A 12 point review of systems has been performed and if not noted above is otherwise negative.    Objective: Vital Signs: Blood pressure 136/71, pulse 65, temperature 98 F (36.7 C), temperature source Oral, resp. rate 16, height $RemoveBeforeDEI _[id number]$5\' 8"2.00%. No results found.  Recent Labs  04/13/14 0530  WBC 7.6  HGB 10.4*  HCT 30.7*  PLT 211    Recent Labs  04/13/14 0530  NA 142  K 4.2  CL 106  GLUCOSE 150*  BUN 23  CREATININE 1.07  CALCIUM 9.1   CBG (last 3)   Recent Labs  04/13/14 1649 04/13/14 2048 04/14/14 0727  GLUCAP 134* 188* 155*    Wt Readings from Last 3 Encounters:  04/10/14 82.8 kg (182 lb 8.7 oz)  04/07/14 81.647 kg (180 lb)  04/07/14 81.647 kg (180 lb)    Physical Exam:  Constitutional: She is oriented to person, place, and time. She appears well-developed.  HENT: oral mucosa pink and moist. Dentition fair  Head: Normocephalic.  Eyes: EOM are normal.  Neck: Normal range of motion. Neck supple. No thyromegaly present.  Cardiovascular: Normal rate and regular rhythm. No murmurs, rubs, or gallops  Respiratory: Effort normal and breath sounds normal. No respiratory distress. No wheezes, rales, or rhonchi  GI: Soft. Bowel sounds are normal. She exhibits no distension. Non-tender  Skin:  Hip incision clean and dry, staples intact Psychiatric: She has a normal mood and affect. Very pleasant  Neuro: A and O x 3.  motor strength bilateral upper extremities 5/5 in the , bicep, tricep, grip, shoulders limited by RTC/OA and are approximately 3+.  Right lower extremity 4/5 in the hip flexor knee extensor ankle dorsiflexor plantar flexor  1/5 left hip flexor, 2- KE, 4/5 left ankle dorsiflexor plantar flexor.  Sensory intact to light touch in the upper and lower limbs.  Cognitively intact. CN exam normal   Assessment/Plan: 1. Functional deficits secondary to left femoral neck fracture after fall s/p left hip hemiarthroplasty which require 3+ hours per day of interdisciplinary therapy in a comprehensive inpatient rehab setting. Physiatrist is providing close team supervision and 24 hour management of active medical problems listed below. Physiatrist and rehab team continue to assess barriers to discharge/monitor patient progress toward functional and medical goals. FIM: FIM - Bathing Bathing Steps Patient Completed: Chest;Left Arm;Abdomen;Front perineal area;Right upper leg;Left upper leg;Right Arm Bathing: 3: Mod-Patient completes 5-7 6352f 10 parts or 50-74%  FIM - Upper Body Dressing/Undressing Upper body dressing/undressing steps patient completed: Thread/unthread right sleeve of pullover shirt/dresss;Thread/unthread left sleeve of pullover shirt/dress;Put head through opening of pull over shirt/dress;Pull shirt over trunk Upper body dressing/undressing: 5: Set-up assist to: Obtain clothing/put away FIM - Lower Body Dressing/Undressing Lower body dressing/undressing steps patient completed: Pull pants up/down Lower body dressing/undressing: 1: Total-Patient completed less than 25% of tasks  FIM - Toileting Toileting: 0: Activity did not occur  FIM - ArchivistToilet Transfers Toilet Transfers: 0-Activity did not occur  FIM - BankerBed/Chair Transfer Bed/Chair Transfer Assistive Devices: Arm rests;Walker;HOB elevated (HOB elevated 20 degrees) Bed/Chair Transfer: 3: Bed > Chair or W/C: Mod A (lift or lower assist);3: Chair or W/C > Bed: Mod A (lift or lower assist);4: Supine > Sit: Min A (steadying Pt. > 75%/lift 1 leg)  FIM - Locomotion: Wheelchair  Distance: 30 Locomotion: Wheelchair: 1: Total Assistance/staff pushes wheelchair (Pt<25%) FIM - Locomotion: Ambulation Locomotion: Ambulation Assistive Devices: Walker - Rolling Ambulation/Gait Assistance: 3: Mod  assist Locomotion: Ambulation: 1: Travels less than 50 ft with moderate assistance (Pt: 50 - 74%)  Comprehension Comprehension Mode: Auditory Comprehension: 6-Follows complex conversation/direction: With extra time/assistive device  Expression Expression Mode: Verbal Expression: 6-Expresses complex ideas: With extra time/assistive device  Social Interaction Social Interaction: 7-Interacts appropriately with others - No medications needed.  Problem Solving Problem Solving: 6-Solves complex problems: With extra time  Memory Memory: 6-Assistive device: No helper  Medical Problem List and Plan:  1. Left femoral neck fracture after a fall. Status post left hip hemiarthroplasty 04/08/2014. Partial weightbearing with posterior hip precautions   -pt remains quite motivated 2. DVT Prophylaxis/Anticoagulation: Subcutaneous Lovenox. Monitor platelet counts any signs of bleeding.   -dopplers negative 3. Pain Management: Oxycodone and Robaxin as needed with good control 4. Neuropsych: This patient is capable of making decisions on his own behalf.  5. Acute blood loss anemia. Followup CBC with improved hgb 6. Pseudomonas urinary tract infection. continue Cipro  7. Diabetes mellitus with peripheral neuropathy. Hemoglobin A1c 6.8. Lantus insulin 15 units each bedtime. Check blood sugars a.c. and at bedtime   -fair control at present 8. Hypertension. Norvasc 10 mg daily, Tenormin 100 mg daily, lisinopril 40 mg daily. Monitor with increased mobility  9. Hypothyroidism. Synthroid. Latest TSH level 4.400  10. Hyperlipidemia. Lipitor/ Lopid    LOS (Days) 4 A FACE TO FACE EVALUATION WAS PERFORMED  Ranelle OysterZachary T Swartz 04/14/2014 7:52 AM

## 2014-04-14 NOTE — Progress Notes (Signed)
Physical Therapy Session Note  Patient Details  Name: April [name] MRN: [id number] Date of Birth: 03/06/1925  Today's Date: 04/14/2014 Time: 13:05-14:05 (60min)   Short Term Goals: Week 1:  PT Short Term Goal 1 (Week 1): Pt will perform supine <> sit with mod A PT Short Term Goal 2 (Week 1): Pt will perform transfers bed <> chair with min A PT Short Term Goal 3 (Week 1): Pt will perform gait in controlled environment x 25' with mod A  PT Short Term Goal 4 (Week 1): Pt will perform w/c mobility x 150' with min A PT Short Term Goal 5 (Week 1): Pt will negotiate 3 stairs with 2 rails and mod A Week 2:     Skilled Therapeutic Interventions/Progress Updates:    Tx focused on therx for LE strengthening, deep breathing tehcniques and pain education for anxiety reduction, gait with RW and functional mobility. Pt education on multi-factorial benefits of OOB activity and working through normal level of pain for recovery.   Pt reporting being pain all over. Addressed various areas as able and Instructed pt in relaxation breathing for in between pain med administration. Also educated pt on different types of pain and sensititivites. Pt reposnonded well to technique.   Performed folowing supine and sitting therex: - ankle pumps, quad sets, glute sets, heel slides, hip ABD/ADD, LAQ  Pt needed Mod A for supine>sit with max cues for safe technique to adhere to hip precautions.   Sit<>stand with min A x593min and x372min in standing with S only and cues to bear up to 50% for increase comfort with this weight-shifting. Performed pre-gait in standing for stepping and UE lifting with RW. Gait 1x10' with RW and min A for steadying and verbal cues for increased R step length and pressing through UEs for support.   [facility] with all needs in reach.   Therapy Documentation Precautions:  Precautions Precautions: Fall;Posterior Hip Precaution Booklet Issued: Yes (comment) Precaution Comments:  Reviewed precautions Required Braces or Orthoses: Knee Immobilizer - Left Restrictions Weight Bearing Restrictions: Yes LLE Weight Bearing: Partial weight bearing LLE Partial Weight Bearing Percentage or Pounds: 50%    Pain: 4/10 L hip pain, modified tx and instructed in deep breathing      See FIM for current functional status  Therapy/Group: Individual Therapy Clydene Lamingole Ngan Qualls, PT, DPT  04/14/2014, 1:36 PM

## 2014-04-14 NOTE — Progress Notes (Signed)
Occupational Therapy Session Note  Patient Details  Name: April [name] MRN: [id number] Date of Birth: 04/27/1925  Today's Date: 04/14/2014 Time: 0730-0830 Time Calculation (min): 60 min  Short Term Goals: Week 1:  OT Short Term Goal 1 (Week 1): Patient will recall 3/3 posterior hip precautions during ADL session. OT Short Term Goal 2 (Week 1): Patient will complete bathing with minimum assistance with use of adaptive equipment. OT Short Term Goal 3 (Week 1): Patient will complete upper body dressing with modified independence. OT Short Term Goal 4 (Week 1): Patient will complete toileting with moderate assistance. OT Short Term Goal 5 (Week 1): Patient will complete lower body dressing with minimum assistance with use of adaptive equipment.  Skilled Therapeutic Interventions/Progress Updates: ADL-retraining with focus on recall and adherence to hip precautions, safety awareness, and improved independence with lower body dressing using AE.   Patient received supine in bed, alert and attentive.   After brief re-ed on treatment goals and methods, patient deferred bathing in shower or use of clothing during this ADL session due to need for self-feeding and requested bath at sink.   After self-feeding patient completed pan bath at edge of bed with total assist required to bathe lower legs and to lace and pull up pants (pajama bottoms).  Patient was educated on use of AE (LH sponge) but required manual assist and verbal cues for technique of sit><stand using RW and SPT to w/c.   Patient completed grooming at sink in w/c and was left in w/c near bed with call light and phone within reach.    Therapy Documentation Precautions:  Precautions Precautions: Fall;Posterior Hip Precaution Booklet Issued: Yes (comment) Precaution Comments: Reviewed precautions Required Braces or Orthoses: Knee Immobilizer - Left Restrictions Weight Bearing Restrictions: Yes LLE Weight Bearing: Partial weight  bearing LLE Partial Weight Bearing Percentage or Pounds: 50%  Pain: Pain Assessment Pain Assessment: 0-10 Pain Score: 5  Pain Type: Acute pain Pain Location: Hip Pain Orientation: Left Pain Descriptors / Indicators: Aching Pain Frequency: Intermittent Pain Onset: With Activity Patients Stated Pain Goal: 1 Pain Intervention(s): Medication (See eMAR)  See FIM for current functional status  Therapy/Group: Individual Therapy  [name] 04/14/2014, 11:41 AM

## 2014-04-14 NOTE — Progress Notes (Signed)
Occupational Therapy Session Note  Patient Details  Name: April MuldersValerie L Fant MRN: [id number] Date of Birth: 05/27/1925  Today's Date: 04/14/2014 Time: 1000-1035 Time Calculation (min): 35 min  Short Term Goals: Week 1:  OT Short Term Goal 1 (Week 1): Patient will recall 3/3 posterior hip precautions during ADL session. OT Short Term Goal 2 (Week 1): Patient will complete bathing with minimum assistance with use of adaptive equipment. OT Short Term Goal 3 (Week 1): Patient will complete upper body dressing with modified independence. OT Short Term Goal 4 (Week 1): Patient will complete toileting with moderate assistance. OT Short Term Goal 5 (Week 1): Patient will complete lower body dressing with minimum assistance with use of adaptive equipment.  Skilled Therapeutic Interventions/Progress Updates:  Patient resting in bed upon arrival.  Engaged in bed mobility, bed>w/c>recliner transfers and groom while seated at sink (declined need to toilet).  Focused session on recall and adhere to hip precautions and PWB with above activity.  Patient able to recall 2/3 hip precautions and stated "50% weight only" on her LLE.  Patient required mod-max cues during all mobility to adhere to precautions.  Patient reporting not feeling very positive today and a little discouraged.  Encouragement provided.  Patient resting in recliner and RN in room upon completion.  Therapy Documentation Precautions:  Precautions Precautions: Fall;Posterior Hip Precaution Booklet Issued: Yes (comment) Precaution Comments: Reviewed precautions Required Braces or Orthoses: Knee Immobilizer - Left Restrictions Weight Bearing Restrictions: Yes LLE Weight Bearing: Partial weight bearing LLE Partial Weight Bearing Percentage or Pounds: 50% Pain: 6/10 left hip before activity, 8/10 pain after OT session.  Rest, repositioned and RN to provide medication Therapy/Group: Individual Therapy  Fredia BeetsChristina K Marcellene Shivley 04/14/2014, 10:39  AM

## 2014-04-15 ENCOUNTER — Inpatient Hospital Stay (HOSPITAL_COMMUNITY): Payer: Worker's Compensation

## 2014-04-15 ENCOUNTER — Inpatient Hospital Stay (HOSPITAL_COMMUNITY): Payer: Self-pay

## 2014-04-15 ENCOUNTER — Inpatient Hospital Stay (HOSPITAL_COMMUNITY): Payer: Worker's Compensation | Admitting: Physical Therapy

## 2014-04-15 LAB — GLUCOSE, CAPILLARY
GLUCOSE-CAPILLARY: 138 mg/dL — AB (ref 70–99)
Glucose-Capillary: 169 mg/dL — ABNORMAL HIGH (ref 70–99)
Glucose-Capillary: 128 mg/dL — ABNORMAL HIGH (ref 70–99)

## 2014-04-15 MED ORDER — OXYCODONE HCL ER 10 MG PO T12A
10.0000 mg | EXTENDED_RELEASE_TABLET | Freq: Two times a day (BID) | ORAL | Status: DC
  Administered 2014-04-15 – 2014-04-23 (×16): 10 mg via ORAL
  Filled 2014-04-15 (×16): qty 1

## 2014-04-15 NOTE — Progress Notes (Addendum)
Physical Therapy Session Note  Patient Details  Name: April [name] MRN: [id number] Date of Birth: 06/22/1925  Today's Date: 04/15/2014 Time: 1345-1430 Time Calculation (min): 45 min  Short Term Goals: Week 1:  PT Short Term Goal 1 (Week 1): Pt will perform supine <> sit with mod A PT Short Term Goal 2 (Week 1): Pt will perform transfers bed <> chair with min A PT Short Term Goal 3 (Week 1): Pt will perform gait in controlled environment x 25' with mod A  PT Short Term Goal 4 (Week 1): Pt will perform w/c mobility x 150' with min A PT Short Term Goal 5 (Week 1): Pt will negotiate 3 stairs with 2 rails and mod A  Skilled Therapeutic Interventions/Progress Updates:   Pt received sitting in w/c with family and CSW present. Pt's son-in-law reporting that temporary step would improve pt access to home with 8" steps instead of current 10-11 inch step with 1 rail. Pt agreeable to this and endorsed by therapist. Per patient report, increase in LLE pain this date (see below). Pt agreeable to participate in therapy with mod encouragement. Gait training to/from bathroom 10 ft x 2 using RW and min A. W/c propulsion 150' using BUEs for strengthening/endurance and supervision. Standing therex for BLE strengthening and increased WB tolerance through LLE using RW for UE support: heel raises, LLE marching, 1/4 squats and seated LAQ x 10-15 each. Pt transported back to room in w/c and requesting to get into bed. Performed stand pivot transfer using RW with min A and transferred sit > supine with HOB flat and no rail using leg lifter for BLE and supervision/setup for placing leg lifter on LEs. Pt able to recall 2/3 hip precautions. Pt left semi reclined in bed with family present. RN notified of pt report of pain on bottom of foot.   Therapy Documentation Precautions:  Precautions Precautions: Fall;Posterior Hip Precaution Booklet Issued: Yes (comment) Precaution Comments: Reviewed precautions Required  Braces or Orthoses:  ( ) Restrictions Weight Bearing Restrictions: Yes LLE Weight Bearing: Partial weight bearing LLE Partial Weight Bearing Percentage or Pounds: 50% Vital Signs:  94% Sp02 on room air Pain: Pain Assessment Pain Assessment: 0-10 Pain Score: 10-Worst pain ever Pain Type: Acute pain Pain Location: Hip Pain Orientation: Left Pain Descriptors / Indicators: Aching Pain Frequency: Intermittent Pain Onset: On-going Patients Stated Pain Goal: 1 Pain Intervention(s): Repositioned;Ambulation/increased activity  See FIM for current functional status  Therapy/Group: Individual Therapy  Kerney ElbeRebecca A Varner 04/15/2014, 2:44 PM

## 2014-04-15 NOTE — Progress Notes (Signed)
Social Work Patient ID: April [name], female   DOB: 05/24/1925, 80 y.o.   MRN: [id number]  Have reviewed team conference information with pt and daughter as well as WC CM.  All aware and agreeable with targeted d/c date of 4/30 with mod i goals overall.  Some questions of home modifications - tx speaking with them about this.  WC CM plans to be here tomorrow to review d/c needs and I will meet with them as well.  Continue to follow.  Amada JupiterLucy Rayland Hamed, LCSW

## 2014-04-15 NOTE — Progress Notes (Signed)
[place] PHYSICAL MEDICINE & REHABILITATION     PROGRESS NOTE    Subjective/Complaints: No new problems. Is worried that she is not pushing herself enough---I assured her otherwise A 12 point review of systems has been performed and if not noted above is otherwise negative.    Objective: Vital Signs: Blood pressure 164/52, pulse 64, temperature 98.1 F (36.7 C), temperature source Oral, resp. rate 18, height 5\' 8"  (1.727 m), weight 82.8 kg (182 lb 8.7 oz), SpO2 95.00%. No results found.  Recent Labs  04/13/14 0530  WBC 7.6  HGB 10.4*  HCT 30.7*  PLT 211    Recent Labs  04/13/14 0530  NA 142  K 4.2  CL 106  GLUCOSE 150*  BUN 23  CREATININE 1.07  CALCIUM 9.1   CBG (last 3)   Recent Labs  04/14/14 1619 04/14/14 2033 04/15/14 0717  GLUCAP 163* 154* 138*    Wt Readings from Last 3 Encounters:  04/10/14 82.8 kg (182 lb 8.7 oz)  04/07/14 81.647 kg (180 lb)  04/07/14 81.647 kg (180 lb)    Physical Exam:  Constitutional: She is oriented to person, place, and time. She appears well-developed.  HENT: oral mucosa pink and moist. Dentition fair  Head: Normocephalic.  Eyes: EOM are normal.  Neck: Normal range of motion. Neck supple. No thyromegaly present.  Cardiovascular: Normal rate and regular rhythm. No murmurs, rubs, or gallops  Respiratory: Effort normal and breath sounds normal. No respiratory distress. No wheezes, rales, or rhonchi  GI: Soft. Bowel sounds are normal. She exhibits no distension. Non-tender  Skin:  Hip incision clean and dry, staples intact Psychiatric: She has a normal mood and affect. Very pleasant  Neuro: A and O x 3.  motor strength bilateral upper extremities 5/5 in the , bicep, tricep, grip, shoulders limited by RTC/OA and are approximately 3+ to 4-  Right lower extremity 4/5 in the hip flexor knee extensor ankle dorsiflexor plantar flexor  1+/5 left hip flexor, - KE, 4/5 left ankle dorsiflexor plantar flexor.  Sensory intact to  light touch in the upper and lower limbs. Cognitively intact. CN exam normal   Assessment/Plan: 1. Functional deficits secondary to left femoral neck fracture after fall s/p left hip hemiarthroplasty which require 3+ hours per day of interdisciplinary therapy in a comprehensive inpatient rehab setting. Physiatrist is providing close team supervision and 24 hour management of active medical problems listed below. Physiatrist and rehab team continue to assess barriers to discharge/monitor patient progress toward functional and medical goals. FIM: FIM - Bathing Bathing Steps Patient Completed: Chest;Right Arm;Left Arm;Abdomen;Front perineal area;Buttocks;Right upper leg;Left upper leg Bathing: 4: Min-Patient completes 8-9 8255f 10 parts or 75+ percent  FIM - Upper Body Dressing/Undressing Upper body dressing/undressing steps patient completed: Thread/unthread right sleeve of pullover shirt/dresss;Thread/unthread left sleeve of pullover shirt/dress;Put head through opening of pull over shirt/dress;Pull shirt over trunk Upper body dressing/undressing: 5: Set-up assist to: Obtain clothing/put away FIM - Lower Body Dressing/Undressing Lower body dressing/undressing steps patient completed: Pull pants up/down Lower body dressing/undressing: 1: Total-Patient completed less than 25% of tasks  FIM - Toileting Toileting: 0: Activity did not occur  FIM - Diplomatic Services operational officerToilet Transfers Toilet Transfers Assistive Devices: Grab bars Toilet Transfers: 3-To toilet/BSC: Mod A (lift or lower assist);3-From toilet/BSC: Mod A (lift or lower assist)  FIM - BankerBed/Chair Transfer Bed/Chair Transfer Assistive Devices: Walker;Arm rests Bed/Chair Transfer: 5: Sit > Supine: Supervision (verbal cues/safety issues);4: Chair or W/C > Bed: Min A (steadying Pt. > 75%)  FIM -  Locomotion: Wheelchair Distance: 30 Locomotion: Wheelchair: 1: Total Assistance/staff pushes wheelchair (Pt<25%) FIM - Locomotion: Ambulation Locomotion:  Ambulation Assistive Devices: Designer, industrial/productWalker - Rolling Ambulation/Gait Assistance: 4: Min assist Locomotion: Ambulation: 0: Activity did not occur  Comprehension Comprehension Mode: Auditory Comprehension: 6-Follows complex conversation/direction: With extra time/assistive device  Expression Expression Mode: Verbal Expression: 6-Expresses complex ideas: With extra time/assistive device  Social Interaction Social Interaction: 7-Interacts appropriately with others - No medications needed.  Problem Solving Problem Solving: 6-Solves complex problems: With extra time  Memory Memory: 6-Assistive device: No helper  Medical Problem List and Plan:  1. Left femoral neck fracture after a fall. Status post left hip hemiarthroplasty 04/08/2014. Partial weightbearing with posterior hip precautions  2. DVT Prophylaxis/Anticoagulation: Subcutaneous Lovenox. Monitor platelet counts any signs of bleeding.   -dopplers negative 3. Pain Management: Oxycodone and Robaxin as needed with good control 4. Neuropsych: This patient is capable of making decisions on his own behalf.  5. Acute blood loss anemia. Followup CBC with improved hgb 6. Pseudomonas urinary tract infection. continue Cipro for 7 days 7. Diabetes mellitus with peripheral neuropathy. Hemoglobin A1c 6.8. Lantus insulin 15 units each bedtime. Check blood sugars a.c. and at bedtime   -fair control generally---no changes 8. Hypertension. Norvasc 10 mg daily, Tenormin 100 mg daily, lisinopril 40 mg daily. Monitor with increased mobility  9. Hypothyroidism. Synthroid. Latest TSH level 4.400  10. Hyperlipidemia. Lipitor/ Lopid    LOS (Days) 5 A FACE TO FACE EVALUATION WAS PERFORMED  Ranelle OysterZachary T Tayona Sarnowski 04/15/2014 8:09 AM

## 2014-04-15 NOTE — Progress Notes (Signed)
Occupational Therapy Session Note  Patient Details  Name: April [name] MRN: [id number] Date of Birth: 09/18/1925  Today's Date: 04/15/2014 Time: 0830-0930 Time Calculation (min): 60 min  Short Term Goals: Week 1:  OT Short Term Goal 1 (Week 1): Patient will recall 3/3 posterior hip precautions during ADL session. OT Short Term Goal 2 (Week 1): Patient will complete bathing with minimum assistance with use of adaptive equipment. OT Short Term Goal 3 (Week 1): Patient will complete upper body dressing with modified independence. OT Short Term Goal 4 (Week 1): Patient will complete toileting with moderate assistance. OT Short Term Goal 5 (Week 1): Patient will complete lower body dressing with minimum assistance with use of adaptive equipment.  Skilled Therapeutic Interventions/Progress Updates: ADL-retraining with focus on adapted bathing (shower level), education and training on use of AE/DME (grab bars, tub bench, BSC over toilet), and functional transfers from bed to w/c (squat pivot), w/c to toilet w/BSC as safety frame (SPT), and RW to tub bench (SPT).   Patient progressed through transfers and bathing with overall mod assist and required repeated instructional cues during transfers.   Patient demonstrated improved performance during repeated sit><stand but required hand guidance to locate arm rest and to release grasp from bed rail and grab bars during transfers.      Therapy Documentation Precautions:  Precautions Precautions: Fall;Posterior Hip Precaution Booklet Issued: Yes (comment) Precaution Comments: Reviewed precautions Required Braces or Orthoses: Knee Immobilizer - Left Restrictions Weight Bearing Restrictions: Yes LLE Weight Bearing: Partial weight bearing LLE Partial Weight Bearing Percentage or Pounds: 50%  Pain: Pain Assessment Pain Assessment: 0-10 Pain Score: 4  Pain Type: Acute pain Pain Location: Hip Pain Orientation: Left Pain Descriptors /  Indicators: Constant;Aching Pain Frequency: Intermittent Pain Onset: On-going Patients Stated Pain Goal: 1 Pain Intervention(s): Medication (See eMAR)  See FIM for current functional status  Therapy/Group: Individual Therapy  Second session: Time: 1130-1200 Time Calculation (min):  30 min  Pain Assessment: 4/10, left hip  Skilled Therapeutic Interventions: ADL-retraining with focus on use of AE to improve performance with lower body bathing and dressing.  Patient instructed on use of LH sponge, reacher, LH shoe horn, and sock aid.   Patient able to use all devices as instructed with 1:1 supervision (verbal instructional cues) and min assist to don left shoe d/t incoordination and limited ROM d/t hip precautions.   See FIM for current functional status  Therapy/Group: Individual Therapy  Donzetta KohutFrank Ikeem Cleckler 04/15/2014, 10:10 AM

## 2014-04-15 NOTE — Plan of Care (Signed)
Problem: RH BOWEL ELIMINATION Goal: RH STG MANAGE BOWEL W/MEDICATION W/ASSISTANCE STG Manage Bowel with Medication with min Assistance.  Outcome: Not Progressing LBM 4/14, on colace and senna scheduled, PRN miralax and sorbitol  Problem: RH BLADDER ELIMINATION Goal: RH STG MANAGE BLADDER WITH ASSISTANCE STG Manage Bladder With mod I Assistance  Outcome: Not Progressing Foley catheter, managed by staff

## 2014-04-15 NOTE — Progress Notes (Signed)
Physical Therapy Session Note  Patient Details  Name: April GoresValerie A Osentoski MRN: [id number] Date of Birth: 09/17/1925  Today's Date: 04/15/2014 Time: 0930-1030 Time Calculation (min): 60 min  Short Term Goals: Week 1:  PT Short Term Goal 1 (Week 1): Pt will perform supine <> sit with mod A PT Short Term Goal 2 (Week 1): Pt will perform transfers bed <> chair with min A PT Short Term Goal 3 (Week 1): Pt will perform gait in controlled environment x 25' with mod A  PT Short Term Goal 4 (Week 1): Pt will perform w/c mobility x 150' with min A PT Short Term Goal 5 (Week 1): Pt will negotiate 3 stairs with 2 rails and mod A  Skilled Therapeutic Interventions/Progress Updates:    Pt with complaints of increased lightheadedness this AM and increased pain to L hip - RN made aware and vitals taken (see below). Pt reports having a very large BM this morning and RN attributed symptoms to this. Pt limited during session due to the above mentioned complaints. Focused on functional transfers with RW (min A overall and cues for technique), bed mobility on flat surface (mod A for supine to sit and min A for sit to supine due to pain in LLE), and supine therex including heel slides, SAQ, and ankle pumps BLE (AAROM on LLE) x 10 reps each x 2 sets. Ice pack applied to L hip end of session for pain relief.   Therapy Documentation Precautions:  Precautions Precautions: Fall;Posterior Hip Restrictions Weight Bearing Restrictions: Yes LLE Weight Bearing: Partial weight bearing LLE Partial Weight Bearing Percentage or Pounds: 50%  Vital Signs:  BP 117/44 mmHg; HR = 62 bpm; O2 on room air = 98-100% Pain: C/o L hip pain - premedicated and RN aware. Ice pack applied at end of session.  See FIM for current functional status  Therapy/Group: Individual Therapy  Philip Aspenlison B Inocencio Roy 04/15/2014, 12:22 PM

## 2014-04-15 NOTE — Patient Care Conference (Signed)
Inpatient RehabilitationTeam Conference and Plan of Care Update Date: 04/14/2014   Time: 2:05 PM    Patient Name: April GoresValerie A Dearmas      Medical Record Number: [id number]  Date of Birth: 11/19/1925 Sex: Female         Room/Bed: 4W25C/4W25C-01 Payor Info: Payor: MEDICARE / Plan: MEDICARE PART A AND B / Product Type: *No Product type* /    Admitting Diagnosis: L FN Fx  Admit Date/Time:  04/10/2014  4:52 PM Admission Comments: No comment available   Primary Diagnosis:  <principal problem not specified> Principal Problem: <principal problem not specified>  Patient Active Problem List   Diagnosis Date Noted  . Hip fracture 04/10/2014  . Fracture of femoral neck, left 04/08/2014  . Left displaced femoral neck fracture 04/07/2014  . HTN (hypertension) 04/07/2014  . Hyperlipidemia 04/07/2014  . DM (diabetes mellitus) 04/07/2014  . Hypothyroidism 04/07/2014  . OA (osteoarthritis) 04/07/2014  . UTI (lower urinary tract infection) 04/07/2014  . Dehydration 04/07/2014    Expected Discharge Date: Expected Discharge Date: 04/23/14  Team Members Present: Physician leading conference: Dr. Faith RogueZachary Swartz Social Worker Present: Amada JupiterLucy Kris Burd, LCSW Nurse Present: Carlean PurlMaryann Barbour, RN PT Present: Karolee StampsAlison Gray, PT OT Present: Roney MansJennifer Smith, OT SLP Present: Feliberto Gottronourtney Payne, SLP PPS Coordinator present : Tora DuckMarie Noel, RN, CRRN;Becky Henrene DodgeWindsor, PT     Current Status/Progress Goal Weekly Team Focus  Medical   left hip fx, dm,htn, abla  stabilize medically for dc.   pain mgt,  voiding trial.    Bowel/Bladder   Foley in place, Lg.BM after 8 days  Discontinue foley, begin voiding trials, remain free of constipation  Up to bathroom to void and remain of constipation   Swallow/Nutrition/ Hydration             ADL's   Setup UB ADL, Total A LB ADL, Max A transfer bed>w/c  Overall Supervision for ADL  Transfers, AE instruction, safety and adherence to posterior hip precautions   Mobility    Supervision-Min bed mobility, Min-Mod A with functional transfers and short-distance gait, mod A with stairs  Mod I overall, with exception of Min A with stairs  Bed mobility, transfers, gait/stair training, activity tolerance, adherence to weightbearing restrictions, initiate family education   Communication             Safety/Cognition/ Behavioral Observations            Pain   10mg  Oxy IR q 4hr. PRN  >1 on a 0-10 scale  Assess pain q 2hr and after q medication intervention   Skin   Scattered bruising, Staples to Lf. hip with foam dressing in place, Stage 1 to sacrum with foam dressing in place  Remain free infection and breakdown  Assess skin q shift    Rehab Goals Patient on target to meet rehab goals: Yes *See Care Plan and progress notes for long and short-term goals.  Barriers to Discharge: pain mgt, urinary retention    Possible Resolutions to Barriers:  voiding trial, adaptive equipment and techniques.    Discharge Planning/Teaching Needs:  home with family providing intermittent assist      Team Discussion:  Doing very well.  Goals at mod i with PT.  Currently mod assist overall.  Hope to remove foley tomorrow if mobility allows.  Revisions to Treatment Plan:  None   Continued Need for Acute Rehabilitation Level of Care: The patient requires daily medical management by a physician with specialized training in physical medicine and rehabilitation for the  following conditions: Daily direction of a multidisciplinary physical rehabilitation program to ensure safe treatment while eliciting the highest outcome that is of practical value to the patient.: Yes Daily medical management of patient stability for increased activity during participation in an intensive rehabilitation regime.: Yes Daily analysis of laboratory values and/or radiology reports with any subsequent need for medication adjustment of medical intervention for : Other;Post surgical problems;Cardiac  problems  Amada JupiterLucy Paisly Fingerhut 04/15/2014, 1:54 PM

## 2014-04-16 ENCOUNTER — Inpatient Hospital Stay (HOSPITAL_COMMUNITY): Payer: Self-pay

## 2014-04-16 ENCOUNTER — Inpatient Hospital Stay (HOSPITAL_COMMUNITY): Payer: Worker's Compensation

## 2014-04-16 DIAGNOSIS — I10 Essential (primary) hypertension: Secondary | ICD-10-CM

## 2014-04-16 DIAGNOSIS — W19XXXA Unspecified fall, initial encounter: Secondary | ICD-10-CM

## 2014-04-16 DIAGNOSIS — E119 Type 2 diabetes mellitus without complications: Secondary | ICD-10-CM

## 2014-04-16 DIAGNOSIS — N390 Urinary tract infection, site not specified: Secondary | ICD-10-CM

## 2014-04-16 DIAGNOSIS — S72009A Fracture of unspecified part of neck of unspecified femur, initial encounter for closed fracture: Secondary | ICD-10-CM

## 2014-04-16 LAB — GLUCOSE, CAPILLARY
GLUCOSE-CAPILLARY: 102 mg/dL — AB (ref 70–99)
GLUCOSE-CAPILLARY: 83 mg/dL (ref 70–99)
Glucose-Capillary: 178 mg/dL — ABNORMAL HIGH (ref 70–99)
Glucose-Capillary: 124 mg/dL — ABNORMAL HIGH (ref 70–99)
Glucose-Capillary: 157 mg/dL — ABNORMAL HIGH (ref 70–99)

## 2014-04-16 NOTE — Progress Notes (Signed)
[facility] PHYSICAL MEDICINE & REHABILITATION     PROGRESS NOTE    Subjective/Complaints: No new issues. Feeling well. Normal bowel and bladder habits.  A 12 point review of systems has been performed and if not noted above is otherwise negative.    Objective: Vital Signs: Blood pressure 163/71, pulse 60, temperature 98.1 F (36.7 C), temperature source Oral, resp. rate 18, height 5\' 8"  (1.727 m), weight 82.8 kg (182 lb 8.7 oz), SpO2 95.00%. No results found. No results found for this basename: WBC, HGB, HCT, PLT,  in the last 72 hours No results found for this basename: NA, K, CL, CO, GLUCOSE, BUN, CREATININE, CALCIUM,  in the last 72 hours CBG (last 3)   Recent Labs  04/15/14 1622 04/15/14 2108 04/16/14 0733  GLUCAP 128* 178* 102*    Wt Readings from Last 3 Encounters:  04/10/14 82.8 kg (182 lb 8.7 oz)  04/07/14 81.647 kg (180 lb)  04/07/14 81.647 kg (180 lb)    Physical Exam:  Constitutional: She is oriented to person, place, and time. She appears well-developed.  HENT: oral mucosa pink and moist. Dentition fair  Head: Normocephalic.  Eyes: EOM are normal.  Neck: Normal range of motion. Neck supple. No thyromegaly present.  Cardiovascular: Normal rate and regular rhythm. No murmurs, rubs, or gallops  Respiratory: Effort normal and breath sounds normal. No respiratory distress. No wheezes, rales, or rhonchi  GI: Soft. Bowel sounds are normal. She exhibits no distension. Non-tender  Skin:  Hip incision clean and dry, staples intact Psychiatric: She has a normal mood and affect. Very pleasant  Neuro: A and O x 3.  motor strength bilateral upper extremities 5/5 in the , bicep, tricep, grip, shoulders limited by RTC/OA and are approximately 3+ to 4-  Right lower extremity 4/5 in the hip flexor knee extensor ankle dorsiflexor plantar flexor  2-/5 left hip flexor, - KE, 4/5 left ankle dorsiflexor plantar flexor.   Sensory intact to light touch in the upper and lower  limbs. Cognitively intact. CN exam normal   Assessment/Plan: 1. Functional deficits secondary to left femoral neck fracture after fall s/p left hip hemiarthroplasty which require 3+ hours per day of interdisciplinary therapy in a comprehensive inpatient rehab setting. Physiatrist is providing close team supervision and 24 hour management of active medical problems listed below. Physiatrist and rehab team continue to assess barriers to discharge/monitor patient progress toward functional and medical goals. FIM: FIM - Bathing Bathing Steps Patient Completed: Chest;Right Arm;Left Arm;Abdomen;Front perineal area;Buttocks;Right upper leg;Left upper leg Bathing: 4: Min-Patient completes 8-9 1621f 10 parts or 75+ percent  FIM - Upper Body Dressing/Undressing Upper body dressing/undressing steps patient completed: Thread/unthread right sleeve of pullover shirt/dresss;Thread/unthread left sleeve of pullover shirt/dress;Put head through opening of pull over shirt/dress;Pull shirt over trunk Upper body dressing/undressing: 5: Set-up assist to: Obtain clothing/put away FIM - Lower Body Dressing/Undressing Lower body dressing/undressing steps patient completed: Pull pants up/down Lower body dressing/undressing: 1: Total-Patient completed less than 25% of tasks  FIM - Toileting Toileting: 0: Activity did not occur  FIM - Diplomatic Services operational officerToilet Transfers Toilet Transfers Assistive Devices: Grab bars Toilet Transfers: 3-To toilet/BSC: Mod A (lift or lower assist);3-From toilet/BSC: Mod A (lift or lower assist)  FIM - BankerBed/Chair Transfer Bed/Chair Transfer Assistive Devices: Walker;Arm rests Bed/Chair Transfer: 4: Bed > Chair or W/C: Min A (steadying Pt. > 75%);4: Chair or W/C > Bed: Min A (steadying Pt. > 75%);4: Supine > Sit: Min A (steadying Pt. > 75%/lift 1 leg);3: Sit > Supine:  Mod A (lifting assist/Pt. 50-74%/lift 2 legs)  FIM - Locomotion: Wheelchair Distance: 150 Locomotion: Wheelchair: 5: Travels 150 ft or more:  maneuvers on rugs and over door sills with supervision, cueing or coaxing FIM - Locomotion: Ambulation Locomotion: Ambulation Assistive Devices: Designer, industrial/productWalker - Rolling Ambulation/Gait Assistance: 4: Min assist Locomotion: Ambulation: 1: Travels less than 50 ft with minimal assistance (Pt.>75%)  Comprehension Comprehension Mode: Auditory Comprehension: 7-Follows complex conversation/direction: With no assist  Expression Expression Mode: Verbal Expression: 7-Expresses complex ideas: With no assist  Social Interaction Social Interaction: 7-Interacts appropriately with others - No medications needed.  Problem Solving Problem Solving: 7-Solves complex problems: Recognizes & self-corrects  Memory Memory: 7-Complete Independence: No helper  Medical Problem List and Plan:  1. Left femoral neck fracture after a fall. Status post left hip hemiarthroplasty 04/08/2014. Partial weightbearing with posterior hip precautions  2. DVT Prophylaxis/Anticoagulation: Subcutaneous Lovenox. Monitor platelet counts any signs of bleeding.   -dopplers negative 3. Pain Management: Oxycodone and Robaxin as needed with good control 4. Neuropsych: This patient is capable of making decisions on his own behalf.  5. Acute blood loss anemia. Followup CBC with improved hgb 6. Pseudomonas urinary tract infection. continue Cipro for 7 days 7. Diabetes mellitus with peripheral neuropathy. Hemoglobin A1c 6.8. Lantus insulin 15 units each bedtime. Check blood sugars a.c. and at bedtime   -fair control generally---no changes 8. Hypertension. Norvasc 10 mg daily, Tenormin 100 mg daily, lisinopril 40 mg daily. Monitor with increased mobility  9. Hypothyroidism. Synthroid. Latest TSH level 4.400  10. Hyperlipidemia. Lipitor/ Lopid    LOS (Days) 6 A FACE TO FACE EVALUATION WAS PERFORMED  Ranelle OysterZachary T Swartz 04/16/2014 8:17 AM

## 2014-04-16 NOTE — Progress Notes (Signed)
Physical Therapy Session Note  Patient Details  Name: April [name] MRN: [id number] Date of Birth: 07/25/1925  Today's Date: 04/16/2014 Time: 1425-1500 Time Calculation (min): 35 min  Short Term Goals: Week 1:  PT Short Term Goal 1 (Week 1): Pt will perform supine <> sit with mod A PT Short Term Goal 2 (Week 1): Pt will perform transfers bed <> chair with min A PT Short Term Goal 3 (Week 1): Pt will perform gait in controlled environment x 25' with mod A  PT Short Term Goal 4 (Week 1): Pt will perform w/c mobility x 150' with min A PT Short Term Goal 5 (Week 1): Pt will negotiate 3 stairs with 2 rails and mod A  Skilled Therapeutic Interventions/Progress Updates:   Missed 10 min of skilled PT at beginning of session due to visiting with friends. Focused on functional transfers with RW, bed mobility using leg lifter (required min A this PM due to pain/fatigue), simulated car transfer with min A (educated on proper technique to maintain hip precautions), and w/c propulsion for UE strengthening and endurance. Pt continues to be limited by endurance and pain to LLE.  Therapy Documentation Precautions:  Precautions Precautions: Fall;Posterior Hip Precaution Booklet Issued: Yes (comment) Precaution Comments: Reviewed precautions Required Braces or Orthoses:  ( ) Restrictions Weight Bearing Restrictions: Yes LLE Weight Bearing: Partial weight bearing LLE Partial Weight Bearing Percentage or Pounds:  (50%)  Pain: Still reports L hip pain - premedicated.   See FIM for current functional status  Therapy/Group: Individual Therapy  [name] 04/16/2014, 3:56 PM

## 2014-04-16 NOTE — Progress Notes (Signed)
Occupational Therapy Session Note  Patient Details  Name: April GoresValerie A Zimny MRN: [id number] Date of Birth: 10/13/1925  Today's Date: 04/16/2014 Time: 0730-0828 Time Calculation (min): 58 min  Short Term Goals: Week 1:  OT Short Term Goal 1 (Week 1): Patient will recall 3/3 posterior hip precautions during ADL session. OT Short Term Goal 2 (Week 1): Patient will complete bathing with minimum assistance with use of adaptive equipment. OT Short Term Goal 3 (Week 1): Patient will complete upper body dressing with modified independence. OT Short Term Goal 4 (Week 1): Patient will complete toileting with moderate assistance. OT Short Term Goal 5 (Week 1): Patient will complete lower body dressing with minimum assistance with use of adaptive equipment.  Skilled Therapeutic Interventions/Progress Updates: ADL-retraining with focus on improved endurance, functional mobility using RW, continued AE training to improve lower body dressing skills.    Patient received supine in bed, receptive to dressing using standard clothing this session as advised.   Patient completed bathing and dressing at shower level with steadying assist during transfers, min guard during functional mobility, and supervision during bathing to provide instruction cues and safety prompts due to mild confusion with sequencing to maintain adherence to hip precautions.    Patient required min assist to use reacher to lace left pant leg and she was unable to don shoe with shoe horn despite repeated instruction and hand guidance this session.   Patient left with non-skid socks on and setup for self-feeding with call light and phone within reach.    Therapy Documentation Precautions:  Precautions Precautions: Fall;Posterior Hip Precaution Booklet Issued: Yes (comment) Precaution Comments: Reviewed precautions Required Braces or Orthoses:  ( ) Restrictions Weight Bearing Restrictions: Yes LLE Weight Bearing: Partial weight bearing LLE  Partial Weight Bearing Percentage or Pounds:  (50%)  Vital Signs: Therapy Vitals Temp: 98.1 F (36.7 C) Temp src: Oral Pulse Rate: 60 Resp: 18 BP: 163/71 mmHg Patient Position, if appropriate: Lying Oxygen Therapy SpO2: 95 % O2 Device: None (Room air)  Pain: Pain Assessment Pain Assessment: No/denies pain Pain Score: 4  Pain Type: Acute pain Pain Location: Hip Pain Orientation: Left Pain Frequency: Intermittent Pain Onset: With Activity Patients Stated Pain Goal: 1 Pain Intervention(s): Medication (See eMAR)  See FIM for current functional status  Therapy/Group: Individual Therapy  Second session: Time: 1345-1415 Time Calculation (min):  30 min  Pain Assessment: 7/10, left hip  Skilled Therapeutic Interventions: ADL-retraining with focus on family ed with daughter Jasmine DecemberSharon present, AE training using long shoe horn and sock aid, toileting, and dynamic standing balance.   Patient practiced lower body dressing skills and with extra time, hand guidance and repeated instruction, donned left shoe using shoe horn and observing posterior hip precautions.   After AE training patient reported need for toileting and was escorted to bathroom using RW and min guard assist.   Patient transferred to toilet with supervision, voided BM, completed hygiene and required only steadying assist to pull up her pants.   Patient returned to w/c for grooming at sink as physical therapist arrived.  See FIM for current functional status  Therapy/Group: Individual Therapy  Donzetta KohutFrank Helaina Stefano 04/16/2014, 8:30 AM

## 2014-04-16 NOTE — Progress Notes (Signed)
Physical Therapy Session Note  Patient Details  Name: April [name] MRN: [id number] Date of Birth: 10/04/1925  Today's Date: 04/16/2014 Time: 0930-1028 Time Calculation (min): 58 min  Short Term Goals: Week 1:  PT Short Term Goal 1 (Week 1): Pt will perform supine <> sit with mod A PT Short Term Goal 2 (Week 1): Pt will perform transfers bed <> chair with min A PT Short Term Goal 3 (Week 1): Pt will perform gait in controlled environment x 25' with mod A  PT Short Term Goal 4 (Week 1): Pt will perform w/c mobility x 150' with min A PT Short Term Goal 5 (Week 1): Pt will negotiate 3 stairs with 2 rails and mod A  Skilled Therapeutic Interventions/Progress Updates:    Pt able to recall 2/3 posterior hip precautions. Session focused on w/c propulsion for endurance and strengthening, gait training x 25' x 2 trials with min A (slow cadence, cues for efficiency of gait and maintaining body close to RW), discussion in regards to d/c planning (recommending ramp for home entry as pt reports 2 steps and then 1 step that is about 11" tall and with a PWB status, this would be very difficult for pt to do safely), bed mobility with use of leg lifter, transfers (min A and cues for maintaining precautions) with RW, and supine LE therex including heel slides, ankle pumps, and hip abduction (15 reps BLE with AAROM for LLE). Pt making good progress overall. Will continue to monitor, but may downgrade goals to S due to pt requiring cues for maintaining hip precautions during transitional movements.   Therapy Documentation Precautions:  Precautions Precautions: Fall;Posterior Hip Precaution Booklet Issued: Yes (comment) Precaution Comments: Reviewed precautions Required Braces or Orthoses:  ( ) Restrictions Weight Bearing Restrictions: Yes LLE Weight Bearing: Partial weight bearing LLE Partial Weight Bearing Percentage or Pounds:  (50%)  Pain: C/o pain in L hip - premedicated. Rest breaks as  needed.  See FIM for current functional status  Therapy/Group: Individual Therapy  Philip Aspenlison B [name] 04/16/2014, 10:29 AM

## 2014-04-17 ENCOUNTER — Inpatient Hospital Stay (HOSPITAL_COMMUNITY): Payer: Worker's Compensation

## 2014-04-17 ENCOUNTER — Inpatient Hospital Stay (HOSPITAL_COMMUNITY): Payer: Worker's Compensation | Admitting: *Deleted

## 2014-04-17 ENCOUNTER — Inpatient Hospital Stay (HOSPITAL_COMMUNITY): Payer: Self-pay

## 2014-04-17 DIAGNOSIS — E119 Type 2 diabetes mellitus without complications: Secondary | ICD-10-CM

## 2014-04-17 DIAGNOSIS — I10 Essential (primary) hypertension: Secondary | ICD-10-CM

## 2014-04-17 DIAGNOSIS — W19XXXA Unspecified fall, initial encounter: Secondary | ICD-10-CM

## 2014-04-17 DIAGNOSIS — S72009A Fracture of unspecified part of neck of unspecified femur, initial encounter for closed fracture: Secondary | ICD-10-CM

## 2014-04-17 DIAGNOSIS — N390 Urinary tract infection, site not specified: Secondary | ICD-10-CM

## 2014-04-17 LAB — GLUCOSE, CAPILLARY
Glucose-Capillary: 97 mg/dL (ref 70–99)
Glucose-Capillary: 143 mg/dL — ABNORMAL HIGH (ref 70–99)
Glucose-Capillary: 104 mg/dL — ABNORMAL HIGH (ref 70–99)
Glucose-Capillary: 132 mg/dL — ABNORMAL HIGH (ref 70–99)

## 2014-04-17 LAB — CREATININE, SERUM
CREATININE: 1.17 mg/dL — AB (ref 0.50–1.10)
GFR calc Af Amer: 47 mL/min — ABNORMAL LOW (ref >90)
GFR calc non Af Amer: 41 mL/min — ABNORMAL LOW (ref >90)

## 2014-04-17 NOTE — Progress Notes (Signed)
Physical Therapy Weekly Progress Note  Patient Details  Name: April [name] MRN: [id number] Date of Birth: 1925/01/13  Beginning of progress report period: April 11, 2014 End of progress report period: April 17, 2014  Today's Date: 04/17/2014  Patient has met 5 of 5 short term goals. Pt is making good progress while on CIR. Pain and decreased endurance are main limiting factors. Pt continues to require cueing for functional adherence to hip precautions and WB restriction. Recommending ramp for home entry due to WB status and very high steps into the home. Goals are currently at mod I, but will continue to monitor. May need to be downgraded to S for cueing to maintain hip precautions with mobility activities. Pt is overall min A with gait and transfers with RW at this time.  Patient continues to demonstrate the following deficits: impaired balance, decreased strength, decreased endurance, pain, edema, decreased ROM, WB restrictions, and therefore will continue to benefit from skilled PT intervention to enhance overall performance with activity tolerance, balance, ability to compensate for deficits, functional use of  left lower extremity and knowledge of precautions.  Patient progressing toward long term goals..  Continue plan of care.  PT Short Term Goals Week 1:  PT Short Term Goal 1 (Week 1): Pt will perform supine <> sit with mod A PT Short Term Goal 1 - Progress (Week 1): Met PT Short Term Goal 2 (Week 1): Pt will perform transfers bed <> chair with min A PT Short Term Goal 2 - Progress (Week 1): Met PT Short Term Goal 3 (Week 1): Pt will perform gait in controlled environment x 25' with mod A  PT Short Term Goal 3 - Progress (Week 1): Met PT Short Term Goal 4 (Week 1): Pt will perform w/c mobility x 150' with min A PT Short Term Goal 4 - Progress (Week 1): Met PT Short Term Goal 5 (Week 1): Pt will negotiate 3 stairs with 2 rails and mod A PT Short Term Goal 5 - Progress (Week 1):  Met Week 2:  PT Short Term Goal 1 (Week 2): = LTGs  Skilled Therapeutic Interventions/Progress Updates:  Ambulation/gait training;Balance/vestibular training;DME/adaptive equipment instruction;Functional mobility training;Neuromuscular re-education;Pain management;Patient/family education;Stair training;Therapeutic Activities;Therapeutic Exercise;UE/LE Strength taining/ROM;Discharge planning;Disease management/prevention;Community reintegration;Skin care/wound management;Psychosocial support;UE/LE Coordination activities;Wheelchair propulsion/positioning   Therapy Documentation Precautions:  Precautions Precautions: Fall;Posterior Hip Precaution Booklet Issued: Yes (comment) Precaution Comments: Reviewed precautions Required Braces or Orthoses:  ( ) Restrictions Weight Bearing Restrictions: Yes LLE Weight Bearing: Partial weight bearing LLE Partial Weight Bearing Percentage or Pounds:  (50%)  See FIM for current functional status    Lars Masson 04/17/2014, 4:42 PM

## 2014-04-17 NOTE — Progress Notes (Signed)
Physical Therapy Session Note  Patient Details  Name: April [name] MRN: [id number] Date of Birth: 04/08/1925  Today's Date: 04/17/2014 Time: [id number] Time Calculation (min): 55 min  Short Term Goals: Week 1:  PT Short Term Goal 1 (Week 1): Pt will perform supine <> sit with mod A PT Short Term Goal 2 (Week 1): Pt will perform transfers bed <> chair with min A PT Short Term Goal 3 (Week 1): Pt will perform gait in controlled environment x 25' with mod A  PT Short Term Goal 4 (Week 1): Pt will perform w/c mobility x 150' with min A PT Short Term Goal 5 (Week 1): Pt will negotiate 3 stairs with 2 rails and mod A  Skilled Therapeutic Interventions/Progress Updates:    Session focused on w/c propulsion down to therapy gym for functional UE strength and endurance with 2 rest breaks, dynamic gait through obstacle course to simulate household environment navigating cones and stepping over objects (cues for technique) with close S using RW, dynamic standing balance activity with 1 UE support to reach outside BOS for functional task placing horseshoes on basketball hoop, and gait training with RW for endurance and strengthening to increase functional independence x 20' and x 25' with close S. Pt left in gym for next session with OT.  Therapy Documentation Precautions:  Precautions Precautions: Fall;Posterior Hip Precaution Booklet Issued: Yes (comment) Precaution Comments: Reviewed precautions Required Braces or Orthoses:  ( ) Restrictions Weight Bearing Restrictions: Yes LLE Weight Bearing: Partial weight bearing LLE Partial Weight Bearing Percentage or Pounds:  (50%)   Pain: C/o pain in L hip - RN notified for pain medication.  See FIM for current functional status  Therapy/Group: Individual Therapy  Philip Aspenlison B Jaelin Fackler 04/17/2014, 3:01 PM

## 2014-04-17 NOTE — Progress Notes (Signed)
[facility]     PROGRESS NOTE    Subjective/Complaints: Pleased with progress. Slept well. No cough, cp, sob.  A 12 point review of systems has been performed and if not noted above is otherwise negative.    Objective: Vital Signs: Blood pressure 136/61, pulse 62, temperature 97.5 F (36.4 C), temperature source Oral, resp. rate 18, height 5\' 8"  (1.727 m), weight 82.8 kg (182 lb 8.7 oz), SpO2 92.00%. No results found. No results found for this basename: WBC, HGB, HCT, PLT,  in the last 72 hours  Recent Labs  04/17/14 0612  CREATININE 1.17*   CBG (last 3)   Recent Labs  04/16/14 1634 04/16/14 2045 04/17/14 0719  GLUCAP 83 157* 97    Wt Readings from Last 3 Encounters:  04/10/14 82.8 kg (182 lb 8.7 oz)  04/07/14 81.647 kg (180 lb)  04/07/14 81.647 kg (180 lb)    Physical Exam:  Constitutional: She is oriented to person, place, and time. She appears well-developed.  HENT: oral mucosa pink and moist. Dentition fair  Head: Normocephalic.  Eyes: EOM are normal.  Neck: Normal range of motion. Neck supple. No thyromegaly present.  Cardiovascular: Normal rate and regular rhythm. No murmurs, rubs, or gallops  Respiratory: Effort normal and breath sounds normal. No respiratory distress. No wheezes, rales, or rhonchi  GI: Soft. Bowel sounds are normal. She exhibits no distension. Non-tender  Skin:  Hip incision clean and dry, staples intact Psychiatric: She has a normal mood and affect. Very pleasant  Neuro: A and O x 3.  motor strength bilateral upper extremities 5/5 in the , bicep, tricep, grip, shoulders limited by RTC/OA and are approximately 3+ to 4-  Right lower extremity 4/5 in the hip flexor knee extensor ankle dorsiflexor plantar flexor  2-/5 left hip flexor, - KE, 4/5 left ankle dorsiflexor plantar flexor.   Sensory intact to light touch in the upper and lower limbs. Cognitively intact. CN exam normal   Assessment/Plan: 1.  Functional deficits secondary to left femoral neck fracture after fall s/p left hip hemiarthroplasty which require 3+ hours per day of interdisciplinary therapy in a comprehensive inpatient rehab setting. Physiatrist is providing close team supervision and 24 hour management of active medical problems listed below. Physiatrist and rehab team continue to assess barriers to discharge/monitor patient progress toward functional and medical goals. FIM: FIM - Bathing Bathing Steps Patient Completed: Chest;Right Arm;Left Arm;Abdomen;Front perineal area;Buttocks;Right upper leg;Left upper leg;Right lower leg (including foot);Left lower leg (including foot) Bathing: 6: Assistive device (Comment) (LH sponge)  FIM - Upper Body Dressing/Undressing Upper body dressing/undressing steps patient completed: Thread/unthread right sleeve of pullover shirt/dresss;Thread/unthread left sleeve of pullover shirt/dress;Put head through opening of pull over shirt/dress;Pull shirt over trunk Upper body dressing/undressing: 5: Set-up assist to: Obtain clothing/put away FIM - Lower Body Dressing/Undressing Lower body dressing/undressing steps patient completed: Thread/unthread right pants leg;Thread/unthread left pants leg;Pull pants up/down;Don/Doff right sock;Don/Doff left sock Lower body dressing/undressing: 4: Min-Patient completed 75 plus % of tasks  FIM - Toileting Toileting: 0: Activity did not occur  FIM - Diplomatic Services operational officerToilet Transfers Toilet Transfers Assistive Devices: Grab bars Toilet Transfers: 3-To toilet/BSC: Mod A (lift or lower assist);3-From toilet/BSC: Mod A (lift or lower assist)  FIM - Bed/Chair Transfer Bed/Chair Transfer Assistive Devices: Walker (leg lifter) Bed/Chair Transfer: 5: Sit > Supine: Supervision (verbal cues/safety issues);4: Chair or W/C > Bed: Min A (steadying Pt. > 75%)  FIM - Locomotion: Wheelchair Distance: 150 Locomotion: Wheelchair: 5: Travels 150 ft  or more: maneuvers on rugs and over  door sills with supervision, cueing or coaxing FIM - Locomotion: Ambulation Locomotion: Ambulation Assistive Devices: Walker - Rolling Ambulation/Gait Assistance: 4: Min assist Locomotion: Ambulation: 1: Travels less than 50 ft with minimal assistance (Pt.>75%)  Comprehension Comprehension Mode: Auditory Comprehension: 7-Follows complex conversation/direction: With no assist  Expression Expression Mode: Verbal Expression: 7-Expresses complex ideas: With no assist  Social Interaction Social Interaction: 7-Interacts appropriately with others - No medications needed.  Problem Solving Problem Solving: 7-Solves complex problems: Recognizes & self-corrects  Memory Memory: 7-Complete Independence: No helper  Medical Problem List and Plan:  1. Left femoral neck fracture after a fall. Status post left hip hemiarthroplasty 04/08/2014. Partial weightbearing with posterior hip precautions  2. DVT Prophylaxis/Anticoagulation: Subcutaneous Lovenox. Monitor platelet counts any signs of bleeding.   -dopplers negative 3. Pain Management: Oxycodone and Robaxin as needed with good control 4. Neuropsych: This patient is capable of making decisions on his own behalf.  5. Acute blood loss anemia. Followup CBC with improved hgb 6. Pseudomonas urinary tract infection. continue Cipro for 7 days---voiding better 7. Diabetes mellitus with peripheral neuropathy. Hemoglobin A1c 6.8. Lantus insulin 15 units each bedtime. Check blood sugars a.c. and at bedtime   -fair control generally---no changes 8. Hypertension. Norvasc 10 mg daily, Tenormin 100 mg daily, lisinopril 40 mg daily. Monitor with increased mobility  9. Hypothyroidism. Synthroid. Latest TSH level 4.400  10. Hyperlipidemia. Lipitor/ Lopid    LOS (Days) 7 [name] 04/17/2014 8:02 AM

## 2014-04-17 NOTE — Progress Notes (Signed)
Occupational Therapy Session Note  Patient Details  Name: April [name] MRN: [id number] Date of Birth: 08/04/1925  Today's Date: 04/17/2014 Time: [id number]  Time Calculation (min):45 min  Short Term Goals: Week 1:  OT Short Term Goal 1 (Week 1): Patient will recall 3/3 posterior hip precautions during ADL session. OT Short Term Goal 2 (Week 1): Patient will complete bathing with minimum assistance with use of adaptive equipment. OT Short Term Goal 3 (Week 1): Patient will complete upper body dressing with modified independence. OT Short Term Goal 4 (Week 1): Patient will complete toileting with moderate assistance. OT Short Term Goal 5 (Week 1): Patient will complete lower body dressing with minimum assistance with use of adaptive equipment.  Skilled Therapeutic Interventions/Progress Updates:    Pt. Sitting in wc upon OT arrival.   Ambulated to bathroom with RW to bathroom and transferred to 3n1 over toilet.  Required minimal cues throughout session for not turning LLE inward.  Pt had medium BM.  Ambulated out to wc and Utilized BUE to engage in folding blanket.  Pt. Transferred to bed with min assist.  Left in bed with call bell and phone by side.    Therapy Documentation Precautions:  Precautions Precautions: Fall;Posterior Hip Precaution Booklet Issued: Yes (comment) Precaution Comments: Reviewed precautions Required Braces or Orthoses:  ( ) Restrictions Weight Bearing Restrictions: Yes LLE Weight Bearing: Partial weight bearing LLE Partial Weight Bearing Percentage or Pounds:  (50%)      Pain: Pain Assessment Pain Assessment: No/denies pain Pain Score:6/10   At beginning of session;  5/10 at end resting in bed.          See FIM for current functional status  Therapy/Group: Individual Therapy  Humberto Sealslizabeth J Davante Gerke 04/17/2014, 10:20 AM

## 2014-04-17 NOTE — Progress Notes (Signed)
Occupational Therapy Session Note  Patient Details  Name: April [name] MRN: [id number] Date of Birth: 07/31/1925  Today's Date: 04/17/2014 Time: 0730-0830 60 minutes  Short Term Goals: Week 1:  OT Short Term Goal 1 (Week 1): Patient will recall 3/3 posterior hip precautions during ADL session. OT Short Term Goal 2 (Week 1): Patient will complete bathing with minimum assistance with use of adaptive equipment. OT Short Term Goal 3 (Week 1): Patient will complete upper body dressing with modified independence. OT Short Term Goal 4 (Week 1): Patient will complete toileting with moderate assistance. OT Short Term Goal 5 (Week 1): Patient will complete lower body dressing with minimum assistance with use of adaptive equipment.  Skilled Therapeutic Interventions/Progress Updates: ADL-retraining with focus on bathtub transfer using tub bench, adapted bathing and dressing, dynamic standing balance, and functional mobility using RW.   Patient completed bed mobility and transfer using only bedrail to assist and RW to approach w/c, with min verbal cues for safety and instructional re-ed on performance of sit><stand.   Patient receptive for bathing using standard tub and tub bench as planned for discharge.   Patient was escorted to ADL apartment and performed transfer on/off tub bench with supervision for safety and moderate questioning cues for technique to maintain hip precautions.   Patient completed bathing task efficiently but lacked AE to attend to dressing and required max assist to complete dressing.  OT reminded patient to request AE from daughter who alleges equipment is in her car.     Therapy Documentation Precautions:  Precautions Precautions: Fall;Posterior Hip Precaution Booklet Issued: Yes (comment) Precaution Comments: Reviewed precautions Required Braces or Orthoses:  ( ) Restrictions Weight Bearing Restrictions: Yes LLE Weight Bearing: Partial weight bearing LLE Partial Weight  Bearing Percentage or Pounds:  (50%)  Vital Signs: Therapy Vitals Temp: 97.5 F (36.4 C) Temp src: Oral Pulse Rate: 62 Resp: 18 BP: 136/61 mmHg Patient Position, if appropriate: Lying Oxygen Therapy SpO2: 92 %  Pain: No/denies pain    See FIM for current functional status  Therapy/Group: Individual Therapy  Second session: Time: 1430-1500 Time Calculation (min):  30 min  Pain Assessment: No/denies pain  Skilled Therapeutic Interventions: ADL-retraining with focus on re-ed on use of AE (patient's AE received from her daughter) to include flexible terrycloth sock aid, long shoe horn, reacher, dressing stick, and long sponge.   Patient sat at raised mat in gym and removed her socks, donned socks, and donned shoes using AE with min verbal cues for technique.   From gym patient ambulated 20 feet to bathroom and toileted, completing all task with only steadying assist required while standing to pull up her pants.   Patient then practiced transfer in/out of standard tub using RW and tub bench without need for cues or assist.   Patient was escorted back to her room in her w/c awaiting RN care for bladder scan with call light and phone placed within reach.   See FIM for current functional status  Therapy/Group: Individual Therapy  Donzetta KohutFrank Tamella Tuccillo 04/17/2014, 7:14 AM

## 2014-04-18 ENCOUNTER — Encounter (HOSPITAL_COMMUNITY): Payer: Self-pay

## 2014-04-18 LAB — GLUCOSE, CAPILLARY
GLUCOSE-CAPILLARY: 122 mg/dL — AB (ref 70–99)
Glucose-Capillary: 149 mg/dL — ABNORMAL HIGH (ref 70–99)
Glucose-Capillary: 105 mg/dL — ABNORMAL HIGH (ref 70–99)
Glucose-Capillary: 142 mg/dL — ABNORMAL HIGH (ref 70–99)

## 2014-04-18 NOTE — Progress Notes (Signed)
April [name] is a 80 y.o. female 03/01/1925 [id number]  Subjective: No new complaints. No new problems. Slept fair, not great. Feeling OK.  Objective: Vital signs in last 24 hours: Temp:  [97.9 F (36.6 C)-98.2 F (36.8 C)] 98.2 F (36.8 C) (04/25 0607) Pulse Rate:  [61-73] 61 (04/25 0607) Resp:  [17-18] 18 (04/25 0607) BP: (124-142)/(64-73) 129/64 mmHg (04/25 0607) SpO2:  [93 %-95 %] 95 % (04/25 0607) Weight change:  Last BM Date: 04/17/14  Intake/Output from previous day: 04/24 0701 - 04/25 0700 In: 480 [P.O.:480] Out: 450 [Urine:450] Last cbgs: CBG (last 3)   Recent Labs  04/17/14 1643 04/17/14 2040 04/18/14 0645  GLUCAP 104* 132* 122*     Physical Exam General: No apparent distress   HEENT: not dry Lungs: Normal effort. Lungs clear to auscultation, no crackles or wheezes. Cardiovascular: Regular rate and rhythm, no edema Abdomen: S/NT/ND; BS(+) Musculoskeletal:  unchanged Neurological: No new neurological deficits Wounds: clean   Skin: clear  Aging changes Mental state: Alert, oriented, cooperative    Lab Results: BMET    Component Value Date/Time   NA 142 04/13/2014 0530   K 4.2 04/13/2014 0530   CL 106 04/13/2014 0530   CO2 23 04/13/2014 0530   GLUCOSE 150* 04/13/2014 0530   BUN 23 04/13/2014 0530   CREATININE 1.17* 04/17/2014 0612   CALCIUM 9.1 04/13/2014 0530   GFRNONAA 41* 04/17/2014 0612   GFRAA 47* 04/17/2014 0612   CBC    Component Value Date/Time   WBC 7.6 04/13/2014 0530   RBC 3.17* 04/13/2014 0530   HGB 10.4* 04/13/2014 0530   HCT 30.7* 04/13/2014 0530   PLT 211 04/13/2014 0530   MCV 96.8 04/13/2014 0530   MCH 32.8 04/13/2014 0530   MCHC 33.9 04/13/2014 0530   RDW 14.1 04/13/2014 0530   LYMPHSABS 1.6 04/13/2014 0530   MONOABS 0.5 04/13/2014 0530   EOSABS 0.2 04/13/2014 0530   BASOSABS 0.0 04/13/2014 0530    Studies/Results: No results found.  Medications: I have reviewed the patient's current medications.  Assessment/Plan:  1. Left  femoral neck fracture after a fall. Status post left hip hemiarthroplasty 04/08/2014. Partial weightbearing with posterior hip precautions  2. DVT Prophylaxis/Anticoagulation: Subcutaneous Lovenox. Monitor platelet counts any signs of bleeding.  -dopplers negative  3. Pain Management: Oxycodone and Robaxin as needed with good control  4. Neuropsych: This patient is capable of making decisions on his own behalf.  5. Acute blood loss anemia. Followup CBC with improved hgb  6. Pseudomonas urinary tract infection. continue Cipro for 7 days---voiding better  7. Diabetes mellitus with peripheral neuropathy. Hemoglobin A1c 6.8. Lantus insulin 15 units each bedtime. Check blood sugars a.c. and at bedtime  -fair control generally---no changes  8. Hypertension. Norvasc 10 mg daily, Tenormin 100 mg daily, lisinopril 40 mg daily. Monitor with increased mobility  9. Hypothyroidism. Synthroid. Latest TSH level 4.400  10. Hyperlipidemia. Lipitor/ Lopid  Cont Rx     Length of stay, days: 8  Tresa GarterAleksei V Plotnikov , MD 04/18/2014, 9:43 AM

## 2014-04-18 NOTE — Progress Notes (Signed)
Physical Therapy Session Note  Patient Details  Name: Nelida GoresValerie A Heidelberg MRN: [id number] Date of Birth: 08/12/1925  Today's Date: 04/18/2014 Time: [id number] Time Calculation (min): 60 min   Skilled Therapeutic Interventions/Progress Updates:    Session focused on gait training with RW for functional mobility training, supine and seated LE therex for functional strengthening (2# ankle weight on RLE), and dynamic standing balance activity to increase balance and standing tolerance to play bean bag toss game. Pt completed 2 sets of 10 reps each BLE for supine hip abduction, SAQ, heel slides, and seated LAQ with rest breaks as needed. Dynamic standing balance activity with overall S.  Therapy Documentation Precautions:  Precautions Precautions: Fall;Posterior Hip Precaution Booklet Issued: Yes (comment) Precaution Comments: Reviewed precautions Required Braces or Orthoses:  ( ) Restrictions Weight Bearing Restrictions: Yes LLE Weight Bearing: Partial weight bearing LLE Partial Weight Bearing Percentage or Pounds: 50  Pain: Premedicated for pain in L hip.  See FIM for current functional status  Therapy/Group: Individual Therapy  Philip Aspenlison B Ervey Fallin 04/18/2014, 12:03 PM

## 2014-04-19 ENCOUNTER — Inpatient Hospital Stay (HOSPITAL_COMMUNITY): Payer: Worker's Compensation | Admitting: *Deleted

## 2014-04-19 DIAGNOSIS — M1990 Unspecified osteoarthritis, unspecified site: Secondary | ICD-10-CM

## 2014-04-19 DIAGNOSIS — S72009A Fracture of unspecified part of neck of unspecified femur, initial encounter for closed fracture: Secondary | ICD-10-CM

## 2014-04-19 DIAGNOSIS — E039 Hypothyroidism, unspecified: Secondary | ICD-10-CM

## 2014-04-19 DIAGNOSIS — E119 Type 2 diabetes mellitus without complications: Secondary | ICD-10-CM

## 2014-04-19 DIAGNOSIS — I10 Essential (primary) hypertension: Secondary | ICD-10-CM

## 2014-04-19 LAB — GLUCOSE, CAPILLARY
GLUCOSE-CAPILLARY: 119 mg/dL — AB (ref 70–99)
GLUCOSE-CAPILLARY: 143 mg/dL — AB (ref 70–99)
GLUCOSE-CAPILLARY: 130 mg/dL — AB (ref 70–99)
Glucose-Capillary: 107 mg/dL — ABNORMAL HIGH (ref 70–99)

## 2014-04-19 NOTE — Progress Notes (Signed)
April [name] is a 80 y.o. female 03/23/1925 [id number]  Subjective: No new complaints. No new problems. Slept better. Feeling fair.  Objective: Vital signs in last 24 hours: Temp:  [98 F (36.7 C)-98.2 F (36.8 C)] 98.2 F (36.8 C) (04/26 0547) Pulse Rate:  [58-63] 58 (04/26 0547) Resp:  [17-18] 17 (04/26 0547) BP: (112-124)/(61-64) 112/64 mmHg (04/26 0547) SpO2:  [94 %-96 %] 96 % (04/26 0547) Weight change:  Last BM Date: 04/17/14  Intake/Output from previous day: 04/25 0701 - 04/26 0700 In: 680 [P.O.:680] Out: -  Last cbgs: CBG (last 3)   Recent Labs  04/18/14 1634 04/18/14 2037 04/19/14 0726  GLUCAP 105* 142* 107*     Physical Exam General: No apparent distress   HEENT: not dry Lungs: Normal effort. Lungs clear to auscultation, no crackles or wheezes. Cardiovascular: Regular rate and rhythm, no edema Abdomen: S/NT/ND; BS(+) Musculoskeletal:  unchanged Neurological: No new neurological deficits Wounds: clean   Skin: clear  Aging changes Mental state: Alert, oriented, cooperative    Lab Results: BMET    Component Value Date/Time   NA 142 04/13/2014 0530   K 4.2 04/13/2014 0530   CL 106 04/13/2014 0530   CO2 23 04/13/2014 0530   GLUCOSE 150* 04/13/2014 0530   BUN 23 04/13/2014 0530   CREATININE 1.17* 04/17/2014 0612   CALCIUM 9.1 04/13/2014 0530   GFRNONAA 41* 04/17/2014 0612   GFRAA 47* 04/17/2014 0612   CBC    Component Value Date/Time   WBC 7.6 04/13/2014 0530   RBC 3.17* 04/13/2014 0530   HGB 10.4* 04/13/2014 0530   HCT 30.7* 04/13/2014 0530   PLT 211 04/13/2014 0530   MCV 96.8 04/13/2014 0530   MCH 32.8 04/13/2014 0530   MCHC 33.9 04/13/2014 0530   RDW 14.1 04/13/2014 0530   LYMPHSABS 1.6 04/13/2014 0530   MONOABS 0.5 04/13/2014 0530   EOSABS 0.2 04/13/2014 0530   BASOSABS 0.0 04/13/2014 0530    Studies/Results: No results found.  Medications: I have reviewed the patient's current medications.  Assessment/Plan:  1. Left femoral neck fracture  after a fall. Status post left hip hemiarthroplasty 04/08/2014. Partial weightbearing with posterior hip precautions  2. DVT Prophylaxis/Anticoagulation: Subcutaneous Lovenox. Monitor platelet counts any signs of bleeding.  -dopplers negative  3. Pain Management: Oxycodone and Robaxin as needed with good control  4. Neuropsych: This patient is capable of making decisions on his own behalf.  5. Acute blood loss anemia. Followup CBC with improved hgb  6. Pseudomonas urinary tract infection. continue Cipro for 7 days---voiding better  7. Diabetes mellitus with peripheral neuropathy. Hemoglobin A1c 6.8. Lantus insulin 15 units each bedtime. Check blood sugars a.c. and at bedtime  -fair control generally---no changes  8. Hypertension. Norvasc 10 mg daily, Tenormin 100 mg daily, lisinopril 40 mg daily. Monitor with increased mobility  9. Hypothyroidism. Synthroid. Latest TSH level 4.400  10. Hyperlipidemia. Lipitor/ Lopid  Cont current Rx     Length of stay, days: 9  Tresa GarterAleksei V Rodina Pinales , MD 04/19/2014, 8:47 AM

## 2014-04-19 NOTE — Progress Notes (Signed)
Occupational Therapy Session Note  Patient Details  Name: April [name] MRN: [id number] Date of Birth: 09/20/1925  Today's Date: 04/19/2014 Time:  - 1630-1730   (60 min)    Short Term Goals: Week 1:  OT Short Term Goal 1 (Week 1): Patient will recall 3/3 posterior hip precautions during ADL session. OT Short Term Goal 2 (Week 1): Patient will complete bathing with minimum assistance with use of adaptive equipment. OT Short Term Goal 3 (Week 1): Patient will complete upper body dressing with modified independence. OT Short Term Goal 4 (Week 1): Patient will complete toileting with moderate assistance. OT Short Term Goal 5 (Week 1): Patient will complete lower body dressing with minimum assistance with use of adaptive equipment.     Skilled Therapeutic Interventions/Progress Updates:   Engaged in therapeutic Activity Group.  Addressed transfers to all surfaces (bed, 3n1, tub bench, sofa, chair with arms, chair without arms.  Ambulated to bathroom with RW to bathroom and transferred to 3n1 over toilet. Pt. Was SBA with sit to stand with elevated surfaces.  Required minimal cues throughout session for not turning LLE inward when getting out of bed.  Ambulated in kitchen and prepared peanut butter and cracker snack with SBA for balance.  Returned to room and left in wcwith call bell and phone by side.      Therapy Documentation Precautions:  Precautions Precautions: Fall;Posterior Hip Precaution Booklet Issued: Yes (comment) Precaution Comments: Reviewed precautions Required Braces or Orthoses:  ( ) Restrictions Weight Bearing Restrictions: Yes LLE Weight Bearing: Partial weight bearing LLE Partial Weight Bearing Percentage or Pounds: 50       Pain:  7/10  Surgical hip.  Informed nurse   Therapy/Group: Group Therapy  [name] 04/19/2014, 1:06 PM

## 2014-04-20 ENCOUNTER — Inpatient Hospital Stay (HOSPITAL_COMMUNITY): Payer: Worker's Compensation

## 2014-04-20 ENCOUNTER — Inpatient Hospital Stay (HOSPITAL_COMMUNITY): Payer: Self-pay

## 2014-04-20 DIAGNOSIS — W19XXXA Unspecified fall, initial encounter: Secondary | ICD-10-CM

## 2014-04-20 DIAGNOSIS — S72009A Fracture of unspecified part of neck of unspecified femur, initial encounter for closed fracture: Secondary | ICD-10-CM

## 2014-04-20 DIAGNOSIS — E119 Type 2 diabetes mellitus without complications: Secondary | ICD-10-CM

## 2014-04-20 DIAGNOSIS — N390 Urinary tract infection, site not specified: Secondary | ICD-10-CM

## 2014-04-20 DIAGNOSIS — I10 Essential (primary) hypertension: Secondary | ICD-10-CM

## 2014-04-20 LAB — GLUCOSE, CAPILLARY
Glucose-Capillary: 107 mg/dL — ABNORMAL HIGH (ref 70–99)
Glucose-Capillary: 125 mg/dL — ABNORMAL HIGH (ref 70–99)
Glucose-Capillary: 135 mg/dL — ABNORMAL HIGH (ref 70–99)
Glucose-Capillary: 144 mg/dL — ABNORMAL HIGH (ref 70–99)

## 2014-04-20 NOTE — Progress Notes (Signed)
Occupational Therapy Weekly Progress Note  Patient Details  Name: April [name] MRN: [id number] Date of Birth: 28-May-1925  Beginning of progress report period: April 11, 2014 (Saturday) End of progress report period: April 20, 2014 (Monday)  Today's Date: 04/20/2014  Patient has met 3 of 5 short term goals.  Progressing toward goal 1 (verbalizes 3/3 but requires cues to maintain during ADL), progressing toward goal 3 but requires setup assist due to confusion with placement and location of clothing in her room.  Patient continues to demonstrate the following deficits:  Impaired transfer, impaired performance of lower body dressing, impaired short-term memory and therefore will continue to benefit from skilled OT intervention to enhance overall performance with BADL.  Patient progressing toward long term goals..  Continue plan of care.  OT Short Term Goals Week 1:  OT Short Term Goal 1 (Week 1): Patient will recall 3/3 posterior hip precautions during ADL session. OT Short Term Goal 2 (Week 1): Patient will complete bathing with minimum assistance with use of adaptive equipment. OT Short Term Goal 3 (Week 1): Patient will complete upper body dressing with modified independence. OT Short Term Goal 4 (Week 1): Patient will complete toileting with moderate assistance. OT Short Term Goal 5 (Week 1): Patient will complete lower body dressing with minimum assistance with use of adaptive equipment. Week 2:     Therapy Documentation Precautions:  Precautions Precautions: Fall;Posterior Hip Precaution Booklet Issued: Yes (comment) Precaution Comments: Reviewed precautions Required Braces or Orthoses:  ( ) Restrictions Weight Bearing Restrictions: Yes LLE Weight Bearing: Partial weight bearing LLE Partial Weight Bearing Percentage or Pounds: 50%  Pain: Pain Assessment Pain Assessment: 0-10 Pain Score: 0-No pain  See FIM for current functional status  [name] 04/20/2014,  3:20 PM

## 2014-04-20 NOTE — Progress Notes (Signed)
Occupational Therapy Session Note  Patient Details  Name: April [name] MRN: [id number] Date of Birth: 04/07/1925  Today's Date: 04/20/2014 Time: 0730-0830 Time Calculation (min): 60 min  Short Term Goals: Week 1:  OT Short Term Goal 1 (Week 1): Patient will recall 3/3 posterior hip precautions during ADL session. OT Short Term Goal 2 (Week 1): Patient will complete bathing with minimum assistance with use of adaptive equipment. OT Short Term Goal 3 (Week 1): Patient will complete upper body dressing with modified independence. OT Short Term Goal 4 (Week 1): Patient will complete toileting with moderate assistance. OT Short Term Goal 5 (Week 1): Patient will complete lower body dressing with minimum assistance with use of adaptive equipment.  Skilled Therapeutic Interventions/Progress Updates: ADL-retraining with focus on improved performance with assisted bathing/dressing skills, functional transfers, endurance, and adherence to hip precautions.   Patient completed bath and shower level with setup assist but continues to require verbal cues to avoid internal/external rotation of LLE.   Patient required extra time this session to dress due to need for partial self-feeding; she completed upper body dressing with only setup assist but required max assist for lower body due to time limitation.   Plan to rehearse use of AE for lower body dressing during afternoon OT session.    Therapy Documentation Precautions:  Precautions Precautions: Fall;Posterior Hip Precaution Booklet Issued: Yes (comment) Precaution Comments: Reviewed precautions Required Braces or Orthoses:  ( ) Restrictions Weight Bearing Restrictions: Yes LLE Weight Bearing: Partial weight bearing LLE Partial Weight Bearing Percentage or Pounds: 50%  Pain: Pain Assessment Pain Assessment: 0-10 Pain Score: 0-No pain  See FIM for current functional status  Therapy/Group: Individual Therapy  Second session: Time:  1300-1345 Time Calculation (min):  45 min  Pain Assessment: 5/10, left hip  Skilled Therapeutic Interventions: ADL-retraining with focus on seated lower body dressing skills using AE, family education (on supervision of ADL and patient's current deficits), and bed transfer (sitting to supine, w/o use of  Bed rail).   Patient demonstrated improved coordination with use of sock aid and reacher this session but remains challenged by impaired problem-solving relating to selection of appropriate aid and is dependent on OT for directions.   Patient completed dressing task at edge of bed with moderate verbal cues and repeated demonstrations, rested briefly and completed grooming at sink (brushing teeth and hair) while standing with support of L-UE to maintain PWB precaution.  Patient then completed functional mobility using RW for 6892' and was escorted back to her room in w/c to return to bed.   Patient required mod assist to lift left leg into bed due to fatigue and was unable to coordinate hip/upper torso to rotate trunk and complete transfer as instructed using a pillow held between her knees and ankles.  See FIM for current functional status  Therapy/Group: Individual Therapy  Donzetta KohutFrank Briawna Carver 04/20/2014, 3:07 PM

## 2014-04-20 NOTE — Progress Notes (Addendum)
Physical Therapy Session Note  Patient Details  Name: April [name] MRN: [id number] Date of Birth: 01/09/1925  Today's Date: 04/20/2014 Time: 1111-1207 Time Calculation (min): 56 min   Skilled Therapeutic Interventions/Progress Updates:    Discussed home entry situation with pt and pt's daughters - daughters report that the company is not installing a ramp but modifying the tall step (9-11") into 2 steps so there will be a total of 4 steps. They were unsure of if there will be rails on one or both sides - will need to clarify. Practiced technique with bilateral rails and with 1 rail on L (using both hands) and pt requires min A overall - cues for encouragement and which foot to lead up/down with. W/c propulsion for endurance and strengthening of UE's with extra time and multiple rest breaks this AM. Introduced standing LE HEP for LLE including marching, hip abduction, hip extension, and hamstring curls x 10 reps x 2 sets with rest breaks as needed. Gait with RW x 25' with S and cues for placement of RW to increase gait efficiency as well.    Goals downgraded to overall S level due to pt still requiring cues for adherence to precautions during functional mobility and for safety.  Therapy Documentation Precautions:  Precautions Precautions: Fall;Posterior Hip Precaution Booklet Issued: Yes (comment) Precaution Comments: Reviewed precautions Required Braces or Orthoses:  ( ) Restrictions Weight Bearing Restrictions: Yes LLE Weight Bearing: Partial weight bearing LLE Partial Weight Bearing Percentage or Pounds: 50%  Pain: Premedicated for pain in L hip. Rest breaks as needed.   See FIM for current functional status  Therapy/Group: Individual Therapy  Philip Aspenlison B Jamilyn Pigeon 04/20/2014, 12:10 PM

## 2014-04-20 NOTE — Progress Notes (Signed)
Physical Therapy Session Note  Patient Details  Name: April [name] MRN: [id number] Date of Birth: 03/09/1925  Today's Date: 04/20/2014 Time: [id number] Time Calculation (min): 30 min  Short Term Goals: Week 2:  PT Short Term Goal 1 (Week 2): = LTGs  Skilled Therapeutic Interventions/Progress Updates:    Session focused on bed mobility re-training in ADL apartment bed to simulate home environment using leg lifter. Pt requiring cues to adhere to hip precautions but with repetition able to return demonstrate successfully on third attempt. Once returned to room with pt request to lay down, pt unable to recall appropriate sequencing requiring min verbal cueing for technique for sit to supine. Recommended to family to purchase leg lifter for pt to use. Left practice one in the room and encouraged pt to practice with it each time she gets in/out of the bed - verbalized understanding.   Therapy Documentation Precautions:  Precautions Precautions: Fall;Posterior Hip Precaution Booklet Issued: Yes (comment) Precaution Comments: Reviewed precautions Required Braces or Orthoses:  ( ) Restrictions Weight Bearing Restrictions: Yes LLE Weight Bearing: Partial weight bearing LLE Partial Weight Bearing Percentage or Pounds: 50% Pain: Reports pain still present in L hip - premedicated.    See FIM for current functional status  Therapy/Group: Individual Therapy  Philip Aspenlison B Merikay Lesniewski 04/20/2014, 3:15 PM

## 2014-04-20 NOTE — Progress Notes (Signed)
[place] PHYSICAL MEDICINE & REHABILITATION     PROGRESS NOTE    Subjective/Complaints: No problems over weekend. Slept well. No pain   A 12 point review of systems has been performed and if not noted above is otherwise negative.    Objective: Vital Signs: Blood pressure 151/71, pulse 62, temperature 98.3 F (36.8 C), temperature source Oral, resp. rate 18, height 5\' 8"  (1.727 m), weight 82.8 kg (182 lb 8.7 oz), SpO2 94.00%. No results found. No results found for this basename: WBC, HGB, HCT, PLT,  in the last 72 hours No results found for this basename: NA, K, CL, CO, GLUCOSE, BUN, CREATININE, CALCIUM,  in the last 72 hours CBG (last 3)   Recent Labs  04/19/14 1650 04/19/14 2053 04/20/14 0724  GLUCAP 143* 130* 107*    Wt Readings from Last 3 Encounters:  04/10/14 82.8 kg (182 lb 8.7 oz)  04/07/14 81.647 kg (180 lb)  04/07/14 81.647 kg (180 lb)    Physical Exam:  Constitutional: She is oriented to person, place, and time. She appears well-developed.  HENT: oral mucosa pink and moist. Dentition fair  Head: Normocephalic.  Eyes: EOM are normal.  Neck: Normal range of motion. Neck supple. No thyromegaly present.  Cardiovascular: Normal rate and regular rhythm. No murmurs, rubs, or gallops  Respiratory: Effort normal and breath sounds normal. No respiratory distress. No wheezes, rales, or rhonchi  GI: Soft. Bowel sounds are normal. She exhibits no distension. Non-tender  Skin:  Hip incision clean and dry, staples intact Psychiatric: She has a normal mood and affect. Very pleasant  Neuro: A and O x 3.  motor strength bilateral upper extremities 5/5 in the , bicep, tricep, grip, shoulders limited by RTC/OA and are approximately 3+ to 4-  Right lower extremity 4/5 in the hip flexor knee extensor ankle dorsiflexor plantar flexor  2-/5 left hip flexor, - KE, 4/5 left ankle dorsiflexor plantar flexor.   Sensory intact to light touch in the upper and lower limbs. Cognitively  intact. CN exam normal   Assessment/Plan: 1. Functional deficits secondary to left femoral neck fracture after fall s/p left hip hemiarthroplasty which require 3+ hours per day of interdisciplinary therapy in a comprehensive inpatient rehab setting. Physiatrist is providing close team supervision and 24 hour management of active medical problems listed below. Physiatrist and rehab team continue to assess barriers to discharge/monitor patient progress toward functional and medical goals. FIM: FIM - Bathing Bathing Steps Patient Completed: Chest;Right Arm;Left Arm;Abdomen;Front perineal area;Buttocks;Right upper leg;Left upper leg;Right lower leg (including foot);Left lower leg (including foot) Bathing: 6: Assistive device (Comment)  FIM - Upper Body Dressing/Undressing Upper body dressing/undressing steps patient completed: Thread/unthread right sleeve of pullover shirt/dresss;Thread/unthread left sleeve of pullover shirt/dress;Put head through opening of pull over shirt/dress;Pull shirt over trunk Upper body dressing/undressing: 7: Complete Independence: No helper FIM - Lower Body Dressing/Undressing Lower body dressing/undressing steps patient completed: Pull pants up/down Lower body dressing/undressing: 2: Max-Patient completed 25-49% of tasks  FIM - Toileting Toileting steps completed by patient: Adjust clothing prior to toileting;Performs perineal hygiene;Adjust clothing after toileting Toileting: 4: Steadying assist  FIM - Diplomatic Services operational officerToilet Transfers Toilet Transfers Assistive Devices: Elevated toilet seat;Walker Toilet Transfers: 4-To toilet/BSC: Min A (steadying Pt. > 75%);4-From toilet/BSC: Min A (steadying Pt. > 75%)  FIM - Bed/Chair Transfer Bed/Chair Transfer Assistive Devices: Therapist, occupationalWalker Bed/Chair Transfer: 4: Supine > Sit: Min A (steadying Pt. > 75%/lift 1 leg);5: Bed > Chair or W/C: Supervision (verbal cues/safety issues);4: Sit > Supine: Min A (steadying  pt. > 75%/lift 1 leg);5: Chair or  W/C > Bed: Supervision (verbal cues/safety issues)  FIM - Locomotion: Wheelchair Distance: 150 Locomotion: Wheelchair: 1: Total Assistance/staff pushes wheelchair (Pt<25%) FIM - Locomotion: Ambulation Locomotion: Ambulation Assistive Devices: Designer, industrial/productWalker - Rolling Ambulation/Gait Assistance: 5: Supervision Locomotion: Ambulation: 1: Travels less than 50 ft with supervision/safety issues  Comprehension Comprehension Mode: Auditory Comprehension: 7-Follows complex conversation/direction: With no assist  Expression Expression Mode: Verbal Expression: 7-Expresses complex ideas: With no assist  Social Interaction Social Interaction: 7-Interacts appropriately with others - No medications needed.  Problem Solving Problem Solving: 7-Solves complex problems: Recognizes & self-corrects  Memory Memory: 7-Complete Independence: No helper  Medical Problem List and Plan:  1. Left femoral neck fracture after a fall. Status post left hip hemiarthroplasty 04/08/2014. Partial weightbearing with posterior hip precautions   -remove staples today 2. DVT Prophylaxis/Anticoagulation: Subcutaneous Lovenox. Monitor platelet counts any signs of bleeding.   -dopplers negative 3. Pain Management: Oxycodone and Robaxin as needed with good control 4. Neuropsych: This patient is capable of making decisions on his own behalf.  5. Acute blood loss anemia. Followup CBC with improved hgb 6. Pseudomonas urinary tract infection. continue Cipro for 7 days---voiding better 7. Diabetes mellitus with peripheral neuropathy. Hemoglobin A1c 6.8. Lantus insulin 15 units each bedtime. Check blood sugars a.c. and at bedtime   -good control at present 8. Hypertension. Norvasc 10 mg daily, Tenormin 100 mg daily, lisinopril 40 mg daily. Monitor with increased mobility  9. Hypothyroidism. Synthroid. Latest TSH level 4.400  10. Hyperlipidemia. Lipitor/ Lopid    LOS (Days) 10 A FACE TO FACE EVALUATION WAS PERFORMED  Ranelle OysterZachary T  Tanveer Brammer 04/20/2014 8:19 AM

## 2014-04-20 NOTE — Progress Notes (Signed)
All staples removed from Left hip incision, steri strips applied at 4 locations, allevyn dressings applied. Pt tolerated procedure will.

## 2014-04-21 ENCOUNTER — Inpatient Hospital Stay (HOSPITAL_COMMUNITY): Payer: Self-pay

## 2014-04-21 ENCOUNTER — Inpatient Hospital Stay (HOSPITAL_COMMUNITY): Payer: Worker's Compensation

## 2014-04-21 DIAGNOSIS — N390 Urinary tract infection, site not specified: Secondary | ICD-10-CM

## 2014-04-21 DIAGNOSIS — E119 Type 2 diabetes mellitus without complications: Secondary | ICD-10-CM

## 2014-04-21 DIAGNOSIS — W19XXXA Unspecified fall, initial encounter: Secondary | ICD-10-CM

## 2014-04-21 DIAGNOSIS — I10 Essential (primary) hypertension: Secondary | ICD-10-CM

## 2014-04-21 DIAGNOSIS — S72009A Fracture of unspecified part of neck of unspecified femur, initial encounter for closed fracture: Secondary | ICD-10-CM

## 2014-04-21 LAB — GLUCOSE, CAPILLARY
GLUCOSE-CAPILLARY: 113 mg/dL — AB (ref 70–99)
GLUCOSE-CAPILLARY: 129 mg/dL — AB (ref 70–99)
GLUCOSE-CAPILLARY: 145 mg/dL — AB (ref 70–99)
Glucose-Capillary: 99 mg/dL (ref 70–99)

## 2014-04-21 NOTE — Progress Notes (Signed)
Physical Therapy Session Note  Patient Details  Name: April [name] MRN: [id number] Date of Birth: 07/05/1925  Today's Date: 04/21/2014 Time: 1400-1435 Time Calculation (min): 35 min  Skilled Therapeutic Interventions/Progress Updates:  1:1. Pt received sitting EOB w/ friends/family in room. Focus this session on B LE therex and reviewing hip precautions. Pt req supervision for toielting and amb in room w/ RW, briefly at start of session. Pt w/ good tolerance to alternated seated and standing therex to address strength. Seated therex included 2x10 reps of: glute sets, marching, LAQ; Standing therex included 2x10reps of L LE: marching, hamstring curls, hip abd, hip ext. Pt able to verbalize 4/4 posterior hip precautions without cues. Pt sittingin w/c at end of session w/ all needs in reach, friends/family in room.   Therapy Documentation Precautions:  Precautions Precautions: Fall;Posterior Hip Precaution Booklet Issued: Yes (comment) Precaution Comments: Reviewed precautions Required Braces or Orthoses:  ( ) Restrictions Weight Bearing Restrictions: Yes LLE Weight Bearing: Partial weight bearing LLE Partial Weight Bearing Percentage or Pounds: 50%  See FIM for current functional status  Therapy/Group: Individual Therapy  Denzil HughesCaroline S Bailea Beed 04/21/2014, 2:42 PM

## 2014-04-21 NOTE — Progress Notes (Signed)
[place] PHYSICAL MEDICINE & REHABILITATION     PROGRESS NOTE    Subjective/Complaints: No new issues. Had another good day with therapies. Excited to get home.    A 12 point review of systems has been performed and if not noted above is otherwise negative.    Objective: Vital Signs: Blood pressure 151/73, pulse 62, temperature 97.7 F (36.5 C), temperature source Oral, resp. rate 18, height 5\' 8"  (1.727 m), weight 82.8 kg (182 lb 8.7 oz), SpO2 96.00%. No results found. No results found for this basename: WBC, HGB, HCT, PLT,  in the last 72 hours No results found for this basename: NA, K, CL, CO, GLUCOSE, BUN, CREATININE, CALCIUM,  in the last 72 hours CBG (last 3)   Recent Labs  04/20/14 1625 04/20/14 2108 04/21/14 0715  GLUCAP 135* 144* 99    Wt Readings from Last 3 Encounters:  04/10/14 82.8 kg (182 lb 8.7 oz)  04/07/14 81.647 kg (180 lb)  04/07/14 81.647 kg (180 lb)    Physical Exam:  Constitutional: She is oriented to person, place, and time. She appears well-developed.  HENT: oral mucosa pink and moist. Dentition fair  Head: Normocephalic.  Eyes: EOM are normal.  Neck: Normal range of motion. Neck supple. No thyromegaly present.  Cardiovascular: Normal rate and regular rhythm. No murmurs, rubs, or gallops  Respiratory: Effort normal and breath sounds normal. No respiratory distress. No wheezes, rales, or rhonchi  GI: Soft. Bowel sounds are normal. She exhibits no distension. Non-tender  Skin:  Hip incision clean and dry, wound intact, staples out Psychiatric: She has a normal mood and affect. Very pleasant  Neuro: A and O x 3.  motor strength bilateral upper extremities 5/5 in the , bicep, tricep, grip, shoulders limited by RTC/OA and are approximately 3+ to 4-  Right lower extremity 4/5 in the hip flexor knee extensor ankle dorsiflexor plantar flexor  2-/5 left hip flexor, - KE, 4/5 left ankle dorsiflexor plantar flexor.   Sensory intact to light touch in  the upper and lower limbs. Cognitively intact. CN exam normal   Assessment/Plan: 1. Functional deficits secondary to left femoral neck fracture after fall s/p left hip hemiarthroplasty which require 3+ hours per day of interdisciplinary therapy in a comprehensive inpatient rehab setting. Physiatrist is providing close team supervision and 24 hour management of active medical problems listed below. Physiatrist and rehab team continue to assess barriers to discharge/monitor patient progress toward functional and medical goals. FIM: FIM - Bathing Bathing Steps Patient Completed: Chest;Right Arm;Left Arm;Abdomen;Front perineal area;Buttocks;Right upper leg;Left upper leg;Right lower leg (including foot);Left lower leg (including foot) Bathing: 5: Supervision: Safety issues/verbal cues  FIM - Upper Body Dressing/Undressing Upper body dressing/undressing steps patient completed: Thread/unthread right sleeve of pullover shirt/dresss;Thread/unthread left sleeve of pullover shirt/dress;Put head through opening of pull over shirt/dress;Pull shirt over trunk Upper body dressing/undressing: 5: Set-up assist to: Obtain clothing/put away FIM - Lower Body Dressing/Undressing Lower body dressing/undressing steps patient completed: Pull pants up/down Lower body dressing/undressing: 2: Max-Patient completed 25-49% of tasks  FIM - Toileting Toileting steps completed by patient: Adjust clothing prior to toileting;Performs perineal hygiene;Adjust clothing after toileting Toileting Assistive Devices: Grab bar or rail for support Toileting: 5: Supervision: Safety issues/verbal cues  FIM - Diplomatic Services operational officerToilet Transfers Toilet Transfers Assistive Devices: Elevated toilet seat;Walker Toilet Transfers: 4-To toilet/BSC: Min A (steadying Pt. > 75%);4-From toilet/BSC: Min A (steadying Pt. > 75%)  FIM - BankerBed/Chair Transfer Bed/Chair Transfer Assistive Devices: Walker;Arm rests (leg lifter) Bed/Chair Transfer: 5: Supine >  Sit:  Supervision (verbal cues/safety issues);5: Sit > Supine: Supervision (verbal cues/safety issues);5: Bed > Chair or W/C: Supervision (verbal cues/safety issues);5: Chair or W/C > Bed: Supervision (verbal cues/safety issues)  FIM - Locomotion: Wheelchair Distance: 150 Locomotion: Wheelchair: 5: Travels 150 ft or more: maneuvers on rugs and over door sills with supervision, cueing or coaxing FIM - Locomotion: Ambulation Locomotion: Ambulation Assistive Devices: Designer, industrial/productWalker - Rolling Ambulation/Gait Assistance: 5: Supervision Locomotion: Ambulation: 1: Travels less than 50 ft with supervision/safety issues  Comprehension Comprehension Mode: Auditory Comprehension: 5-Follows basic conversation/direction: With extra time/assistive device  Expression Expression Mode: Verbal Expression: 7-Expresses complex ideas: With no assist  Social Interaction Social Interaction: 7-Interacts appropriately with others - No medications needed.  Problem Solving Problem Solving: 7-Solves complex problems: Recognizes & self-corrects  Memory Memory: 7-Complete Independence: No helper  Medical Problem List and Plan:  1. Left femoral neck fracture after a fall. Status post left hip hemiarthroplasty 04/08/2014. Partial weightbearing with posterior hip precautions     2. DVT Prophylaxis/Anticoagulation: Subcutaneous Lovenox. Monitor platelet counts any signs of bleeding.   -dopplers negative 3. Pain Management: Oxycodone and Robaxin as needed with good control 4. Neuropsych: This patient is capable of making decisions on his own behalf.  5. Acute blood loss anemia. Followup CBC with improved hgb 6. Pseudomonas urinary tract infection. abx completed 7. Diabetes mellitus with peripheral neuropathy. Hemoglobin A1c 6.8. Lantus insulin 15 units each bedtime. Check blood sugars a.c. and at bedtime   -good control  8. Hypertension. Norvasc 10 mg daily, Tenormin 100 mg daily, lisinopril 40 mg daily. Monitor with increased  mobility  9. Hypothyroidism. Synthroid. Latest TSH level 4.400  10. Hyperlipidemia. Lipitor/ Lopid    LOS (Days) 11 A FACE TO FACE EVALUATION WAS PERFORMED  Ranelle OysterZachary T Swartz 04/21/2014 8:20 AM

## 2014-04-21 NOTE — Progress Notes (Signed)
Occupational Therapy Session Note  Patient Details  Name: April [name] MRN: [id number] Date of Birth: 11/01/1925  Today's Date: 04/21/2014 Time: 1030-1129 Time Calculation (min): 59 min  Short Term Goals: Week 2:  OT Short Term Goal 1 (Week 2): STG=LTG due to brief LOS remaining  Skilled Therapeutic Interventions/Progress Updates: ADL-retraining with focus on improved skill with use of AE, transfers, memory, adherence to hip precautions, problem-solving, family education.   Patient received in w/c awaiting ADL.   Patient completed functional mobility using RW with supervision, completed transfer from RW to tub bench in standard tub, undressed using lateral leans and reacher to pull down pants, bathed unassisted using LH sponge and hand shower to reach feet and completed dressing in chair beside tub with min verbal cues to problem solve.   Patient required extra time and seeks validation of her technique throughout session but no longer requires hand guidance to perform ADL tasks.  While being escorted back to her room in her w/c, family encounter was complete with both daughters who were advised on use of AE and need for tub bench and BSC.   Family anticipates providing supervision and intermittent min assist although patient reiterates desire to be "as independent as possible."  OT re-educated patient on assist required for adherence to hip precautions and safety during transfers and activities requiring dynamic standing balance.    Therapy Documentation Precautions:  Precautions Precautions: Fall;Posterior Hip Precaution Booklet Issued: Yes (comment) Precaution Comments: Reviewed precautions Required Braces or Orthoses:  ( ) Restrictions Weight Bearing Restrictions: Yes LLE Weight Bearing: Partial weight bearing LLE Partial Weight Bearing Percentage or Pounds: 50%  Pain: Pain Assessment Pain Assessment: 0-10 Pain Score: 1   See FIM for current functional status  Therapy/Group:  Individual Therapy  Second session: Time:1330-1400 Time Calculation (min):  30 min  Pain Assessment: No/denies pain  Skilled Therapeutic Interventions: Therapeutic exercises with focus on UE exercises.   Patient performed seated UE exercises with demonstration and supervision to complete movements.   Patient becomes confused when presented with resistance (dumbells) but is able to perform required ROM for chest press, bicep curls, seated rowing, and partial shoulder raises (limited by hx of RTC).    Patient and daughter present were advised that patient would benefit from seated functional tasks to improve UE with examples provided: folding clothes, washes dishes, polishing furniture (table tops), and cleaning windows (within reach seated).   Patient left with family present, awaiting physical therapist.  See FIM for current functional status  Therapy/Group: Individual Therapy  Donzetta KohutFrank Jakeob Tullis 04/21/2014, 1:05 PM

## 2014-04-21 NOTE — Progress Notes (Deleted)
PMR Admission Coordinator Pre-Admission Assessment  Patient: April [name] is an 80 y.o., female MRN: [id number] DOB: [birth year] Height: 5\' 8"  (172.7 cm) Weight: 82.8 kg (182 lb 8.7 oz)              Insurance Information HMO:     PPO:      PCP:      IPA:      80/20:      OTHER:  PRIMARY: Workers Compensation      Policy#: [id number]      Subscriber: pt CM Name: Cheryl Swaziland      Phone#: [phone]     Fax#: [phone] Pre-Cert#: [id number]     Employer: [employer] follow up with Elnita Maxwell as requested Update Thursday when Elnita Maxwell comes onsite.  SECONDARY: BCBS of [place]      Policy#: [id number]      Subscriber: pt  Third:  Medicare a and B [id number] a effective a 11/24/92 b 06/25/03  Medicaid Application Date:       Case Manager:  Disability Application Date:       Case Worker:   Emergency Contact Information Contact Information   Name Relation Home Work Mobile   Smith,Sharon Daughter   [phone]   Jiovanna, Frei   [phone]   Epling,Judy Daughter [phone]  [phone]     Current Medical History  Patient Admitting Diagnosis: Left femoral neck fracture status post fall 04/07/2014  History of Present Illness:April [name] is a 80 y.o. right-handed female with history of hypertension, diabetes mellitus with peripheral neuropathy. Patient lives with her daughter and was independent prior to admission.   Presented 04/07/2014 after a fall on a wet floor while at work with [employer]. There was no loss of consciousness. X-rays and imaging revealed a displaced left femoral neck fracture. Underwent left hip hemiarthroplasty 04/08/2014 per Dr. Ranell Patrick. Postoperative pain management. Partial weightbearing with posterior hip cautions. Placed on subcutaneous Lovenox for DVT prophylaxis.    Past Medical History  Past Medical History  Diagnosis Date  . Hypertension   . Diabetes mellitus without complication   . HTN (hypertension) 04/07/2014  .  Hyperlipidemia 04/07/2014  . Hypothyroidism 04/07/2014  . OA (osteoarthritis) 04/07/2014    Family History  family history is not on file.  Prior Rehab/Hospitalizations: none  Current Medications  Current facility-administered medications:acetaminophen (TYLENOL) tablet 325-650 mg, 325-650 mg, Oral, Q4H PRN, Mcarthur Rossetti Angiulli, PA-C, 650 mg at 04/20/14 1111;  amLODipine (NORVASC) tablet 10 mg, 10 mg, Oral, Daily, Mcarthur Rossetti Angiulli, PA-C, 10 mg at 04/21/14 3557;  atenolol (TENORMIN) tablet 100 mg, 100 mg, Oral, Daily, Mcarthur Rossetti Angiulli, PA-C, 100 mg at 04/21/14 0736 atorvastatin (LIPITOR) tablet 10 mg, 10 mg, Oral, q1800, Mcarthur Rossetti Angiulli, PA-C, 10 mg at 04/20/14 1717;  ciprofloxacin (CIPRO) tablet 500 mg, 500 mg, Oral, BID, Mcarthur Rossetti Angiulli, PA-C, 500 mg at 04/21/14 3220;  docusate sodium (COLACE) capsule 100 mg, 100 mg, Oral, BID, Mcarthur Rossetti Angiulli, PA-C, 100 mg at 04/21/14 0736;  enoxaparin (LOVENOX) injection 30 mg, 30 mg, Subcutaneous, Q12H, Mcarthur Rossetti Angiulli, PA-C, 30 mg at 04/21/14 1019 fenofibrate tablet 160 mg, 160 mg, Oral, Daily, Mcarthur Rossetti Angiulli, PA-C, 160 mg at 04/21/14 0736;  insulin aspart (novoLOG) injection 0-15 Units, 0-15 Units, Subcutaneous, TID WC, Mcarthur Rossetti Angiulli, PA-C, 2 Units at 04/20/14 1717;  insulin glargine (LANTUS) injection 20 Units, 20 Units, Subcutaneous, QHS, Mcarthur Rossetti Angiulli, PA-C, 20 Units at 04/20/14 2111 levothyroxine (SYNTHROID, LEVOTHROID) tablet 125 mcg, 125 mcg, Oral, QAC breakfast,  Mcarthur Rossettianiel J Angiulli, PA-C, 125 mcg at 04/21/14 [id number];  lisinopril (PRINIVIL,ZESTRIL) tablet 40 mg, 40 mg, Oral, Daily, Mcarthur RossettiDaniel J Angiulli, PA-C, 40 mg at 04/21/14 0736;  ondansetron (ZOFRAN) injection 4 mg, 4 mg, Intravenous, Q6H PRN, Mcarthur Rossettianiel J Angiulli, PA-C;  ondansetron (ZOFRAN) tablet 4 mg, 4 mg, Oral, Q6H PRN, Mcarthur Rossettianiel J Angiulli, PA-C oxyCODONE (Oxy IR/ROXICODONE) immediate release tablet 5-10 mg, 5-10 mg, Oral, Q4H PRN, Mcarthur Rossettianiel J Angiulli, PA-C, 10 mg at 04/21/14 1444;  OxyCODONE  (OXYCONTIN) 12 hr tablet 10 mg, 10 mg, Oral, Q12H, Mcarthur RossettiDaniel J Angiulli, PA-C, 10 mg at 04/21/14 0736;  pantoprazole (PROTONIX) EC tablet 40 mg, 40 mg, Oral, Daily, Mcarthur RossettiDaniel J Angiulli, PA-C, 40 mg at 04/21/14 [id number] polyethylene glycol (MIRALAX / GLYCOLAX) packet 17 g, 17 g, Oral, Daily PRN, Mcarthur RossettiDaniel J Angiulli, PA-C, 17 g at 04/14/14 1632;  senna-docusate (Senokot-S) tablet 2 tablet, 2 tablet, Oral, BID, Mcarthur Rossettianiel J Angiulli, PA-C, 2 tablet at 04/21/14 0736;  sorbitol 70 % solution 30 mL, 30 mL, Oral, Daily PRN, Ranelle OysterZachary T Swartz, MD, 30 mL at 04/15/14 0647  Patients Current Diet: Carb Control  Precautions / Restrictions Precautions Precautions: Fall;Posterior Hip Precaution Booklet Issued: Yes (comment) Precaution Comments: Reviewed precautions Restrictions Weight Bearing Restrictions: Yes LLE Weight Bearing: Partial weight bearing LLE Partial Weight Bearing Percentage or Pounds: 50%   Prior Activity Level    Home Assistive Devices / Equipment    Prior Functional Level Prior Function Comments: arts, music, going to church  Current Functional Level Cognition  Arousal/Alertness: Awake/alert Overall Cognitive Status: Within Functional Limits for tasks assessed Orientation Level: Oriented X4 Safety/Judgment: Appears intact Comments: able to recall PWB precautions and able to recall 2/3 posterior hip precautions; requires verbal cues to maintain hip precautions    Extremity Assessment (includes Sensation/Coordination)          ADLs       Mobility       Transfers       Ambulation / Gait / Stairs / Wheelchair Mobility  Ambulation/Gait Ambulation Distance (Feet): 5 Feet Gait velocity: decreased Wheelchair Mobility Distance: 150    Posture / Balance Static Sitting Balance Static Sitting - Balance Support: Feet supported;Right upper extremity supported Static Sitting - Level of Assistance: 7: Independent Static Sitting - Comment/# of Minutes: 10 Dynamic Sitting Balance Dynamic  Sitting - Balance Support: Right upper extremity supported;Feet supported Dynamic Sitting - Level of Assistance: 7: Independent Dynamic Sitting - Balance Activities: Lateral lean/weight shifting;Forward lean/weight shifting Static Standing Balance Static Standing - Balance Support: Bilateral upper extremity supported;During functional activity Static Standing - Level of Assistance: 3: Mod assist;4: Min assist Static Standing - Comment/# of Minutes: impaired WB through LLE resulting in trunk lean to R and difficulty maintaining upright posture in middle of RW, ineffective use of BUEs due to baseline weakness/previous injury    Special needs/care consideration Bowel mgmt: no BM since admission Bladder mgmt: urinary retention this am Diabetic mgmt  yes   Previous Home Environment Living Arrangements: Children  Lives With: Daughter Jasmine December(Sharon) Available Help at Discharge: Family;Available 24 hours/day Type of Home: House Home Layout: One level Home Access: Stairs to enter Entrance Stairs-Rails: Right Entrance Stairs-Number of Steps: 3 - two standard steps and one 9-10 inch step Bathroom Shower/Tub: Engineer, manufacturing systemsTub/shower unit Bathroom Toilet: Standard Bathroom Accessibility: Yes How Accessible: Accessible via walker Additional Comments: family member that works for [employer] provided, pt has not been educated in use  Discharge Living Setting Does the patient have any problems obtaining your medications?: No  Social/Family/Support Systems Patient Roles: Parent;Other (Comment) (Employee) Anticipated Caregiver: [name] lives with her.  Patient wants to be mod/i level Ability/Limitations of Caregiver: Darel HongJudy [place] and [place]-can not provide much physical care Caregiver Availability: 24/7    Goals/Additional Needs     Decrease burden of Care through IP rehab admission: n/a  Possible need for SNF placement upon discharge:no  Patient Condition: This patient's condition remains  as documented in the consult dated 04/09/14, in which the Rehabilitation Physician determined and documented that the patient's condition is appropriate for intensive rehabilitative care in an inpatient rehabilitation facility. Will admit to inpatient rehab today.  Preadmission Screen Completed By:  Clois DupesBarbara Godwin Hazaiah [place], 04/21/2014 3:17 PM ______________________________________________________________________   Discussed status with Dr. Riley KillSwartz on 04/10/14 at  1350 and received telephone approval for admission today.  Admission Coordinator:  Clois DupesBarbara Godwin Denisa Enterline, time 1350 Date 04/10/2014.

## 2014-04-21 NOTE — Progress Notes (Signed)
Physical Therapy Session Note  Patient Details  Name: Nelida GoresValerie A Clair MRN: [id number] Date of Birth: 08/29/1925  Today's Date: 04/21/2014 Time: 0830-0927 Time Calculation (min): 57 min  Short Term Goals: Week 2:  PT Short Term Goal 1 (Week 2): = LTGs  Skilled Therapeutic Interventions/Progress Updates:    Using reacher to manage clothing and RW for balance, pt donned pants with overall close S/steady A. Focused on gait training for endurance, strengthening, and increasing distance for decreased reliance on w/c x 60', x 30', x 60' with close S and cues for gait pattern. Stair negotiation to simulate home entry with bilateral rails and 1 rail (unsure yet of which set-up pt will have at home). Pt able to go up/down 4 steps with bilateral rails with close S (cues for technique and WB precautions) but min A for use of 1 rail. Reviewed simulated car transfer with S (again, pt unable to recall correct technique without cueing to maintain precautions) with RW. W/c propulsion on unit for general strengthening and endurance of UE's with S (cues needed at times for hand placement). Overall pt making excellent progress but continues to require cueing for safety, encouragement, and maintaining precautions.   Therapy Documentation Precautions:  Precautions Precautions: Fall;Posterior Hip Precaution Booklet Issued: Yes (comment) Precaution Comments: Reviewed precautions Required Braces or Orthoses:  ( ) Restrictions Weight Bearing Restrictions: Yes LLE Weight Bearing: Partial weight bearing LLE Partial Weight Bearing Percentage or Pounds: 50%  Pain:  Premedicated. Reports hip feels better without staples.  See FIM for current functional status  Therapy/Group: Individual Therapy  Philip Aspenlison B Kaydense Rizo 04/21/2014, 9:28 AM

## 2014-04-22 ENCOUNTER — Inpatient Hospital Stay (HOSPITAL_COMMUNITY): Payer: Self-pay

## 2014-04-22 ENCOUNTER — Inpatient Hospital Stay (HOSPITAL_COMMUNITY): Payer: Worker's Compensation

## 2014-04-22 DIAGNOSIS — W19XXXA Unspecified fall, initial encounter: Secondary | ICD-10-CM

## 2014-04-22 DIAGNOSIS — S72009A Fracture of unspecified part of neck of unspecified femur, initial encounter for closed fracture: Secondary | ICD-10-CM

## 2014-04-22 DIAGNOSIS — N390 Urinary tract infection, site not specified: Secondary | ICD-10-CM

## 2014-04-22 DIAGNOSIS — I10 Essential (primary) hypertension: Secondary | ICD-10-CM

## 2014-04-22 DIAGNOSIS — E119 Type 2 diabetes mellitus without complications: Secondary | ICD-10-CM

## 2014-04-22 LAB — GLUCOSE, CAPILLARY
GLUCOSE-CAPILLARY: 94 mg/dL (ref 70–99)
GLUCOSE-CAPILLARY: 88 mg/dL (ref 70–99)
Glucose-Capillary: 82 mg/dL (ref 70–99)
Glucose-Capillary: 170 mg/dL — ABNORMAL HIGH (ref 70–99)

## 2014-04-22 NOTE — Progress Notes (Signed)
PMR Admission Coordinator Pre-Admission Assessment  Patient: April [name] is an 80 y.o., female  MRN: [id number]  DOB: 08/06/1925  Height: 5\' 8"  (172.7 cm)  Weight: 81.647 kg (180 lb)  Insurance Information  HMO: PPO: PCP: IPA: 80/20: OTHER:  PRIMARY: Workers Compensation Policy#: [id number] Subscriber: pt  CM Name: Cheryl SwazilandJordan Phone#: [phone] Fax#: [phone]  Pre-Cert#: tba Employer: [employer] follow up with Elnita Maxwellheryl as requested  Update Thursday when Elnita Maxwellheryl comes onsite.   SECONDARY: BCBS of [place] Policy#: [id number] Subscriber: pt  Third: Medicare a and B [id number] a effective a 11/24/92 b 06/25/03  Medicaid Application Date: Case Manager:  Disability Application Date: Case Worker:  Emergency Contact Information  Contact Information    Name  Relation  Home  Work  Mobile    Smith,Sharon  Daughter    [phone]    Danne BaxterMarshall,Jerry  Son    [phone]    Epling,Judy  Daughter  [phone]   [phone]      Current Medical History  Patient Admitting Diagnosis: Left femoral neck fracture status post fall 04/07/2014  History of Present Illness:April [name] is a 80 y.o. right-handed female with history of hypertension, diabetes mellitus with peripheral neuropathy. Patient lives with her daughter and was independent prior to admission.  Presented 04/07/2014 after a fall on a wet floor while at work with [employer]. There was no loss of consciousness. X-rays and imaging revealed a displaced left femoral neck fracture. Underwent left hip hemiarthroplasty 04/08/2014 per Dr. Ranell PatrickNorris. Postoperative pain management. Partial weightbearing with posterior hip cautions. Placed on subcutaneous Lovenox for DVT prophylaxis.  Past Medical History  Past Medical History   Diagnosis  Date   .  Hypertension    .  Diabetes mellitus without complication    .  HTN (hypertension)  04/07/2014   .  Hyperlipidemia  04/07/2014   .  Hypothyroidism  04/07/2014   .  OA  (osteoarthritis)  04/07/2014    Family History  family history is not on file.  Prior Rehab/Hospitalizations: none  Current Medications  Current facility-administered medications:0.9 % sodium chloride infusion, , Intravenous, Continuous, Verlee RossettiSteven R Norris, MD, Last Rate: 20 mL/hr at 04/09/14 2000; acetaminophen (TYLENOL) suppository 650 mg, 650 mg, Rectal, Q6H PRN, Verlee RossettiSteven R Norris, MD; acetaminophen (TYLENOL) tablet 650 mg, 650 mg, Oral, Q6H PRN, Verlee RossettiSteven R Norris, MD; amLODipine (NORVASC) tablet 10 mg, 10 mg, Oral, Daily, Rodolph Bonganiel V Thompson, MD, 10 mg at 04/10/14 0955  atenolol (TENORMIN) tablet 100 mg, 100 mg, Oral, Daily, Rodolph Bonganiel V Thompson, MD, 100 mg at 04/10/14 [id number]; atorvastatin (LIPITOR) tablet 10 mg, 10 mg, Oral, q1800, Rodolph Bonganiel V Thompson, MD, 10 mg at 04/09/14 1649; ciprofloxacin (CIPRO) tablet 500 mg, 500 mg, Oral, BID, Jessica U Vann, DO, 500 mg at 04/10/14 1109; docusate sodium (COLACE) capsule 100 mg, 100 mg, Oral, BID, Rodolph Bonganiel V Thompson, MD, 100 mg at 04/10/14 0956  enoxaparin (LOVENOX) injection 30 mg, 30 mg, Subcutaneous, Q24H, Verlee RossettiSteven R Norris, MD, 30 mg at 04/10/14 [id number]; fenofibrate tablet 160 mg, 160 mg, Oral, Daily, Joseph ArtJessica U Vann, DO, 160 mg at 04/10/14 1220; HYDROcodone-acetaminophen (NORCO/VICODIN) 5-325 MG per tablet 1-2 tablet, 1-2 tablet, Oral, Q6H PRN, Rodolph Bonganiel V Thompson, MD, 1 tablet at 04/10/14 1110  insulin aspart (novoLOG) injection 0-15 Units, 0-15 Units, Subcutaneous, TID WC, Rodolph Bonganiel V Thompson, MD, 3 Units at 04/10/14 1142; insulin glargine (LANTUS) injection 20 Units, 20 Units, Subcutaneous, QHS, Jessica U Vann, DO; levothyroxine (SYNTHROID, LEVOTHROID) tablet 125 mcg, 125 mcg, Oral, QAC breakfast, Rodolph Bonganiel V Thompson,  MD, 125 mcg at 04/10/14 0735  lisinopril (PRINIVIL,ZESTRIL) tablet 40 mg, 40 mg, Oral, Daily, Rodolph Bong, MD, 40 mg at 04/10/14 0956; menthol-cetylpyridinium (CEPACOL) lozenge 3 mg, 1 lozenge, Oral, PRN, Verlee Rossetti, MD; metoCLOPramide (REGLAN) injection  5-10 mg, 5-10 mg, Intravenous, Q8H PRN, Verlee Rossetti, MD; metoCLOPramide (REGLAN) tablet 5-10 mg, 5-10 mg, Oral, Q8H PRN, Verlee Rossetti, MD  morphine 2 MG/ML injection 0.5 mg, 0.5 mg, Intravenous, Q2H PRN, Rodolph Bong, MD; morphine 2 MG/ML injection 0.5 mg, 0.5 mg, Intravenous, Q2H PRN, Verlee Rossetti, MD; ondansetron [facility]) injection 4 mg, 4 mg, Intravenous, Q6H PRN, Verlee Rossetti, MD; ondansetron [facility]) tablet 4 mg, 4 mg, Oral, Q6H PRN, Verlee Rossetti, MD, 4 mg at 04/09/14 0206  oxyCODONE (Oxy IR/ROXICODONE) immediate release tablet 5-10 mg, 5-10 mg, Oral, Q4H PRN, Verlee Rossetti, MD, 10 mg at 04/09/14 0617; pantoprazole (PROTONIX) EC tablet 40 mg, 40 mg, Oral, Daily, Rodolph Bong, MD, 40 mg at 04/10/14 0956; phenol (CHLORASEPTIC) mouth spray 1 spray, 1 spray, Mouth/Throat, PRN, Verlee Rossetti, MD; polyethylene glycol (MIRALAX / GLYCOLAX) packet 17 g, 17 g, Oral, Daily PRN, Rodolph Bong, MD  sorbitol 70 % solution 30 mL, 30 mL, Oral, Daily PRN, Rodolph Bong, MD  Patients Current Diet: Carb Control  Precautions / Restrictions  Precautions  Precautions: Posterior Hip;Fall  Precaution Booklet Issued: Yes (comment)  Precaution Comments: Reviewed precautions  Restrictions  Weight Bearing Restrictions: Yes  LLE Weight Bearing: Partial weight bearing  LLE Partial Weight Bearing Percentage or Pounds: 50%  Prior Activity Level  Community (5-7x/wk): 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  Current Functional Level  Cognition  Overall Cognitive Status: Within Functional Limits for tasks assessed  Orientation Level: Oriented X4   Extremity  Assessment  (includes Sensation/Coordination)     ADLs  Overall ADL's : Needs assistance/impaired  Eating/Feeding: Independent;Sitting  Grooming: Wash/dry hands;Wash/dry face;Brushing hair;Oral care;Set up;Sitting  Upper Body Bathing: Sitting;Minimal assitance  Lower Body Bathing: Total assistance;Sit to/from stand;Sitting/lateral leans  Upper Body Dressing : Minimal assistance;Sitting  Lower Body Dressing: Total assistance;Sitting/lateral leans;Sit to/from stand  Toilet Transfer: +2 for physical assistance;Moderate assistance;Stand-pivot;BSC  Toileting- Clothing Manipulation and Hygiene: Total assistance;Sit to/from stand  Functional mobility during ADLs: +2 for physical assistance;Moderate assistance  General ADL Comments: Began instruction in use of AE and DME. Instructed in hip precautions related to LB ADL.   Mobility  Overal bed mobility: Needs Assistance  Bed Mobility: Supine to Sit  Supine to sit: Min assist;HOB elevated;+2 for physical assistance  General bed mobility comments: Min assist with bed mobility for trunk control. HOB elevated, needs assist for scooting hips forward. Pt with posterior lean until at EOB. Verbal cues for technique and relies heavily on rail   Transfers  Overall transfer level: Needs assistance  Equipment used: Rolling walker (2 wheeled)  Transfers: Sit to/from Stand  Sit to Stand: Mod assist;+2 physical assistance;From elevated surface  Stand pivot transfers: +2 physical assistance;Mod assist  General transfer comment: Mod assist for sit<>stand with power up to lift. Pt needs verbal cues for hand placement and pulls on RW with opposite hand. Overall, improved ability from yesterday. Cues  for upright posture when in standing position and to bring hips forward. Requires extra time and leans heavily over walker initially. Sit<>stand attempted from bed and bsc.   Ambulation / Gait / Stairs / Wheelchair Mobility  Ambulation/Gait  Ambulation/Gait assistance: Mod  assist;+2 physical assistance  Ambulation Distance (Feet): 6 Feet  Assistive device: Rolling walker (2 wheeled)  Gait Pattern/deviations: Step-to pattern;Decreased step length - right;Decreased step length - left;Decreased stance time - left;Decreased weight shift to left;Antalgic  General Gait Details: Pt able to ambulate up to 6 feet today before needing a seated rest break. Improved ability to use UEs for support. Appears to maintain 50% WB status with knee immobilizer in place.   Posture / Balance  Dynamic Sitting Balance  Sitting balance - Comments: Sits EOB without assist, needs 1 UE for support   Special needs/care consideration  Bowel mgmt: no BM since admission  Bladder mgmt: urinary retention this am  Diabetic mgmt yes   Previous Home Environment  Living Arrangements: Children  Lives With: Daughter (daughter lives with [place] in [place]  Available Help at Discharge: Family;Available 24 hours/day  Type of Home: House  Home Layout: One level  Home Access: Stairs to enter  Entrance Stairs-Rails: Right  Entrance Stairs-Number of Steps: 3  Bathroom Shower/Tub: Merchant navy officerTub/shower unit  Bathroom Toilet: Standard  Bathroom Accessibility: Yes  How Accessible: Accessible via walker  Home Care Services: No  Additional Comments: family member that works for [employer] company provided, pt has not been educated in use  Discharge Living Setting  Plans for Discharge Living Setting: Patient's home;House  Type of Home at Discharge: House  Discharge Home Layout: One level  Discharge Home Access: Stairs to enter  Entrance Stairs-Rails: Right  Entrance Stairs-Number of Steps: 3  Discharge Bathroom Shower/Tub: Tub/shower unit  Discharge Bathroom Toilet: Standard  Discharge Bathroom Accessibility: Yes  How Accessible: Accessible via walker  Does the patient have any problems obtaining your medications?: No  Social/Family/Support Systems  Patient Roles: Parent;Other (Comment) (fulltime employee for [employer])  Contact Information: Marvell FullerJudy Epling, daughter  Anticipated Caregiver: daughters  Anticipated Caregiver's Contact Information: see above  Ability/Limitations of Caregiver: none  Caregiver Availability: 24/7  Discharge Plan Discussed with Primary Caregiver: Yes  Is Caregiver In Agreement with Plan?: Yes  Does Caregiver/Family have Issues with Lodging/Transportation while Pt is in Rehab?: No ([facility]  Goals/Additional Needs  Patient/Family Goal for Rehab: Mod I with PT and OT  Expected length of stay: ELOS 10 to 12 days  Pt/Family Agrees to Admission and willing to participate: Yes  Program Orientation Provided & Reviewed with Pt/Caregiver Including Roles & Responsibilities: Yes  Decrease burden of Care through IP rehab admission: n/a  Possible need for SNF placement upon discharge:no  Patient Condition: This patient's condition remains as documented in the consult dated 04/09/14, in which the Rehabilitation Physician determined and documented that the patient's condition is appropriate for intensive rehabilitative care in an inpatient rehabilitation facility. Will admit to inpatient rehab today.  Preadmission Screen Completed By: Clois DupesBarbara Godwin Deeanna Beightol, 04/10/2014 2:04 PM  ______________________________________________________________________  Discussed status with Dr. Riley KillSwartz on 04/10/14 at 1350 and received telephone approval for admission today.  Admission Coordinator: Clois DupesBarbara Godwin Peniel Biel, time 1350 Date 04/10/2014.  Cosigned by: Ranelle OysterZachary T Swartz, MD [04/10/2014 2:06 PM

## 2014-04-22 NOTE — Progress Notes (Signed)
[place] PHYSICAL MEDICINE & REHABILITATION     PROGRESS NOTE    Subjective/Complaints: A little anxious last night. No problems this am.   A 12 point review of systems has been performed and if not noted above is otherwise negative.    Objective: Vital Signs: Blood pressure 176/61, pulse 61, temperature 97.9 F (36.6 C), temperature source Oral, resp. rate 18, height 5\' 8"  (1.727 m), weight 84.4 kg (186 lb 1.1 oz), SpO2 97.00%. No results found. No results found for this basename: WBC, HGB, HCT, PLT,  in the last 72 hours No results found for this basename: NA, K, CL, CO, GLUCOSE, BUN, CREATININE, CALCIUM,  in the last 72 hours CBG (last 3)   Recent Labs  04/21/14 1656 04/21/14 2100 04/22/14 0719  GLUCAP 129* 145* 94    Wt Readings from Last 3 Encounters:  04/22/14 84.4 kg (186 lb 1.1 oz)  04/07/14 81.647 kg (180 lb)  04/07/14 81.647 kg (180 lb)    Physical Exam:  Constitutional: She is oriented to person, place, and time. She appears well-developed.  HENT: oral mucosa pink and moist. Dentition fair  Head: Normocephalic.  Eyes: EOM are normal.  Neck: Normal range of motion. Neck supple. No thyromegaly present.  Cardiovascular: Normal rate and regular rhythm. No murmurs, rubs, or gallops  Respiratory: Effort normal and breath sounds normal. No respiratory distress. No wheezes, rales, or rhonchi  GI: Soft. Bowel sounds are normal. She exhibits no distension. Non-tender  Skin:  Hip incision clean and dry, wound intact, staples out--allevyn over wound. 2 small, superficial stage 2 wounds near gluteal folds Psychiatric: She has a normal mood and affect. Very pleasant  Neuro: A and O x 3.  motor strength bilateral upper extremities 5/5 in the , bicep, tricep, grip, shoulders limited by RTC/OA and are approximately 3+ to 4-  Right lower extremity 4/5 in the hip flexor knee extensor ankle dorsiflexor plantar flexor  2-/5 left hip flexor, - KE, 4/5 left ankle dorsiflexor  plantar flexor.   Sensory intact to light touch in the upper and lower limbs. Cognitively intact. CN exam normal   Assessment/Plan: 1. Functional deficits secondary to left femoral neck fracture after fall s/p left hip hemiarthroplasty which require 3+ hours per day of interdisciplinary therapy in a comprehensive inpatient rehab setting. Physiatrist is providing close team supervision and 24 hour management of active medical problems listed below. Physiatrist and rehab team continue to assess barriers to discharge/monitor patient progress toward functional and medical goals. FIM: FIM - Bathing Bathing Steps Patient Completed: Chest;Right Arm;Left Arm;Abdomen;Buttocks;Front perineal area;Right upper leg;Left upper leg;Right lower leg (including foot);Left lower leg (including foot) Bathing: 5: Supervision: Safety issues/verbal cues  FIM - Upper Body Dressing/Undressing Upper body dressing/undressing steps patient completed: Thread/unthread right sleeve of pullover shirt/dresss;Thread/unthread left sleeve of pullover shirt/dress;Put head through opening of pull over shirt/dress;Pull shirt over trunk Upper body dressing/undressing: 5: Set-up assist to: Obtain clothing/put away FIM - Lower Body Dressing/Undressing Lower body dressing/undressing steps patient completed: Thread/unthread right pants leg;Thread/unthread left pants leg;Pull pants up/down;Fasten/unfasten pants;Don/Doff right sock;Don/Doff left sock;Don/Doff right shoe;Don/Doff left shoe Lower body dressing/undressing: 5: Supervision: Safety issues/verbal cues  FIM - Toileting Toileting steps completed by patient: Adjust clothing prior to toileting;Performs perineal hygiene;Adjust clothing after toileting Toileting Assistive Devices: Grab bar or rail for support Toileting: 5: Supervision: Safety issues/verbal cues  FIM - Diplomatic Services operational officerToilet Transfers Toilet Transfers Assistive Devices: Building control surveyorBedside commode;Walker Toilet Transfers: 5-To toilet/BSC:  Supervision (verbal cues/safety issues);5-From toilet/BSC: Supervision (verbal cues/safety issues)  FIM - Bed/Chair Transfer Bed/Chair Transfer Assistive Devices: Therapist, occupationalWalker Bed/Chair Transfer: 5: Bed > Chair or W/C: Supervision (verbal cues/safety issues);5: Chair or W/C > Bed: Supervision (verbal cues/safety issues)  FIM - Locomotion: Wheelchair Distance: 150 Locomotion: Wheelchair: 5: Travels 150 ft or more: maneuvers on rugs and over door sills with supervision, cueing or coaxing FIM - Locomotion: Ambulation Locomotion: Ambulation Assistive Devices: Designer, industrial/productWalker - Rolling Ambulation/Gait Assistance: 5: Supervision Locomotion: Ambulation: 1: Travels less than 50 ft with supervision/safety issues  Comprehension Comprehension Mode: Auditory Comprehension: 5-Understands basic 90% of the time/requires cueing < 10% of the time  Expression Expression Mode: Verbal Expression: 5-Expresses complex 90% of the time/cues < 10% of the time  Social Interaction Social Interaction: 7-Interacts appropriately with others - No medications needed.  Problem Solving Problem Solving: 5-Solves complex 90% of the time/cues < 10% of the time  Memory Memory: 5-Recognizes or recalls 90% of the time/requires cueing < 10% of the time  Medical Problem List and Plan:  1. Left femoral neck fracture after a fall. Status post left hip hemiarthroplasty 04/08/2014. Partial weightbearing with posterior hip precautions     2. DVT Prophylaxis/Anticoagulation: Subcutaneous Lovenox. Monitor platelet counts any signs of bleeding.   -dopplers negative 3. Pain Management: Oxycodone and Robaxin as needed with good control 4. Neuropsych: This patient is capable of making decisions on his own behalf.  5. Acute blood loss anemia. Followup CBC with improved hgb 6. Pseudomonas urinary tract infection. abx completed 7. Diabetes mellitus with peripheral neuropathy. Hemoglobin A1c 6.8. Lantus insulin 15 units each bedtime. Check blood  sugars a.c. and at bedtime   -good control  8. Hypertension. Norvasc 10 mg daily, Tenormin 100 mg daily, lisinopril 40 mg daily. Monitor with increased mobility  9. Hypothyroidism. Synthroid. Latest TSH level 4.400  10. Hyperlipidemia. Lipitor/ Lopid 11. Skin care--allevyn to gluts, pressure relief, avoid friction/sliding--should heal nicely   LOS (Days) 12 A FACE TO FACE EVALUATION WAS PERFORMED  Ranelle OysterZachary T Swartz 04/22/2014 8:20 AM

## 2014-04-22 NOTE — Progress Notes (Signed)
Social Work Patient ID: Theo Dills, female   DOB: 09-05-1925, 80 y.o.   MRN: [id number]  Met with pt and daughters following team conference.  All feeling they are ready for d/c Thursday AS LONG AS Worker's Comp has completed modifications needed on stairs and all DME and HH in place.  I am still awaiting confirmation from [facility] CM that these needs have been taken care off.  Planning toward d/c tomorrow.  Lennart Pall, LCSW

## 2014-04-22 NOTE — Patient Care Conference (Signed)
Inpatient RehabilitationTeam Conference and Plan of Care Update Date: 04/21/2014   Time: 2:05 PM    Patient Name: April GoresValerie A Snethen      Medical Record Number: [id number]  Date of Birth: 10/15/1925 Sex: Female         Room/Bed: 4W25C/4W25C-01 Payor Info: Payor: MEDICARE / Plan: MEDICARE PART A AND B / Product Type: *No Product type* /    Admitting Diagnosis: L FN Fx  Admit Date/Time:  04/10/2014  4:52 PM Admission Comments: No comment available   Primary Diagnosis:  <principal problem not specified> Principal Problem: <principal problem not specified>  Patient Active Problem List   Diagnosis Date Noted  . Hip fracture 04/10/2014  . Fracture of femoral neck, left 04/08/2014  . Left displaced femoral neck fracture 04/07/2014  . HTN (hypertension) 04/07/2014  . Hyperlipidemia 04/07/2014  . DM (diabetes mellitus) 04/07/2014  . Hypothyroidism 04/07/2014  . OA (osteoarthritis) 04/07/2014  . UTI (lower urinary tract infection) 04/07/2014  . Dehydration 04/07/2014    Expected Discharge Date: Expected Discharge Date: 04/23/14  Team Members Present: Physician leading conference: Dr. Faith RogueZachary Swartz Social Worker Present: Amada JupiterLucy Zhania Shaheen, LCSW Nurse Present: Carlean PurlMaryann Barbour, RN PT Present: Karolee StampsAlison Gray, Varney BilesPT;Audra Hall, PT OT Present: Donzetta KohutFrank Barthold, OT;Patricia Mat Carnelay, OT PPS Coordinator present : Edson SnowballBecky Windsor, Chapman FitchPT;Marie Noel, RN, CRRN     Current Status/Progress Goal Weekly Team Focus  Medical   some anxiety with activity , pain controlled, emptying bladder well  dm control, bladder mgt  finalize medically for dc   Bowel/Bladder   Continent of bowel and bladder, PVR dc'd 04/21/2014  Remain contient of bowel and bladder,  Monitor for adequate urine output and regular bowel patteren   Swallow/Nutrition/ Hydration             ADL's   Supervision for ADL  Overall Supervision for ADL  Memory, transfers, AE re-training, adherence to hip precautions, endurance, problem-solving, family  education   Mobility   S/min A overall; min A stairs  downgraded to overall S due to safety/decreased recall of precautions with functional mobility; min A stairs  bed mobility, stair negotiation, adherence to precautions/WB status, family education, d/c planning   Communication             Safety/Cognition/ Behavioral Observations            Pain   10mg  Oxy IR Q 4hr prn  <1 on a scale of 0-10  Moniior pain q2h, and administer medication to stay free of pain, and prn.   Skin   Dressing to the left hip, staples DC'd 04/21/2014, steristrips on wound, wound edges uneven, no drainage or bleeding coming from wound.   Remain free of infection and skin breakdown       Rehab Goals Patient on target to meet rehab goals: Yes *See Care Plan and progress notes for long and short-term goals.  Barriers to Discharge: anxiety, safety    Possible Resolutions to Barriers:  supervision at home    Discharge Planning/Teaching Needs:  home with family to provide 24/7 supervision      Team Discussion:  Ready for Thursday d/c.  Meeting supervision goals.  Need HHRN for wound monitor - will add.  Revisions to Treatment Plan:  None   Continued Need for Acute Rehabilitation Level of Care: The patient requires daily medical management by a physician with specialized training in physical medicine and rehabilitation for the following conditions: Daily direction of a multidisciplinary physical rehabilitation program to ensure safe treatment while eliciting  the highest outcome that is of practical value to the patient.: Yes Daily medical management of patient stability for increased activity during participation in an intensive rehabilitation regime.: Yes Daily analysis of laboratory values and/or radiology reports with any subsequent need for medication adjustment of medical intervention for : Post surgical problems;Other  Amada JupiterLucy Vallery Mcdade 04/22/2014, 9:02 AM

## 2014-04-22 NOTE — Progress Notes (Signed)
Physical Therapy Discharge Summary  Patient Details  Name: April [name] MRN: [id number] Date of Birth: [birth year]  Today's Date: 04/22/2014  Patient has met 9 of 9 long term goals due to improved activity tolerance, improved balance, increased strength, increased range of motion, decreased pain, ability to compensate for deficits and functional use of  left lower extremity.  Patient to discharge at an ambulatory level household distances with Supervision and S for w/c propulsion for longer distances.   Patient's daughters to provide the necessary physical and supervision/cueing assistance at discharge. Min A needed for stair negotiation otherwise pt is S. Daughters successfully completed family education.  Reasons goals not met: n/a - all goals met at this time.  Recommendation:  Patient will benefit from ongoing skilled PT services in home health setting to continue to advance safe functional mobility, address ongoing impairments in gait, balance, strength, endurance, ROM, maintaining hip precautions/WB restrictions, and minimize fall risk.  Equipment: Recommending RW and 18x18 w/c with basic cushion and legrests, leg lifter  Reasons for discharge: treatment goals met and discharge from hospital  Patient/family agrees with progress made and goals achieved: Yes  PT Discharge Precautions/Restrictions Precautions Precautions: Fall;Posterior Hip Precaution Booklet Issued: No Precaution Comments: Pt able to verbalize 3/3 precautions but intermittent cueing required during functional tasks to adhere. Restrictions Weight Bearing Restrictions: Yes LLE Weight Bearing: Partial weight bearing LLE Partial Weight Bearing Percentage or Pounds: 50%  Vision/Perception  Praxis Praxis: Intact  Cognition Overall Cognitive Status: Within Functional Limits for tasks assessed Memory: Impaired Memory Impairment: Decreased recall of new information (requires cues at times for adherence to hip  precautions) Safety/Judgment: Appears intact Sensation Sensation Light Touch: Appears Intact Coordination Gross Motor Movements are Fluid and Coordinated: Yes (LLE limited by pain) Motor  Motor Motor: Within Functional Limits   Trunk/Postural Assessment  Cervical Assessment Cervical Assessment: Within Functional Limits Thoracic Assessment Thoracic Assessment: Within Functional Limits Lumbar Assessment Lumbar Assessment: Within Functional Limits Postural Control Postural Control: Within Functional Limits  Balance Balance Balance Assessed: Yes Static Sitting Balance Static Sitting - Level of Assistance: 7: Independent Dynamic Sitting Balance Dynamic Sitting - Level of Assistance: 6: Modified independent (Device/Increase time) Static Standing Balance Static Standing - Level of Assistance: 6: Modified independent (Device/Increase time);5: Stand by assistance Dynamic Standing Balance Dynamic Standing - Level of Assistance: 5: Stand by assistance Extremity Assessment  RLE Assessment RLE Assessment: Exceptions to [facility] RLE Strength RLE Overall Strength Comments: grossly 3+ to 4-/5 LLE Assessment LLE Assessment: Exceptions to [facility] LLE Strength LLE Overall Strength Comments: hip 3/5 grossly, knee and ankle [facility]  See FIM for current functional status  Lars Masson 04/22/2014, 4:11 PM

## 2014-04-22 NOTE — Progress Notes (Signed)
Physical Therapy Session Note  Patient Details  Name: April [name] MRN: [id number] Date of Birth: 04/08/1925  Today's Date: 04/22/2014  Short Term Goals: Week 2:  PT Short Term Goal 1 (Week 2): = LTGs  Session #1 Time: 835-930 (55 min)   Premedicated for pain in L hip. Gait with RW for functional mobility training with overall S x 65' x 2 (beginning and then at end of session). Stair negotiation for simulated home entry with bilateral rails with S and steady A with 1 rail - pt able to recall sequence without cueing needed today. W/c propulsion on unit for general UE strengthening and endurance with S. Practiced household gait and bed mobility in ADL apartment using leg lifter which pt able to do at a mod I level today with leg lifter maintaining precautions without cueing needed from therapist. Assisted pt with toileting at end of session S level for safety using RW to gait in and out of bathroom. Discussed d/c planning including follow up therapies to which pt verbalized understanding.   Session #2: Time: 1300-1341 (41 min) Pain in LLE - already had medication. Session focused on family education with pt and pt's 2 daughters (one who will be primary caregiver and the other is in temporarily from out of town) in regards to hip precautions, gait with RW, simulated car transfer, and stair negotiation with bilateral rails and 1 rail on R. Pt's family able to return demonstrate correct cueing and physical A safely for stairs. Pt and family deny further questions. State the stairs have not yet been modified but the company was supposed to come out today.   Therapy Documentation Precautions:  Precautions Precautions: Fall;Posterior Hip Precaution Booklet Issued: Yes (comment) Precaution Comments: Reviewed precautions Required Braces or Orthoses:  ( ) Restrictions Weight Bearing Restrictions: Yes LLE Weight Bearing: Partial weight bearing LLE Partial Weight Bearing Percentage or Pounds:  50%  Pain: Premedicated for pain.  See FIM for current functional status  Therapy/Group: Individual Therapy  [name] 04/22/2014, 8:21 AM

## 2014-04-22 NOTE — Discharge Instructions (Signed)
Inpatient Rehab Discharge Instructions  April GoresValerie A [name] Discharge date and time: No discharge date for patient encounter.   Activities/Precautions/ Functional Status: Activity: partial weight bearing left leg with posterior hip precautions Diet: diabetic diet Wound Care: keep wound clean and dry Functional status:  ___ No restrictions     ___ Walk up steps independently ___ 24/7 supervision/assistance   ___ Walk up steps with assistance ___ Intermittent supervision/assistance  ___ Bathe/dress independently ___ Walk with walker     ___ Bathe/dress with assistance _x__ Walk Independently    ___ Shower independently ___ Walk with assistance    ___ Shower with assistance ___ No alcohol     ___ Return to work/school ________    COMMUNITY REFERRALS UPON DISCHARGE:    Home Health:   PT    OT    RN                    Agency:                       Phone:   Medical Equipment/Items Ordered: wheelchair, cushion, walker, 3n1 commode and tub bench                                                     Agency/Supplier: [facility] @ [phone]       Special Instructions:    My questions have been answered and I understand these instructions. I will adhere to these goals and the provided educational materials after my discharge from the hospital.  Patient/Caregiver Signature _______________________________ Date __________  Clinician Signature _______________________________________ Date __________  Please bring this form and your medication list with you to all your follow-up doctor's appointments.

## 2014-04-22 NOTE — Progress Notes (Signed)
Social Work Patient ID: April GoresValerie A Goerner, female   DOB: 10/16/1925, 80 y.o.   MRN: [id number]  Amada JupiterLucy Dollye Glasser, LCSW Social Worker Signed  Patient Care Conference Service date: 04/22/2014 9:02 AM  Inpatient RehabilitationTeam Conference and Plan of Care Update Date: 04/21/2014   Time: 2:05 PM     Patient Name: April [name]       Medical Record Number: [id number]   Date of Birth: 09/20/1925 Sex: Female         Room/Bed: 4W25C/4W25C-01 Payor Info: Payor: MEDICARE / Plan: MEDICARE PART A AND B / Product Type: *No Product type* /   Admitting Diagnosis: L FN Fx   Admit Date/Time:  04/10/2014  4:52 PM Admission Comments: No comment available   Primary Diagnosis:  <principal problem not specified> Principal Problem: <principal problem not specified>    Patient Active Problem List     Diagnosis  Date Noted   .  Hip fracture  04/10/2014   .  Fracture of femoral neck, left  04/08/2014   .  Left displaced femoral neck fracture  04/07/2014   .  HTN (hypertension)  04/07/2014   .  Hyperlipidemia  04/07/2014   .  DM (diabetes mellitus)  04/07/2014   .  Hypothyroidism  04/07/2014   .  OA (osteoarthritis)  04/07/2014   .  UTI (lower urinary tract infection)  04/07/2014   .  Dehydration  04/07/2014     Expected Discharge Date: Expected Discharge Date: 04/23/14  Team Members Present: Physician leading conference: Dr. Faith RogueZachary Swartz Social Worker Present: Amada JupiterLucy Gene Glazebrook, LCSW Nurse Present: Carlean PurlMaryann Barbour, RN PT Present: Karolee StampsAlison Gray, Varney BilesPT;Audra Hall, PT OT Present: Donzetta KohutFrank Barthold, OT;Patricia Mat Carnelay, OT PPS Coordinator present : Edson SnowballBecky Windsor, Chapman FitchPT;Marie Noel, RN, CRRN        Current Status/Progress  Goal  Weekly Team Focus   Medical     some anxiety with activity , pain controlled, emptying bladder well  dm control, bladder mgt  finalize medically for dc   Bowel/Bladder     Continent of bowel and bladder, PVR dc'd 04/21/2014  Remain contient of bowel and bladder,  Monitor for adequate urine  output and regular bowel patteren   Swallow/Nutrition/ Hydration            ADL's     Supervision for ADL  Overall Supervision for ADL  Memory, transfers, AE re-training, adherence to hip precautions, endurance, problem-solving, family education   Mobility     S/min A overall; min A stairs  downgraded to overall S due to safety/decreased recall of precautions with functional mobility; min A stairs  bed mobility, stair negotiation, adherence to precautions/WB status, family education, d/c planning   Communication            Safety/Cognition/ Behavioral Observations           Pain     10mg  Oxy IR Q 4hr prn  <1 on a scale of 0-10  Moniior pain q2h, and administer medication to stay free of pain, and prn.   Skin     Dressing to the left hip, staples DC'd 04/21/2014, steristrips on wound, wound edges uneven, no drainage or bleeding coming from wound.   Remain free of infection and skin breakdown      Rehab Goals Patient on target to meet rehab goals: Yes *See Care Plan and progress notes for long and short-term goals.    Barriers to Discharge:  anxiety, safety      Possible Resolutions to Barriers:  supervision at home      Discharge Planning/Teaching Needs:    home with family to provide 24/7 supervision      Team Discussion:    Ready for Thursday d/c.  Meeting supervision goals.  Need HHRN for wound monitor - will add.   Revisions to Treatment Plan:    None    Continued Need for Acute Rehabilitation Level of Care: The patient requires daily medical management by a physician with specialized training in physical medicine and rehabilitation for the following conditions: Daily direction of a multidisciplinary physical rehabilitation program to ensure safe treatment while eliciting the highest outcome that is of practical value to the patient.: Yes Daily medical management of patient stability for increased activity during participation in an intensive rehabilitation regime.:  Yes Daily analysis of laboratory values and/or radiology reports with any subsequent need for medication adjustment of medical intervention for : Post surgical problems;Other  Amada JupiterLucy Lilu Mcglown 04/22/2014, 9:02 AM

## 2014-04-22 NOTE — Progress Notes (Signed)
Social Work Patient ID: April [name], female   DOB: 06/28/1925, 80 y.o.   MRN: [id number]  Have spoken several times today with Cheryl SwazilandJordan, [facility] CM for updates on DME, HH, home mods, etc.  At this point, we have been approved to utilize [facility] for DME and order has been placed with request that DME be delivered to room prior to 10:30 am tomorrow.  Ms. SwazilandJordan confirms that modifications to home entry steps are to be completed before 5 pm tomorrow.  Daughters wish to d/c in the morning and have arranged for pt's son to be at the home to help them get pt inside.  I have [facility] providing [facility], however, no confirmed agency yet.  Have requested that Staci AcostaJenny Prevatt, LCSW follow up with this on my behalf tomorrow in my absence.  Amada JupiterLucy Delton Stelle, LCSW

## 2014-04-22 NOTE — Discharge Summary (Signed)
Discharge summary job [id number]

## 2014-04-22 NOTE — Progress Notes (Addendum)
Social Work Discharge Note  The overall goal for the admission was met for:   Discharge location: Yes - home with family to provide 24/7 assistance  Length of Stay: Yes - 13 days  Discharge activity level: Yes - supervision overall  Home/community participation: Yes  Services provided included: MD, RN, PT, OT, TR, SW, RD  Financial Services: Worker's Comp  Follow-up services arranged: Home Health: RN, PT, OT being arranged via Worker's Comp (agency TBD - will inform pt prior to leaving), DME: 18x18 lightweight w/c, cushion, rolling walker, 3n1 commode and tub bench via [place] and Patient/Family has no preference for HH/DME agencies  Comments (or additional information):  Patient/Family verbalized understanding of follow-up arrangements: Yes  Individual responsible for coordination of the follow-up plan: patient  Confirmed correct DME delivered: Lennart Pall 04/22/2014    Lennart Pall

## 2014-04-22 NOTE — Progress Notes (Signed)
Occupational Therapy Session Note  Patient Details  Name: April [name] MRN: [id number] Date of Birth: 01/05/1925  Today's Date: 04/22/2014 Time: 1100-1157 Time Calculation (min): 57 min  Short Term Goals: Week 2:  OT Short Term Goal 1 (Week 2): STG=LTG due to brief LOS remaining  Skilled Therapeutic Interventions/Progress Updates: ADL-retraining with focus on improved independence with functional mobility, transfers, adapted bathing/dressing using AE, and endurance.   Patient received in w/c, receptive for bathing/dressing but requesting to toilet first.  After setup for ADL, patient was challenged to perform tasks as if no supervision or additional assistance was available.   Patient rose from w/c to RW and ambulated to bathroom.   [facility].  Patient toileted, removing clothing using reacher provided, and then rose to RW to transfer to tub bench.   Patient bathed using LH sponge and returned to room to dress while sitting at edge of bed with tools and supplies set up.   Patient was aware of tools present but was unable coordinate exchanging devices as needed to complete task w/o verbal cues for problem-solving.   Patient completed dressing without physical assist but remains dependent upon therapist to provide direction when needed.   Patient recovered to bed from sitting to supine with use of leg lifter only (bed rails were down).    Patient then requested to return to her w/c for setup with lunch as her daughters arrived to visit.    Therapy Documentation Precautions:  Precautions Precautions: Fall;Posterior Hip Precaution Booklet Issued: Yes (comment) Precaution Comments: Reviewed precautions Required Braces or Orthoses:  ( ) Restrictions Weight Bearing Restrictions: Yes LLE Weight Bearing: Partial weight bearing LLE Partial Weight Bearing Percentage or Pounds: 50  Pain: Pain Assessment Pain Assessment: 0-10 Pain  Score: 6  Pain Type: Surgical pain Pain Location: Hip Pain Orientation: Left Pain Descriptors / Indicators: Aching Pain Frequency: Intermittent Pain Onset: On-going Patients Stated Pain Goal: 2 Pain Intervention(s): Medication (See eMAR)  See FIM for current functional status  Therapy/Group: Individual Therapy  Second session: Time:1430-1500 Time Calculation (min):  30 min  Pain Assessment: No/denies pain  Skilled Therapeutic Interventions: Therapeutic activity with focus on kitchen tasks using RW, functional mobility, home safety, and discharge planning.   Patient completed simulated preparation of grilled cheese sandwich, using folded paper towels as bread.   Patient retrieved items, used countertops and cart to transport items, and accessed drawers and appliances with only supervision assist for safety.   Patient reports mild discomfort at left hip after prolonged sitting but manages RW well during assigned tasks, w/o evidence of LOB throughout session.   Patient reports that she will refrain from meal prep, allowing her daughter to assist until her endurance improves some time after discharge.   Both daughters were present at end of session and reported no further need for education on patient's limitations as they have observed and interacted with rehab staff (OT/PT) throughout patient's admission and treatments.  See FIM for current functional status  Therapy/Group: Individual Therapy  April [name] 04/22/2014, 12:00 PM

## 2014-04-22 NOTE — Discharge Summary (Signed)
NAMMichel [name]:  [name], April            ACCOUNT NO.:  [id number]  MEDICAL RECORD NO.:  [id number]  LOCATION:  [id number]                        FACILITY:  [facility]  PHYSICIAN:  April [name], M.D.DATE OF BIRTH:  12/30/1924  DATE OF ADMISSION:  04/10/2014 DATE OF DISCHARGE:  04/23/2014                              DISCHARGE SUMMARY   DISCHARGE DIAGNOSES: 1. Left femoral neck fracture after a fall, status post left hip     hemiarthroplasty. 2. Subcutaneous Lovenox for deep venous thrombosis prophylaxis. 3. Pain management. 4. Acute blood loss anemia. 5. Pseudomonas urinary tract infection. 6. Diabetes mellitus with peripheral neuropathy. 7. Hypertension. 8. Hypothyroidism. 9. Hyperlipidemia.  HISTORY OF PRESENT ILLNESS:  This is an 80 year old right-handed female. The patient lives with her daughter and was independent prior to admission.  Presented on April 07, 2014, after a fall on a wet floor while at work with [employer].  There was no loss of consciousness.  X-rays and imaging revealed a displaced left femoral neck fracture.  Underwent left hip hemiarthroplasty on April 08, 2014, per Dr. Ranell [name].  Postoperative pain management.  Partial weightbearing with posterior hip precautions.  Placed on subcutaneous Lovenox for DVT prophylaxis.  Acute blood loss anemia 10.3 and monitored.  Urine culture greater than 100,000 Pseudomonas, maintained on Rocephin, changed to Cipro.  Physical and occupational therapy ongoing.  The patient was admitted for comprehensive rehab program.  PAST MEDICAL HISTORY:  See discharge diagnoses.  SOCIAL HISTORY:  Lives with daughter.  FUNCTIONAL HISTORY:  Prior to admission, independent.  FUNCTIONAL STATUS:  Upon admission to rehab services was moderate assist for basic mobility.  PHYSICAL EXAMINATION:  VITAL SIGNS:  Blood pressure 133/41, pulse 62, temperature 97, respirations 18. GENERAL:  This was an alert female, oriented x3. HEENT:   Pupils round and reactive to light. LUNGS:  Clear to auscultation. CARDIAC:  Regular rate and rhythm. ABDOMEN:  Soft, nontender.  Good bowel sounds. SKIN:  Hip incision clean and dry with staples intact.  REHABILITATION HOSPITAL COURSE:  The patient was admitted to inpatient rehab services with therapies initiated on a 3-hour daily basis consisting of physical therapy, occupational therapy, and rehabilitation nursing.  The following issues were addressed during the patient's rehabilitation stay.  Pertaining to the patient's left femoral neck fracture after a fall, she had undergone left hip hemiarthroplasty on April 08, 2014, per Dr. Ranell [name].  Partial weightbearing with posterior hip precautions.  Neurovascular sensation intact.  Subcutaneous Lovenox for DVT prophylaxis.  Venous Doppler studies negative.  Pain management with the use of oxycodone and Robaxin with good results.  Acute blood loss anemia, with no bleeding episodes.  Latest hemoglobin of 10.4.  She had completed a course of Cipro for a Pseudomonas urinary tract infection.  She did have a history of diabetes mellitus with peripheral neuropathy.  Hemoglobin A1c of 6.8.  She had been on Lantus insulin during her hospital stay.  Blood pressure is well controlled.  No orthostatic changes.  She remained on Synthroid for hypothyroidism.  The patient received weekly collaborative interdisciplinary team conferences to discuss estimated length of stay, family teaching, and any barriers to discharge.  The patient required supervision for toileting and ambulation in her room  with a rolling walker following her posterior hip precautions.  Activities of daily living, focused on retraining with improved skills, adherence to hip precautions.  Completed functional mobility using a rolling walker and supervision.  She was able to gather her belongings for bathing and dressing.  Full family teaching was completed.  Plan was for ongoing  therapies at the time of discharge per rehab services.  DISCHARGE MEDICATIONS: 1. Norvasc 10 mg p.o. daily. 2. Tenormin 100 mg p.o. daily. 3. Lipitor 10 mg p.o. daily. 4. Colace 100 mg p.o. b.i.d. 5. Fenofibrate 160 mg p.o. daily. 6. Lantus insulin 20 units subcutaneously at bedtime. 7. Synthroid 125 mcg p.o. daily. 8. Lisinopril 40 mg p.o. daily. 9. Oxycodone immediate release 5-10 mg every 4 hours as needed for     pain, dispense of 90 tablets. 10.OxyContin sustained release 10 mg p.o. every 12 hours x2 weeks and     stop. 11.Protonix 40 mg p.o. daily.  DIET:  Diabetic diet.  SPECIAL INSTRUCTIONS:  The patient would follow up with Dr. Faith RogueZachary [name] at the outpatient rehab service office as needed; Dr. Malon KindleSteven [name], Orthopedic Services in 2 weeks, call for appointment; Dr. Quintin AltoSteven [name], medical management.  Ongoing therapy arranged as per rehab services.     April Dollaraniel Baylen [name], P.A.   ______________________________ April [name], M.D.    DA/MEDQ  D:  04/22/2014  T:  04/22/2014  Job:  [id number]  cc:   April [name], M.D. April AltoSteven Burdine, MD

## 2014-04-23 LAB — GLUCOSE, CAPILLARY
Glucose-Capillary: 113 mg/dL — ABNORMAL HIGH (ref 70–99)
Glucose-Capillary: 122 mg/dL — ABNORMAL HIGH (ref 70–99)

## 2014-04-23 MED ORDER — OXYCODONE HCL ER 10 MG PO T12A: 10.0000 mg | EXTENDED_RELEASE_TABLET | Freq: Two times a day (BID) | ORAL | Status: DC

## 2014-04-23 MED ORDER — ATENOLOL 100 MG PO TABS: 100.0000 mg | ORAL_TABLET | Freq: Every day | ORAL | Status: AC

## 2014-04-23 MED ORDER — PANTOPRAZOLE SODIUM 40 MG PO TBEC: 40.0000 mg | DELAYED_RELEASE_TABLET | Freq: Every day | ORAL | Status: DC

## 2014-04-23 MED ORDER — LEVOTHYROXINE SODIUM 125 MCG PO TABS: 125.0000 ug | ORAL_TABLET | Freq: Every day | ORAL | Status: AC

## 2014-04-23 MED ORDER — AMLODIPINE BESYLATE 10 MG PO TABS: 10.0000 mg | ORAL_TABLET | Freq: Every day | ORAL | Status: DC

## 2014-04-23 MED ORDER — OXYCODONE HCL 5 MG PO TABS: 5.0000 mg | ORAL_TABLET | ORAL | Status: DC | PRN

## 2014-04-23 MED ORDER — LISINOPRIL 40 MG PO TABS: 40.0000 mg | ORAL_TABLET | Freq: Every day | ORAL | Status: AC

## 2014-04-23 MED ORDER — GEMFIBROZIL 600 MG PO TABS: 600.0000 mg | ORAL_TABLET | Freq: Two times a day (BID) | ORAL | Status: DC

## 2014-04-23 MED ORDER — INSULIN GLARGINE 100 UNIT/ML ~~LOC~~ SOLN: 20.0000 [IU] | Freq: Every day | SUBCUTANEOUS | Status: DC

## 2014-04-23 NOTE — Progress Notes (Signed)
Patient discharged to home accompanied by her daughters.

## 2014-04-23 NOTE — Progress Notes (Signed)
[place] PHYSICAL MEDICINE & REHABILITATION     PROGRESS NOTE    Subjective/Complaints: No new complaints. Excited to go home.  A 12 point review of systems has been performed and if not noted above is otherwise negative.    Objective: Vital Signs: Blood pressure 147/61, pulse 65, temperature 97.9 F (36.6 C), temperature source Oral, resp. rate 20, height 5\' 8"  (1.727 m), weight 84.4 kg (186 lb 1.1 oz), SpO2 96.00%. No results found. No results found for this basename: WBC, HGB, HCT, PLT,  in the last 72 hours No results found for this basename: NA, K, CL, CO, GLUCOSE, BUN, CREATININE, CALCIUM,  in the last 72 hours CBG (last 3)   Recent Labs  04/22/14 1639 04/22/14 2109 04/23/14 0733  GLUCAP 88 170* 113*    Wt Readings from Last 3 Encounters:  04/22/14 84.4 kg (186 lb 1.1 oz)  04/07/14 81.647 kg (180 lb)  04/07/14 81.647 kg (180 lb)    Physical Exam:  Constitutional: She is oriented to person, place, and time. She appears well-developed.  HENT: oral mucosa pink and moist. Dentition fair  Head: Normocephalic.  Eyes: EOM are normal.  Neck: Normal range of motion. Neck supple. No thyromegaly present.  Cardiovascular: Normal rate and regular rhythm. No murmurs, rubs, or gallops  Respiratory: Effort normal and breath sounds normal. No respiratory distress. No wheezes, rales, or rhonchi  GI: Soft. Bowel sounds are normal. She exhibits no distension. Non-tender  Skin:  Hip incision clean and dry, wound intact, staples out--allevyn over wound. 2 small, superficial stage 2 wounds near gluteal folds Psychiatric: She has a normal mood and affect. Very pleasant  Neuro: A and O x 3.  motor strength bilateral upper extremities 5/5 in the , bicep, tricep, grip, shoulders limited by RTC/OA and are approximately 3+ to 4-  Right lower extremity 4/5 in the hip flexor knee extensor ankle dorsiflexor plantar flexor  2-/5 left hip flexor, - KE, 4/5 left ankle dorsiflexor plantar  flexor.   Sensory intact to light touch in the upper and lower limbs. Cognitively intact. CN exam normal   Assessment/Plan: 1. Functional deficits secondary to left femoral neck fracture after fall s/p left hip hemiarthroplasty which require 3+ hours per day of interdisciplinary therapy in a comprehensive inpatient rehab setting. Physiatrist is providing close team supervision and 24 hour management of active medical problems listed below. Physiatrist and rehab team continue to assess barriers to discharge/monitor patient progress toward functional and medical goals.  Home today. Follow up with ortho/pcp===can see me prn.   FIM: FIM - Bathing Bathing Steps Patient Completed: Chest;Right Arm;Left Arm;Abdomen;Front perineal area;Buttocks;Right upper leg;Left upper leg;Right lower leg (including foot);Left lower leg (including foot) Bathing: 6: Assistive device (Comment) (LH sponge)  FIM - Upper Body Dressing/Undressing Upper body dressing/undressing steps patient completed: Thread/unthread right sleeve of pullover shirt/dresss;Thread/unthread left sleeve of pullover shirt/dress;Put head through opening of pull over shirt/dress;Pull shirt over trunk Upper body dressing/undressing: 5: Set-up assist to: Obtain clothing/put away FIM - Lower Body Dressing/Undressing Lower body dressing/undressing steps patient completed: Thread/unthread right pants leg;Thread/unthread left pants leg;Pull pants up/down;Don/Doff right sock;Don/Doff left sock;Don/Doff right shoe;Don/Doff left shoe Lower body dressing/undressing: 5: Set-up assist to: Obtain clothing  FIM - Toileting Toileting steps completed by patient: Adjust clothing prior to toileting Toileting Assistive Devices: Grab bar or rail for support Toileting: 5: Supervision: Safety issues/verbal cues  FIM - Diplomatic Services operational officerToilet Transfers Toilet Transfers Assistive Devices: Walker;Elevated toilet seat Toilet Transfers: 5-To toilet/BSC: Supervision (verbal  cues/safety issues);5-From  toilet/BSC: Supervision (verbal cues/safety issues)  FIM - BankerBed/Chair Transfer Bed/Chair Transfer Assistive Devices: Therapist, occupationalWalker Bed/Chair Transfer: 6: Supine > Sit: No assist;5: Bed > Chair or W/C: Supervision (verbal cues/safety issues);5: Chair or W/C > Bed: Supervision (verbal cues/safety issues);6: Sit > Supine: No assist  FIM - Locomotion: Wheelchair Distance: 150 Locomotion: Wheelchair: 5: Travels 150 ft or more: maneuvers on rugs and over door sills with supervision, cueing or coaxing FIM - Locomotion: Ambulation Locomotion: Ambulation Assistive Devices: Designer, industrial/productWalker - Rolling Ambulation/Gait Assistance: 5: Supervision Locomotion: Ambulation: 2: Travels 50 - 149 ft with supervision/safety issues  Comprehension Comprehension Mode: Auditory Comprehension: 5-Understands basic 90% of the time/requires cueing < 10% of the time  Expression Expression Mode: Verbal Expression: 5-Expresses complex 90% of the time/cues < 10% of the time  Social Interaction Social Interaction: 7-Interacts appropriately with others - No medications needed.  Problem Solving Problem Solving: 5-Solves complex 90% of the time/cues < 10% of the time  Memory Memory: 5-Recognizes or recalls 90% of the time/requires cueing < 10% of the time  Medical Problem List and Plan:  1. Left femoral neck fracture after a fall. Status post left hip hemiarthroplasty 04/08/2014. Partial weightbearing with posterior hip precautions     2. DVT Prophylaxis/Anticoagulation: Subcutaneous Lovenox. Monitor platelet counts any signs of bleeding.   -dopplers negative 3. Pain Management: Oxycodone and Robaxin as needed with good control 4. Neuropsych: This patient is capable of making decisions on his own behalf.  5. Acute blood loss anemia. Followup CBC with improved hgb 6. Pseudomonas urinary tract infection. abx completed 7. Diabetes mellitus with peripheral neuropathy. Hemoglobin A1c 6.8. Lantus insulin 15  units each bedtime. Check blood sugars a.c. and at bedtime   -good control  8. Hypertension. Norvasc 10 mg daily, Tenormin 100 mg daily, lisinopril 40 mg daily. Monitor with increased mobility  9. Hypothyroidism. Synthroid. Latest TSH level 4.400  10. Hyperlipidemia. Lipitor/ Lopid 11. Skin care--allevyn to gluts, pressure relief, avoid friction/sliding--should heal nicely   LOS (Days) 13 A FACE TO FACE EVALUATION WAS PERFORMED  Ranelle OysterZachary T Swartz 04/23/2014 8:18 AM
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
## 2014-07-16 ENCOUNTER — Other Ambulatory Visit: Payer: Self-pay | Admitting: Neurosurgery

## 2014-07-29 ENCOUNTER — Encounter (HOSPITAL_COMMUNITY): Payer: Self-pay

## 2014-07-29 ENCOUNTER — Encounter (HOSPITAL_COMMUNITY): Payer: Self-pay | Admitting: Pharmacy Technician

## 2014-07-29 ENCOUNTER — Encounter (HOSPITAL_COMMUNITY)
Admission: RE | Admit: 2014-07-29 | Discharge: 2014-07-29 | Disposition: A | Discharge status: Home / Self Care | Payer: Worker's Compensation | Source: Ambulatory Visit | Attending: Neurosurgery | Admitting: Neurosurgery

## 2014-07-29 DIAGNOSIS — Z01812 Encounter for preprocedural laboratory examination: Secondary | ICD-10-CM | POA: Insufficient documentation

## 2014-07-29 DIAGNOSIS — Z01818 Encounter for other preprocedural examination: Secondary | ICD-10-CM | POA: Insufficient documentation

## 2014-07-29 HISTORY — DX: Personal history of other infectious and parasitic diseases: Z86.19

## 2014-07-29 HISTORY — DX: Dorsalgia, unspecified: M54.9

## 2014-07-29 HISTORY — DX: Urinary tract infection, site not specified: N39.0

## 2014-07-29 HISTORY — DX: Gastro-esophageal reflux disease without esophagitis: K21.9

## 2014-07-29 HISTORY — DX: Nocturia: R35.1

## 2014-07-29 HISTORY — DX: Systemic lupus erythematosus, unspecified: M32.9

## 2014-07-29 HISTORY — DX: Reserved for concepts with insufficient information to code with codable children: IMO0002

## 2014-07-29 HISTORY — DX: Frequency of micturition: R35.0

## 2014-07-29 HISTORY — DX: Localized edema: R60.0

## 2014-07-29 HISTORY — DX: Other chronic pain: G89.29

## 2014-07-29 HISTORY — DX: Constipation, unspecified: K59.00

## 2014-07-29 HISTORY — DX: Pain in unspecified joint: M25.50

## 2014-07-29 HISTORY — DX: Unspecified cataract: H26.9

## 2014-07-29 HISTORY — DX: Personal history of other diseases of the respiratory system: Z87.09

## 2014-07-29 HISTORY — DX: Edema, unspecified: R60.9

## 2014-07-29 LAB — BASIC METABOLIC PANEL
Anion gap: 12 (ref 5–15)
BUN: 19 mg/dL (ref 6–23)
CHLORIDE: 105 meq/L (ref 96–112)
CO2: 24 meq/L (ref 19–32)
CREATININE: 0.9 mg/dL (ref 0.50–1.10)
Calcium: 9 mg/dL (ref 8.4–10.5)
GFR calc non Af Amer: 56 mL/min — ABNORMAL LOW (ref >90)
GFR, EST AFRICAN AMERICAN: 65 mL/min — AB (ref >90)
Glucose, Bld: 149 mg/dL — ABNORMAL HIGH (ref 70–99)
Potassium: 4.1 mEq/L (ref 3.7–5.3)
Sodium: 141 mEq/L (ref 137–147)

## 2014-07-29 LAB — CBC
HCT: 38.7 % (ref 36.0–46.0)
HEMOGLOBIN: 12.4 g/dL (ref 12.0–15.0)
MCH: 31.7 pg (ref 26.0–34.0)
MCHC: 32 g/dL (ref 30.0–36.0)
MCV: 99 fL (ref 78.0–100.0)
Platelets: 181 10*3/uL (ref 150–400)
RBC: 3.91 MIL/uL (ref 3.87–5.11)
RDW: 15 % (ref 11.5–15.5)
WBC: 8.3 10*3/uL (ref 4.0–10.5)

## 2014-07-29 LAB — SURGICAL PCR SCREEN
MRSA, PCR: POSITIVE — AB
Staphylococcus aureus: POSITIVE — AB

## 2014-07-29 LAB — TYPE AND SCREEN
ABO/RH(D): O POS
ANTIBODY SCREEN: NEGATIVE

## 2014-07-29 NOTE — Progress Notes (Signed)
Fasting blood sugar runs around 100 but can't tell when sugar is dropping

## 2014-07-29 NOTE — Pre-Procedure Instructions (Signed)
April GoresValerie A Mitton  07/29/2014   Your procedure is scheduled on:  Wed, Aug 19 @ 9:30 AM  [facility]  at 7:30 AM.  Call this number if you have problems the morning of surgery: [phone]   Remember:   Do not eat food or drink liquids after midnight.   Take these medicines the morning of surgery with A SIP OF WATER: Amlodipine(Norvasc),Atenolol(Tenormin),Synthroid(Levothyroxine),Pain Pill(if needed),and Pantoprazole(Protonix)                No Goody's,BC's,Aleve,Aspirin,Ibuprofen,Fish Oil,or any Herbal Medications   Do not wear jewelry, make-up or nail polish.  Do not wear lotions, powders, or perfumes. You may wear deodorant.  Do not shave 48 hours prior to surgery.   Do not bring valuables to the hospital.  [facility] not responsible                  for any belongings or valuables.               Contacts, dentures or bridgework may not be worn into surgery.  Leave suitcase in the car. After surgery it may be brought to your room.  For patients admitted to the hospital, discharge time is determined by your                treatment team.                Special Instructions:   - Preparing for Surgery  Before surgery, you can play an important role.  Because skin is not sterile, your skin needs to be as free of germs as possible.  You can reduce the number of germs on you skin by washing with CHG (chlorahexidine gluconate) soap before surgery.  CHG is an antiseptic cleaner which kills germs and bonds with the skin to continue killing germs even after washing.  Please DO NOT use if you have an allergy to CHG or antibacterial soaps.  If your skin becomes reddened/irritated stop using the CHG and inform your nurse when you arrive at Short Stay.  Do not shave (including legs and underarms) for at least 48 hours prior to the first CHG shower.  You may shave your face.  Please follow these instructions carefully:   1.  Shower with CHG Soap the night  before surgery and the                                morning of Surgery.  2.  If you choose to wash your hair, wash your hair first as usual with your       normal shampoo.  3.  After you shampoo, rinse your hair and body thoroughly to remove the                      Shampoo.  4.  Use CHG as you would any other liquid soap.  You can apply chg directly       to the skin and wash gently with scrungie or a clean washcloth.  5.  Apply the CHG Soap to your body ONLY FROM THE NECK DOWN.        Do not use on open wounds or open sores.  Avoid contact with your eyes,       ears, mouth and genitals (private parts).  Wash genitals (private parts)       with your  normal soap.  6.  Wash thoroughly, paying special attention to the area where your surgery        will be performed.  7.  Thoroughly rinse your body with warm water from the neck down.  8.  DO NOT shower/wash with your normal soap after using and rinsing off       the CHG Soap.  9.  Pat yourself dry with a clean towel.            10.  Wear clean pajamas.            11.  Place clean sheets on your bed the night of your first shower and do not        sleep with pets.  Day of Surgery  Do not apply any lotions/deoderants the morning of surgery.  Please wear clean clothes to the hospital/surgery center.     Please read over the following fact sheets that you were given: Pain Booklet, Coughing and Deep Breathing, Blood Transfusion Information, MRSA Information and Surgical Site Infection Prevention

## 2014-07-29 NOTE — Progress Notes (Addendum)
Pt doesn't have a cardiololgist  Denies ever having an echo or heart cath;stress test   EKG in epic from 04-07-14  CXR in epic from 03-31-14  Medical Md is Dr.Steven Burdine with [facility] in [place]

## 2014-08-06 ENCOUNTER — Ambulatory Visit (HOSPITAL_COMMUNITY)
Admission: RE | Admit: 2014-08-06 | Discharge: 2014-08-06 | Disposition: A | Discharge status: Home / Self Care | Payer: BC Managed Care – PPO | Source: Ambulatory Visit | Attending: Urology | Admitting: Urology

## 2014-08-06 ENCOUNTER — Encounter (HOSPITAL_COMMUNITY)
Admission: RE | Admit: 2014-08-06 | Discharge: 2014-08-06 | Disposition: A | Discharge status: Home / Self Care | Payer: BC Managed Care – PPO | Source: Ambulatory Visit | Attending: Urology | Admitting: Urology

## 2014-08-06 DIAGNOSIS — N390 Urinary tract infection, site not specified: Secondary | ICD-10-CM | POA: Insufficient documentation

## 2014-08-06 MED ORDER — SODIUM CHLORIDE 0.9 % IJ SOLN: 10.0000 mL | Freq: Two times a day (BID) | INTRAMUSCULAR | Status: DC

## 2014-08-06 MED ORDER — SODIUM CHLORIDE 0.9 % IJ SOLN: 10.0000 mL | INTRAMUSCULAR | Status: DC | PRN

## 2014-08-06 MED ORDER — SODIUM CHLORIDE 0.9 % IV SOLN
250.0000 mg | Freq: Once | INTRAVENOUS | Status: AC
  Administered 2014-08-06: 250 mg via INTRAVENOUS
  Filled 2014-08-06: qty 250

## 2014-08-06 NOTE — Discharge Instructions (Signed)
PICC Home Guide A peripherally inserted central catheter (PICC) is a long, thin, flexible tube that is inserted into a vein in the upper arm. It is a form of intravenous (IV) access. It is considered to be a "central" line because the tip of the PICC ends in a large vein in your chest. This large vein is called the superior vena cava (SVC). The PICC tip ends in the SVC because there is a lot of blood flow in the SVC. This allows medicines and IV fluids to be quickly distributed throughout the body. The PICC is inserted using a sterile technique by a specially trained nurse or physician. After the PICC is inserted, a chest X-ray exam is done to be sure it is in the correct place.  A PICC may be placed for different reasons, such as:  To give medicines and liquid nutrition that can only be given through a central line. Examples are:  Certain antibiotic treatments.  Chemotherapy.  Total parenteral nutrition (TPN).  To take frequent blood samples.  To give IV fluids and blood products.  If there is difficulty placing a peripheral intravenous (PIV) catheter. If taken care of properly, a PICC can remain in place for several months. A PICC can also allow a person to go home from the hospital early. Medicine and PICC care can be managed at home by a family member or home health care team. WHAT PROBLEMS CAN HAPPEN WHEN I HAVE A PICC? Problems with a PICC can occasionally occur. These may include the following:  A blood clot (thrombus) forming in or at the tip of the PICC. This can cause the PICC to become clogged. A clot-dissolving medicine called tissue plasminogen activator (tPA) can be given through the PICC to help break up the clot.  Inflammation of the vein (phlebitis) in which the PICC is placed. Signs of inflammation may include redness, pain at the insertion site, red streaks, or being able to feel a "cord" in the vein where the PICC is located.  Infection in the PICC or at the insertion  site. Signs of infection may include fever, chills, redness, swelling, or pus drainage from the PICC insertion site.  PICC movement (malposition). The PICC tip may move from its original position due to excessive physical activity, forceful coughing, sneezing, or vomiting.  A break or cut in the PICC. It is important to not use scissors near the PICC.  Nerve or tendon irritation or injury during PICC insertion. WHAT SHOULD I KEEP IN MIND ABOUT ACTIVITIES WHEN I HAVE A PICC?  You may bend your arm and move it freely. If your PICC is near or at the bend of your elbow, avoid activity with repeated motion at the elbow.  Rest at home for the remainder of the day following PICC line insertion.  Avoid lifting heavy objects as instructed by your health care provider.  Avoid using a crutch with the arm on the same side as your PICC. You may need to use a walker. WHAT SHOULD I KNOW ABOUT MY PICC DRESSING?  Keep your PICC bandage (dressing) clean and dry to prevent infection.  Ask your health care provider when you may shower. Ask your health care provider to teach you how to wrap the PICC when you do take a shower.  Change the PICC dressing as instructed by your health care provider.  Change your PICC dressing if it becomes loose or wet. WHAT SHOULD I KNOW ABOUT PICC CARE?  Check the PICC insertion site   daily for leakage, redness, swelling, or pain.  Do not take a bath, swim, or use hot tubs when you have a PICC. Cover PICC line with clear plastic wrap and tape to keep it dry while showering.  Flush the PICC as directed by your health care provider. Let your health care provider know right away if the PICC is difficult to flush or does not flush. Do not use force to flush the PICC.  Do not use a syringe that is less than 10 mL to flush the PICC.  Never pull or tug on the PICC.  Avoid blood pressure checks on the arm with the PICC.  Keep your PICC identification card with you at all  times.  Do not take the PICC out yourself. Only a trained clinical professional should remove the PICC. SEEK IMMEDIATE MEDICAL CARE IF:  Your PICC is accidentally pulled all the way out. If this happens, cover the insertion site with a bandage or gauze dressing. Do not throw the PICC away. Your health care provider will need to inspect it.  Your PICC was tugged or pulled and has partially come out. Do not  push the PICC back in.  There is any type of drainage, redness, or swelling where the PICC enters the skin.  You cannot flush the PICC, it is difficult to flush, or the PICC leaks around the insertion site when it is flushed.  You hear a "flushing" sound when the PICC is flushed.  You have pain, discomfort, or numbness in your arm, shoulder, or jaw on the same side as the PICC.  You feel your heart "racing" or skipping beats.  You notice a hole or tear in the PICC.  You develop chills or a fever. MAKE SURE YOU:   Understand these instructions.  Will watch your condition.  Will get help right away if you are not doing well or get worse. Document Released: 06/17/2003 Document Revised: 04/27/2014 Document Reviewed: 08/18/2013 Exit[employer] Patient Information 2015 [employer]. This information is not intended to replace advice given to you by your health care provider. Make sure you discuss any questions you have with your health care provider.  

## 2014-08-06 NOTE — Progress Notes (Signed)
Peripherally Inserted Central Catheter/Midline Placement  The IV Nurse has discussed with the patient and/or persons authorized to consent for the patient, the purpose of this procedure and the potential benefits and risks involved with this procedure.  The benefits include less needle sticks, lab draws from the catheter and patient may be discharged home with the catheter.  Risks include, but not limited to, infection, bleeding, blood clot (thrombus formation), and puncture of an artery; nerve damage and irregular heat beat.  Alternatives to this procedure were also discussed.  PICC/Midline Placement Documentation  PICC / Midline Single Lumen 08/06/14 PICC Right Basilic 40 cm 0 cm (Active)  Indication for Insertion or Continuance of Line Home intravenous therapies (PICC only) 08/06/2014  9:30 AM  Site Assessment Clean;Dry;Intact 08/06/2014  9:30 AM  Line Status Infusing;Flushed;Saline locked;Capped (central line);Blood return noted 08/06/2014  9:30 AM  Dressing Type Transparent 08/06/2014  9:30 AM  Dressing Status Clean;Dry;Intact 08/06/2014  9:30 AM  Dressing Change Due 08/13/14 08/06/2014  9:30 AM       Marinda ElkMounce, Jaquise Faux 08/06/2014, 12:13 PM

## 2014-08-12 DIAGNOSIS — N390 Urinary tract infection, site not specified: Secondary | ICD-10-CM | POA: Diagnosis not present

## 2014-08-21 ENCOUNTER — Other Ambulatory Visit: Payer: Self-pay | Admitting: Neurosurgery

## 2014-08-21 ENCOUNTER — Encounter (HOSPITAL_COMMUNITY): Payer: Self-pay

## 2014-08-21 ENCOUNTER — Encounter (HOSPITAL_COMMUNITY)
Admission: RE | Admit: 2014-08-21 | Discharge: 2014-08-21 | Disposition: A | Discharge status: Home / Self Care | Payer: Worker's Compensation | Source: Ambulatory Visit | Attending: Neurosurgery | Admitting: Neurosurgery

## 2014-08-21 LAB — BASIC METABOLIC PANEL
ANION GAP: 10 (ref 5–15)
BUN: 15 mg/dL (ref 6–23)
CHLORIDE: 104 meq/L (ref 96–112)
CO2: 26 meq/L (ref 19–32)
Calcium: 9 mg/dL (ref 8.4–10.5)
Creatinine, Ser: 0.82 mg/dL (ref 0.50–1.10)
GFR calc Af Amer: 73 mL/min — ABNORMAL LOW (ref >90)
GFR calc non Af Amer: 63 mL/min — ABNORMAL LOW (ref >90)
Glucose, Bld: 94 mg/dL (ref 70–99)
POTASSIUM: 4.3 meq/L (ref 3.7–5.3)
SODIUM: 140 meq/L (ref 137–147)

## 2014-08-21 LAB — SURGICAL PCR SCREEN
MRSA, PCR: POSITIVE — AB
STAPHYLOCOCCUS AUREUS: POSITIVE — AB

## 2014-08-21 LAB — CBC
HEMATOCRIT: 38.2 % (ref 36.0–46.0)
Hemoglobin: 12.6 g/dL (ref 12.0–15.0)
MCH: 32.1 pg (ref 26.0–34.0)
MCHC: 33 g/dL (ref 30.0–36.0)
MCV: 97.2 fL (ref 78.0–100.0)
Platelets: 206 10*3/uL (ref 150–400)
RBC: 3.93 MIL/uL (ref 3.87–5.11)
RDW: 14.3 % (ref 11.5–15.5)
WBC: 8.3 10*3/uL (ref 4.0–10.5)

## 2014-08-21 LAB — TYPE AND SCREEN
ABO/RH(D): O POS
ANTIBODY SCREEN: NEGATIVE

## 2014-08-21 NOTE — Pre-Procedure Instructions (Signed)
April [name]  08/21/2014   Your procedure is scheduled on: Monday, August 24, 2014  Report to [facility] Admitting at 5;30 AM.  Call this number if you have problems the morning of surgery: [phone]   Remember:   Do not eat food or drink liquids after midnight .  Wear clean pajamas.  11.  Place clean sheets on your bed the night of your first shower and do not sleep with pets.  Day of Surgery  Do not apply any lotions the morning of surgery.  Please wear clean clothes to the hospital/surgery center.   Please read over the following fact sheets that you were given: Pain Booklet, Coughing and Deep Breathing, Blood Transfusion Information, MRSA Information and Surgical Site Infection Prevention

## 2014-08-24 ENCOUNTER — Encounter (HOSPITAL_COMMUNITY): Payer: Self-pay | Admitting: Surgery

## 2014-08-24 ENCOUNTER — Encounter (HOSPITAL_COMMUNITY): Payer: Worker's Compensation | Admitting: Certified Registered Nurse Anesthetist

## 2014-08-24 ENCOUNTER — Inpatient Hospital Stay (HOSPITAL_COMMUNITY)
Admission: RE | Admit: 2014-08-24 | Discharge: 2014-08-31 | DRG: 460 | Disposition: A | Discharge status: Rehabilitation Facility | Payer: Worker's Compensation | Source: Ambulatory Visit | Attending: Neurosurgery | Admitting: Neurosurgery

## 2014-08-24 ENCOUNTER — Encounter (HOSPITAL_COMMUNITY)
Admission: RE | Disposition: A | Discharge status: Rehabilitation Facility | Payer: Self-pay | Source: Ambulatory Visit | Attending: Neurosurgery

## 2014-08-24 ENCOUNTER — Inpatient Hospital Stay (HOSPITAL_COMMUNITY): Payer: Worker's Compensation

## 2014-08-24 ENCOUNTER — Inpatient Hospital Stay (HOSPITAL_COMMUNITY): Payer: Worker's Compensation | Admitting: Certified Registered Nurse Anesthetist

## 2014-08-24 DIAGNOSIS — E785 Hyperlipidemia, unspecified: Secondary | ICD-10-CM | POA: Diagnosis present

## 2014-08-24 DIAGNOSIS — E039 Hypothyroidism, unspecified: Secondary | ICD-10-CM | POA: Diagnosis present

## 2014-08-24 DIAGNOSIS — G8929 Other chronic pain: Secondary | ICD-10-CM | POA: Diagnosis present

## 2014-08-24 DIAGNOSIS — Z794 Long term (current) use of insulin: Secondary | ICD-10-CM

## 2014-08-24 DIAGNOSIS — E119 Type 2 diabetes mellitus without complications: Secondary | ICD-10-CM | POA: Diagnosis present

## 2014-08-24 DIAGNOSIS — Z7982 Long term (current) use of aspirin: Secondary | ICD-10-CM

## 2014-08-24 DIAGNOSIS — I10 Essential (primary) hypertension: Secondary | ICD-10-CM | POA: Diagnosis present

## 2014-08-24 DIAGNOSIS — K5900 Constipation, unspecified: Secondary | ICD-10-CM | POA: Diagnosis present

## 2014-08-24 DIAGNOSIS — K219 Gastro-esophageal reflux disease without esophagitis: Secondary | ICD-10-CM | POA: Diagnosis present

## 2014-08-24 DIAGNOSIS — M1990 Unspecified osteoarthritis, unspecified site: Secondary | ICD-10-CM | POA: Diagnosis present

## 2014-08-24 DIAGNOSIS — M48061 Spinal stenosis, lumbar region without neurogenic claudication: Principal | ICD-10-CM | POA: Diagnosis present

## 2014-08-24 DIAGNOSIS — Z96649 Presence of unspecified artificial hip joint: Secondary | ICD-10-CM

## 2014-08-24 LAB — GLUCOSE, CAPILLARY
GLUCOSE-CAPILLARY: 122 mg/dL — AB (ref 70–99)
Glucose-Capillary: 121 mg/dL — ABNORMAL HIGH (ref 70–99)

## 2014-08-24 SURGERY — POSTERIOR LUMBAR FUSION 1 LEVEL
Anesthesia: General | Site: Spine Lumbar

## 2014-08-24 MED ORDER — LEVOTHYROXINE SODIUM 25 MCG PO TABS
125.0000 ug | ORAL_TABLET | Freq: Every day | ORAL | Status: DC
  Administered 2014-08-25 – 2014-08-31 (×7): 125 ug via ORAL
  Filled 2014-08-24 (×12): qty 1

## 2014-08-24 MED ORDER — HYDROMORPHONE HCL PF 1 MG/ML IJ SOLN
0.2500 mg | INTRAMUSCULAR | Status: DC | PRN
  Administered 2014-08-24 (×2): 0.35 mg via INTRAVENOUS
  Administered 2014-08-24: 0.5 mg via INTRAVENOUS
  Administered 2014-08-24: 0.3 mg via INTRAVENOUS

## 2014-08-24 MED ORDER — CEFAZOLIN SODIUM 1-5 GM-% IV SOLN
1.0000 g | Freq: Three times a day (TID) | INTRAVENOUS | Status: AC
  Administered 2014-08-25: 1 g via INTRAVENOUS
  Filled 2014-08-24 (×2): qty 50

## 2014-08-24 MED ORDER — PHENOL 1.4 % MT LIQD: 1.0000 | OROMUCOSAL | Status: DC | PRN

## 2014-08-24 MED ORDER — PROPOFOL 10 MG/ML IV BOLUS
INTRAVENOUS | Status: AC
  Filled 2014-08-24: qty 20

## 2014-08-24 MED ORDER — AMLODIPINE BESYLATE 10 MG PO TABS
10.0000 mg | ORAL_TABLET | Freq: Every day | ORAL | Status: DC
  Administered 2014-08-25 – 2014-08-31 (×7): 10 mg via ORAL
  Filled 2014-08-24 (×8): qty 1

## 2014-08-24 MED ORDER — SIMVASTATIN 5 MG PO TABS
5.0000 mg | ORAL_TABLET | Freq: Every day | ORAL | Status: DC
  Administered 2014-08-25 – 2014-08-30 (×6): 5 mg via ORAL
  Filled 2014-08-24 (×6): qty 1

## 2014-08-24 MED ORDER — ONDANSETRON HCL 4 MG/2ML IJ SOLN
INTRAMUSCULAR | Status: DC | PRN
  Administered 2014-08-24: 4 mg via INTRAVENOUS

## 2014-08-24 MED ORDER — OXYCODONE-ACETAMINOPHEN 5-325 MG PO TABS
1.0000 | ORAL_TABLET | ORAL | Status: DC | PRN
Start: 2014-08-24 — End: 2014-08-31
  Administered 2014-08-24 – 2014-08-29 (×4): 2 via ORAL
  Filled 2014-08-24 (×4): qty 2

## 2014-08-24 MED ORDER — HYDROMORPHONE HCL PF 1 MG/ML IJ SOLN
0.5000 mg | INTRAMUSCULAR | Status: DC | PRN
  Administered 2014-08-24 (×2): 1 mg via INTRAVENOUS
  Administered 2014-08-24: 0.5 mg via INTRAVENOUS
  Administered 2014-08-25 (×6): 1 mg via INTRAVENOUS
  Administered 2014-08-25: 0.5 mg via INTRAVENOUS
  Administered 2014-08-25 (×2): 1 mg via INTRAVENOUS
  Administered 2014-08-26: 0.5 mg via INTRAVENOUS
  Administered 2014-08-26 – 2014-08-27 (×4): 1 mg via INTRAVENOUS
  Filled 2014-08-24 (×16): qty 1

## 2014-08-24 MED ORDER — MIDAZOLAM HCL 5 MG/5ML IJ SOLN
INTRAMUSCULAR | Status: DC | PRN
  Administered 2014-08-24: 2 mg via INTRAVENOUS

## 2014-08-24 MED ORDER — PROPOFOL 10 MG/ML IV BOLUS
INTRAVENOUS | Status: DC | PRN
  Administered 2014-08-24: 100 mg via INTRAVENOUS

## 2014-08-24 MED ORDER — ONDANSETRON HCL 4 MG/2ML IJ SOLN
INTRAMUSCULAR | Status: AC
  Filled 2014-08-24: qty 2

## 2014-08-24 MED ORDER — DOCUSATE SODIUM 100 MG PO CAPS
200.0000 mg | ORAL_CAPSULE | Freq: Every day | ORAL | Status: DC
  Administered 2014-08-25 – 2014-08-31 (×7): 200 mg via ORAL
  Filled 2014-08-24 (×7): qty 2

## 2014-08-24 MED ORDER — MIDAZOLAM HCL 2 MG/2ML IJ SOLN
INTRAMUSCULAR | Status: AC
  Filled 2014-08-24: qty 2

## 2014-08-24 MED ORDER — CEFAZOLIN SODIUM-DEXTROSE 2-3 GM-% IV SOLR
INTRAVENOUS | Status: AC
  Filled 2014-08-24: qty 50

## 2014-08-24 MED ORDER — HYDROMORPHONE HCL PF 1 MG/ML IJ SOLN
INTRAMUSCULAR | Status: AC
  Filled 2014-08-24: qty 1

## 2014-08-24 MED ORDER — DEXAMETHASONE SODIUM PHOSPHATE 10 MG/ML IJ SOLN
INTRAMUSCULAR | Status: DC | PRN
  Administered 2014-08-24: 4 mg via INTRAVENOUS

## 2014-08-24 MED ORDER — LACTATED RINGERS IV SOLN
INTRAVENOUS | Status: DC | PRN
  Administered 2014-08-24 (×3): via INTRAVENOUS

## 2014-08-24 MED ORDER — SODIUM CHLORIDE 0.9 % IJ SOLN: 3.0000 mL | INTRAMUSCULAR | Status: DC | PRN

## 2014-08-24 MED ORDER — POTASSIUM CHLORIDE IN NACL 20-0.45 MEQ/L-% IV SOLN
INTRAVENOUS | Status: DC
  Administered 2014-08-25: 08:00:00 via INTRAVENOUS
  Administered 2014-08-27: 1000 mL via INTRAVENOUS
  Filled 2014-08-24 (×11): qty 1000

## 2014-08-24 MED ORDER — ALUM & MAG HYDROXIDE-SIMETH 200-200-20 MG/5ML PO SUSP: 30.0000 mL | Freq: Four times a day (QID) | ORAL | Status: DC | PRN

## 2014-08-24 MED ORDER — ARTIFICIAL TEARS OP OINT
TOPICAL_OINTMENT | OPHTHALMIC | Status: DC | PRN
  Administered 2014-08-24: 1 via OPHTHALMIC

## 2014-08-24 MED ORDER — NEOSTIGMINE METHYLSULFATE 10 MG/10ML IV SOLN
INTRAVENOUS | Status: DC | PRN
  Administered 2014-08-24: 3 mg via INTRAVENOUS
  Administered 2014-08-24: 2 mg via INTRAVENOUS

## 2014-08-24 MED ORDER — FENTANYL CITRATE 0.05 MG/ML IJ SOLN
INTRAMUSCULAR | Status: AC
  Filled 2014-08-24: qty 5

## 2014-08-24 MED ORDER — VITAMIN D (ERGOCALCIFEROL) 1.25 MG (50000 UNIT) PO CAPS: 50000.0000 [IU] | ORAL_CAPSULE | ORAL | Status: DC

## 2014-08-24 MED ORDER — SURGIFOAM 100 EX MISC
CUTANEOUS | Status: DC | PRN
  Administered 2014-08-24 (×2): via TOPICAL

## 2014-08-24 MED ORDER — ACETAMINOPHEN 325 MG PO TABS: 650.0000 mg | ORAL_TABLET | ORAL | Status: DC | PRN

## 2014-08-24 MED ORDER — HYDROCODONE-ACETAMINOPHEN 5-325 MG PO TABS
1.0000 | ORAL_TABLET | ORAL | Status: DC | PRN
  Administered 2014-08-26 – 2014-08-29 (×5): 2 via ORAL
  Filled 2014-08-24 (×5): qty 2

## 2014-08-24 MED ORDER — GEMFIBROZIL 600 MG PO TABS
600.0000 mg | ORAL_TABLET | Freq: Two times a day (BID) | ORAL | Status: DC
  Administered 2014-08-25 – 2014-08-31 (×12): 600 mg via ORAL
  Filled 2014-08-24 (×17): qty 1

## 2014-08-24 MED ORDER — 0.9 % SODIUM CHLORIDE (POUR BTL) OPTIME
TOPICAL | Status: DC | PRN
  Administered 2014-08-24: 1000 mL

## 2014-08-24 MED ORDER — FENTANYL CITRATE 0.05 MG/ML IJ SOLN
INTRAMUSCULAR | Status: DC | PRN
  Administered 2014-08-24 (×3): 50 ug via INTRAVENOUS
  Administered 2014-08-24: 150 ug via INTRAVENOUS
  Administered 2014-08-24 (×2): 50 ug via INTRAVENOUS

## 2014-08-24 MED ORDER — ASPIRIN EC 81 MG PO TBEC
81.0000 mg | DELAYED_RELEASE_TABLET | Freq: Every day | ORAL | Status: DC
  Administered 2014-08-25 – 2014-08-31 (×7): 81 mg via ORAL
  Filled 2014-08-24 (×7): qty 1

## 2014-08-24 MED ORDER — ROCURONIUM BROMIDE 100 MG/10ML IV SOLN
INTRAVENOUS | Status: DC | PRN
  Administered 2014-08-24: 50 mg via INTRAVENOUS
  Administered 2014-08-24: 30 mg via INTRAVENOUS

## 2014-08-24 MED ORDER — ROCURONIUM BROMIDE 50 MG/5ML IV SOLN
INTRAVENOUS | Status: AC
  Filled 2014-08-24: qty 1

## 2014-08-24 MED ORDER — ATENOLOL 100 MG PO TABS
100.0000 mg | ORAL_TABLET | Freq: Every day | ORAL | Status: DC
  Administered 2014-08-26 – 2014-08-31 (×6): 100 mg via ORAL
  Filled 2014-08-24 (×7): qty 1

## 2014-08-24 MED ORDER — OXYCODONE-ACETAMINOPHEN 5-325 MG PO TABS
ORAL_TABLET | ORAL | Status: AC
  Filled 2014-08-24: qty 2

## 2014-08-24 MED ORDER — INSULIN GLARGINE 100 UNIT/ML ~~LOC~~ SOLN
35.0000 [IU] | Freq: Every day | SUBCUTANEOUS | Status: DC
  Administered 2014-08-24 – 2014-08-29 (×6): 35 [IU] via SUBCUTANEOUS
  Filled 2014-08-24 (×7): qty 0.35

## 2014-08-24 MED ORDER — ALENDRONATE SODIUM 70 MG PO TABS: 70.0000 mg | ORAL_TABLET | ORAL | Status: DC

## 2014-08-24 MED ORDER — LIDOCAINE HCL (CARDIAC) 20 MG/ML IV SOLN
INTRAVENOUS | Status: AC
  Filled 2014-08-24: qty 5

## 2014-08-24 MED ORDER — ALENDRONATE SODIUM 70 MG PO TABS
70.0000 mg | ORAL_TABLET | ORAL | Status: DC
  Filled 2014-08-24: qty 1

## 2014-08-24 MED ORDER — CEFAZOLIN SODIUM-DEXTROSE 2-3 GM-% IV SOLR
INTRAVENOUS | Status: DC | PRN
  Administered 2014-08-24: 2 g via INTRAVENOUS

## 2014-08-24 MED ORDER — SODIUM CHLORIDE 0.9 % IR SOLN
Status: DC | PRN
  Administered 2014-08-24: 09:00:00

## 2014-08-24 MED ORDER — SUCCINYLCHOLINE CHLORIDE 20 MG/ML IJ SOLN
INTRAMUSCULAR | Status: AC
  Filled 2014-08-24: qty 1

## 2014-08-24 MED ORDER — SODIUM CHLORIDE 0.9 % IJ SOLN
3.0000 mL | Freq: Two times a day (BID) | INTRAMUSCULAR | Status: DC
  Administered 2014-08-24 – 2014-08-25 (×2): 3 mL via INTRAVENOUS

## 2014-08-24 MED ORDER — SODIUM CHLORIDE 0.9 % IV SOLN: 250.0000 mL | INTRAVENOUS | Status: DC

## 2014-08-24 MED ORDER — EPHEDRINE SULFATE 50 MG/ML IJ SOLN
INTRAMUSCULAR | Status: DC | PRN
  Administered 2014-08-24 (×2): 10 mg via INTRAVENOUS

## 2014-08-24 MED ORDER — ACETAMINOPHEN 650 MG RE SUPP: 650.0000 mg | RECTAL | Status: DC | PRN

## 2014-08-24 MED ORDER — ONDANSETRON HCL 4 MG/2ML IJ SOLN: 4.0000 mg | INTRAMUSCULAR | Status: DC | PRN

## 2014-08-24 MED ORDER — ESTROGENS, CONJUGATED 0.625 MG/GM VA CREA
1.0000 | TOPICAL_CREAM | VAGINAL | Status: DC
  Administered 2014-08-26 – 2014-08-30 (×4): 1 via VAGINAL
  Filled 2014-08-24: qty 42.5

## 2014-08-24 MED ORDER — LIDOCAINE-EPINEPHRINE 1 %-1:100000 IJ SOLN
INTRAMUSCULAR | Status: DC | PRN
  Administered 2014-08-24: 8 mL

## 2014-08-24 MED ORDER — MENTHOL 3 MG MT LOZG
1.0000 | LOZENGE | OROMUCOSAL | Status: DC | PRN
Start: 2014-08-24 — End: 2014-08-31

## 2014-08-24 MED ORDER — GLYCOPYRROLATE 0.2 MG/ML IJ SOLN
INTRAMUSCULAR | Status: DC | PRN
  Administered 2014-08-24: 0.4 mg via INTRAVENOUS
  Administered 2014-08-24: 0.6 mg via INTRAVENOUS

## 2014-08-24 MED ORDER — BUPIVACAINE HCL (PF) 0.25 % IJ SOLN
INTRAMUSCULAR | Status: DC | PRN
  Administered 2014-08-24: 10 mL

## 2014-08-24 MED ORDER — LISINOPRIL 20 MG PO TABS
40.0000 mg | ORAL_TABLET | Freq: Every day | ORAL | Status: DC
  Administered 2014-08-25 – 2014-08-31 (×6): 40 mg via ORAL
  Filled 2014-08-24 (×8): qty 2

## 2014-08-24 SURGICAL SUPPLY — 81 items
BAG DECANTER FOR FLEXI CONT (MISCELLANEOUS) ×2 IMPLANT
BENZOIN TINCTURE PRP APPL 2/3 (GAUZE/BANDAGES/DRESSINGS) ×2 IMPLANT
BIT DRILL 5.0/4.0 (BIT) ×1 IMPLANT
BLADE SURG 11 STRL SS (BLADE) ×2 IMPLANT
BLADE SURG ROTATE 9660 (MISCELLANEOUS) IMPLANT
BONE ALLOSTEM MORSELIZED 5CC (Bone Implant) ×2 IMPLANT
BRUSH SCRUB EZ PLAIN DRY (MISCELLANEOUS) ×2 IMPLANT
BUR MATCHSTICK NEURO 3.0 LAGG (BURR) ×2 IMPLANT
BUR PRECISION FLUTE 6.0 (BURR) ×2 IMPLANT
CANISTER SUCT 3000ML (MISCELLANEOUS) ×2 IMPLANT
CAP LOCKING (Cap) ×4 IMPLANT
CAP LOCKING 5.5 CREO (Cap) ×4 IMPLANT
CONT SPEC 4OZ CLIKSEAL STRL BL (MISCELLANEOUS) ×4 IMPLANT
COVER BACK TABLE 24X17X13 BIG (DRAPES) IMPLANT
COVER MAYO STAND STRL (DRAPES) ×2 IMPLANT
COVER TABLE BACK 60X90 (DRAPES) ×2 IMPLANT
DECANTER SPIKE VIAL GLASS SM (MISCELLANEOUS) ×2 IMPLANT
DERMABOND ADHESIVE PROPEN (GAUZE/BANDAGES/DRESSINGS) ×1
DERMABOND ADVANCED (GAUZE/BANDAGES/DRESSINGS)
DERMABOND ADVANCED .7 DNX12 (GAUZE/BANDAGES/DRESSINGS) IMPLANT
DERMABOND ADVANCED .7 DNX6 (GAUZE/BANDAGES/DRESSINGS) ×1 IMPLANT
DRAPE C-ARM 42X72 X-RAY (DRAPES) ×4 IMPLANT
DRAPE C-ARMOR (DRAPES) ×2 IMPLANT
DRAPE LAPAROTOMY 100X72X124 (DRAPES) ×2 IMPLANT
DRAPE POUCH INSTRU U-SHP 10X18 (DRAPES) ×2 IMPLANT
DRAPE PROXIMA HALF (DRAPES) IMPLANT
DRAPE SURG 17X23 STRL (DRAPES) ×2 IMPLANT
DRILL 5.0/4.0 (BIT) ×2
DRSG OPSITE 4X5.5 SM (GAUZE/BANDAGES/DRESSINGS) ×4 IMPLANT
DRSG OPSITE POSTOP 4X6 (GAUZE/BANDAGES/DRESSINGS) ×2 IMPLANT
DURAPREP 26ML APPLICATOR (WOUND CARE) ×2 IMPLANT
DURASEAL APPLICATOR TIP (TIP) ×2 IMPLANT
DURASEAL SPINE SEALANT 3ML (MISCELLANEOUS) ×2 IMPLANT
ELECT REM PT RETURN 9FT ADLT (ELECTROSURGICAL) ×4
ELECTRODE REM PT RTRN 9FT ADLT (ELECTROSURGICAL) ×2 IMPLANT
EVACUATOR 3/16  PVC DRAIN (DRAIN) ×1
EVACUATOR 3/16 PVC DRAIN (DRAIN) ×1 IMPLANT
GAUZE SPONGE 4X4 12PLY STRL (GAUZE/BANDAGES/DRESSINGS) IMPLANT
GAUZE SPONGE 4X4 16PLY XRAY LF (GAUZE/BANDAGES/DRESSINGS) IMPLANT
GLOVE BIO SURGEON STRL SZ8 (GLOVE) ×4 IMPLANT
GLOVE BIOGEL PI IND STRL 7.0 (GLOVE) ×2 IMPLANT
GLOVE BIOGEL PI IND STRL 7.5 (GLOVE) ×1 IMPLANT
GLOVE BIOGEL PI INDICATOR 7.0 (GLOVE) ×2
GLOVE BIOGEL PI INDICATOR 7.5 (GLOVE) ×1
GLOVE ECLIPSE 7.5 STRL STRAW (GLOVE) ×2 IMPLANT
GLOVE EXAM NITRILE XL STR (GLOVE) IMPLANT
GLOVE SS BIOGEL STRL SZ 6.5 (GLOVE) ×3 IMPLANT
GLOVE SUPERSENSE BIOGEL SZ 6.5 (GLOVE) ×3
GOWN STRL REUS W/ TWL LRG LVL3 (GOWN DISPOSABLE) ×2 IMPLANT
GOWN STRL REUS W/ TWL XL LVL3 (GOWN DISPOSABLE) ×2 IMPLANT
GOWN STRL REUS W/TWL 2XL LVL3 (GOWN DISPOSABLE) IMPLANT
GOWN STRL REUS W/TWL LRG LVL3 (GOWN DISPOSABLE) ×2
GOWN STRL REUS W/TWL XL LVL3 (GOWN DISPOSABLE) ×2
IMPL SPINE RISE 8-15-10-26M (Neuro Prosthesis/Implant) ×2 IMPLANT
IMPLANT SPINE RISE 8-15-10-26M (Neuro Prosthesis/Implant) ×4 IMPLANT
KIT BASIN OR (CUSTOM PROCEDURE TRAY) ×2 IMPLANT
KIT ROOM TURNOVER OR (KITS) ×2 IMPLANT
NEEDLE HYPO 25X1 1.5 SAFETY (NEEDLE) ×2 IMPLANT
NS IRRIG 1000ML POUR BTL (IV SOLUTION) ×2 IMPLANT
PACK LAMINECTOMY NEURO (CUSTOM PROCEDURE TRAY) ×2 IMPLANT
PAD ARMBOARD 7.5X6 YLW CONV (MISCELLANEOUS) ×6 IMPLANT
PATTIES SURGICAL .5 X.5 (GAUZE/BANDAGES/DRESSINGS) ×2 IMPLANT
PATTIES SURGICAL 1X1 (DISPOSABLE) ×2 IMPLANT
ROD SPINAL 35MM (Rod) ×2 IMPLANT
ROD STRAIGHT 5.5X35MM (Rod) ×2 IMPLANT
SCREW CORTICAL CREO 5.5-4.5X30 (Screw) ×2 IMPLANT
SCREW MOD 5.0-4.0X30MM (Hook) ×2 IMPLANT
SPONGE LAP 4X18 X RAY DECT (DISPOSABLE) IMPLANT
SPONGE SURGIFOAM ABS GEL 100 (HEMOSTASIS) ×2 IMPLANT
STRIP CLOSURE SKIN 1/2X4 (GAUZE/BANDAGES/DRESSINGS) ×2 IMPLANT
SUT PROLENE 6 0 BV (SUTURE) ×4 IMPLANT
SUT VIC AB 0 CT1 18XCR BRD8 (SUTURE) ×2 IMPLANT
SUT VIC AB 0 CT1 8-18 (SUTURE) ×2
SUT VIC AB 2-0 CT1 18 (SUTURE) ×2 IMPLANT
SUT VICRYL 4-0 PS2 18IN ABS (SUTURE) ×2 IMPLANT
SYR 20ML ECCENTRIC (SYRINGE) ×2 IMPLANT
TOWEL OR 17X24 6PK STRL BLUE (TOWEL DISPOSABLE) ×2 IMPLANT
TOWEL OR 17X26 10 PK STRL BLUE (TOWEL DISPOSABLE) ×2 IMPLANT
TRAY FOLEY CATH 14FRSI W/METER (CATHETERS) ×2 IMPLANT
TULIP CREP AMP 5.5MM (Orthopedic Implant) ×8 IMPLANT
WATER STERILE IRR 1000ML POUR (IV SOLUTION) ×2 IMPLANT

## 2014-08-24 NOTE — Anesthesia Postprocedure Evaluation (Signed)
  Anesthesia Post-op Note  Patient: April [name]  Procedure(s) Performed: Procedure(s): DECOMPRESSION LUMBAR LAMINECTOMY AND POSTERIOR INTERBODY FUSION LEVEL THREE-FOUR (N/A)  Patient Location: PACU  Anesthesia Type:General  Level of Consciousness: awake and alert   Airway and Oxygen Therapy: Patient Spontanous Breathing  Post-op Pain: none  Post-op Assessment: Post-op Vital signs reviewed, Patient's Cardiovascular Status Stable and Respiratory Function Stable  Post-op Vital Signs: Reviewed  Filed Vitals:   08/24/14 1315  BP: 155/57  Pulse: 63  Temp:   Resp: 18    Complications: No apparent anesthesia complications

## 2014-08-24 NOTE — Anesthesia Procedure Notes (Signed)
Procedure Name: Intubation Date/Time: 08/24/2014 7:31 AM Performed by: Reine Just Pre-anesthesia Checklist: Patient identified, Emergency Drugs available, Suction available, Patient being monitored and Timeout performed Patient Re-evaluated:Patient Re-evaluated prior to inductionOxygen Delivery Method: Circle system utilized and Simple face mask Preoxygenation: Pre-oxygenation with 100% oxygen Intubation Type: IV induction Ventilation: Mask ventilation without difficulty Laryngoscope Size: Miller and 3 Grade View: Grade III Tube type: Oral Tube size: 7.5 mm Number of attempts: 1 Airway Equipment and Method: Patient positioned with wedge pillow and Stylet Placement Confirmation: ETT inserted through vocal cords under direct vision,  positive ETCO2 and breath sounds checked- equal and bilateral Secured at: 21 cm Tube secured with: Tape Dental Injury: Teeth and Oropharynx as per pre-operative assessment

## 2014-08-24 NOTE — Transfer of Care (Signed)
Immediate Anesthesia Transfer of Care Note  Patient: April [name]  Procedure(s) Performed: Procedure(s): DECOMPRESSION LUMBAR LAMINECTOMY AND POSTERIOR INTERBODY FUSION LEVEL THREE-FOUR (N/A)  Patient Location: PACU  Anesthesia Type:General  Level of Consciousness: awake and alert   Airway & Oxygen Therapy: Patient Spontanous Breathing and Patient connected to face mask oxygen  Post-op Assessment: Report given to PACU RN and Post -op Vital signs reviewed and stable  Post vital signs: Reviewed and stable  Complications: No apparent anesthesia complications

## 2014-08-24 NOTE — H&P (Signed)
April [name] is an 80 y.o. female.   Chief Complaint: Back and left greater right leg pain HPI: Patient is a very pleasant 80 year old female who has had progressive worsening back and bilateral leg pain rating down to the front of her shin adjacent of her foot consistent with predominately and L4 nerve root pattern. Workup revealed severe spinal stenosis with virtually complete CSF block at L3-4 with a virtually autofused L5-S1. Due to patient's failed conservative treatment progressive weakness and disability in her back and legs and her imaging findings consistent with severe and critical stenosis at L3-4, I recommended decompression stabilization procedure at L3-4. I extensively reviewed the risks and benefits of the operation the patient as well as perioperative course and expectations of outcome and alternatives surgery and she understands and agrees to proceed forward.   Past Medical History  Diagnosis Date  . Hyperlipidemia 04/07/2014    takes Lovastatin daily  . OA (osteoarthritis) 04/07/2014  . Hypertension     takes Amlodipine and Atenolol daily  . HTN (hypertension) 04/07/2014  . Diabetes mellitus without complication     takes Lantus daily  . Hypothyroidism 04/07/2014    takes Synthroid daily  . History of bronchitis 20 yrs ago  . History of lupus 38yrs ago  . Joint pain   . Peripheral edema   . UTI (lower urinary tract infection)     completed antibioitcs on 07/28/14  . Urinary frequency     takes Vesicare daily  . Chronic back pain     stenosis  . Constipation     takes an OTC stool softener   . GERD (gastroesophageal reflux disease)     not on Protonix daily  . Nocturia   . Cataract     left eye  . History of shingles     Past Surgical History  Procedure Laterality Date  . Cholecystectomy    . Hip arthroplasty Left 04/08/2014    Procedure: LEFT HIP HEMIARTHROPLASTY;  Surgeon: Verlee Rossetti, MD;  Location: [facility] OR;  Service: Orthopedics;  Laterality: Left;  .  Tonsillectomy    . Adenoidectomy    . Left cataract removed    . Eye surgery      History reviewed. No pertinent family history. Social History:  reports that she has never smoked. She has never used smokeless tobacco. She reports that she does not drink alcohol or use illicit drugs.  Allergies: No Known Allergies  Medications Prior to Admission  Medication Sig Dispense Refill  . alendronate (FOSAMAX) 70 MG tablet Take 70 mg by mouth once a week. Take with a full glass of water on an empty stomach.      Marland Kitchen amLODipine (NORVASC) 10 MG tablet Take 1 tablet (10 mg total) by mouth daily.  30 tablet  1  . atenolol (TENORMIN) 100 MG tablet Take 1 tablet (100 mg total) by mouth daily.  30 tablet  1  . conjugated estrogens (PREMARIN) vaginal cream Place 1 Applicatorful vaginally every other day. Uses Monday, Wednesday, and Friday      . docusate sodium (COLACE) 100 MG capsule Take 200 mg by mouth daily.      Marland Kitchen gemfibrozil (LOPID) 600 MG tablet Take 1 tablet (600 mg total) by mouth 2 (two) times daily before a meal.  60 tablet  1  . HYDROcodone-acetaminophen (NORCO/VICODIN) 5-325 MG per tablet Take 1-2 tablets by mouth every 4 (four) hours as needed for moderate pain.      Marland Kitchen insulin glargine (LANTUS)  100 UNIT/ML injection Inject 35 Units into the skin at bedtime.       Marland Kitchen levothyroxine (SYNTHROID, LEVOTHROID) 125 MCG tablet Take 1 tablet (125 mcg total) by mouth daily before breakfast.  30 tablet  1  . lisinopril (PRINIVIL,ZESTRIL) 40 MG tablet Take 1 tablet (40 mg total) by mouth daily.  30 tablet  1  . lovastatin (MEVACOR) 40 MG tablet Take 40 mg by mouth daily.      . solifenacin (VESICARE) 5 MG tablet Take 5 mg by mouth daily.      Marland Kitchen aspirin EC 81 MG tablet Take 81 mg by mouth daily.      . Vitamin D, Ergocalciferol, (DRISDOL) 50000 UNITS CAPS capsule Take 50,000 Units by mouth every 7 (seven) days.        Results for orders placed during the hospital encounter of 08/24/14 (from the past 48  hour(s))  GLUCOSE, CAPILLARY     Status: Abnormal   Collection Time    08/24/14  6:36 AM      Result Value Ref Range   Glucose-Capillary 122 (*) 70 - 99 mg/dL   No results found.  Review of Systems  Constitutional: Negative.   HENT: Negative.   Eyes: Negative.   Respiratory: Negative.   Cardiovascular: Negative.   Gastrointestinal: Negative.   Genitourinary: Negative.   Musculoskeletal: Positive for back pain, joint pain and myalgias.  Skin: Negative.   Neurological: Positive for tingling and tremors.    Blood pressure 154/65, pulse 58, temperature 97.1 F (36.2 C), temperature source Oral, resp. rate 20, SpO2 97.00%. Physical Exam  Constitutional: She is oriented to person, place, and time. She appears well-developed and well-nourished.  HENT:  Head: Normocephalic.  Eyes: Pupils are equal, round, and reactive to light.  Neck: Normal range of motion.  Cardiovascular: Normal rate and regular rhythm.   Respiratory: Effort normal.  GI: Soft.  Musculoskeletal: Normal range of motion.  Neurological: She is alert and oriented to person, place, and time. She has normal strength. GCS eye subscore is 4. GCS verbal subscore is 5. GCS motor subscore is 6.  Reflex Scores:      Patellar reflexes are 0 on the right side and 0 on the left side.      Achilles reflexes are 0 on the right side and 0 on the left side. Strength is 5 out of 5 in her iliopsoas, quads, hamstrings, gastric comment tibialis, EHL.  Skin: Skin is warm and dry.     Assessment/Plan 80 year female presents for decompression stabilization procedure at L3-4.  April [name] P 08/24/2014, 7:13 AM

## 2014-08-24 NOTE — Progress Notes (Signed)
PHARMACIST - PHYSICIAN COMMUNICATION  CONCERNING: P&T Medication Policy Regarding Oral Bisphosphonates  RECOMMENDATION: Your order for alendronate (Fosamax), ibandronate (Boniva), or risedronate (Actonel) has been discontinued at this time.  If the patient's post-hospital medical condition warrants safe use of this class of drugs, please resume the pre-hospital regimen upon discharge.  DESCRIPTION:  Alendronate (Fosamax), ibandronate (Boniva), and risedronate (Actonel) can cause severe esophageal erosions in patients who are unable to remain upright at least 30 minutes after taking this medication.   Since brief interruptions in therapy are thought to have minimal impact on bone mineral density, the Pharmacy & Therapeutics Committee has established that bisphosphonate orders should be routinely discontinued during hospitalization.   To override this safety policy and permit administration of Boniva, Fosamax, or Actonel in the hospital, prescribers must write "DO NOT HOLD" in the comments section when placing the order for this class of medications.  Enzo Bi, PharmD Clinical Pharmacist Resident Pager [phone] 08/24/2014 5:39 PM

## 2014-08-24 NOTE — Op Note (Signed)
Preoperative diagnosis: Lumbar spinal stenosis and severe foraminal stenosis L3-L4 with bilateral L3 and L4 radiculopathies  Postoperative diagnosis: Same  Procedure: Decompressive lumbar laminectomy L3 for complete medial facetectomies radical foraminotomies and access and requiring more work than would be needed with a standard interbody fusion  #2 posterior lumbar interbody fusion L3-4 using the globus rise expandable peek cages packed with local autograft mixed withAllostem morsels  #3 cortical screw placement L3-4 using the globus modular cortical screw set  #4 repair of inadvertent dural tear  Surgeon: Jillyn Hidden Arabela Basaldua  Assistant: Sherilyn Cooter pool  Anesthesia: Gen.  EBL: 300  Complications: inadvertent dural tear  History of present illness: Patient is a 89 female is a progress worsening back and bilateral leg pain rate N. the outside lateral aspect of her causing the front of her shin and occasionally into her feet. This verified that all forms of conservative workup revealed severe lumbar spinal stenosis and severe lateral recess stenosis L3-4. The patient takes her treatment imaging findings and progression of clinical syndrome I recommended decompression stabilization procedure at L3-4. I extensively reviewed the risks and benefits of the operation the patient as well as perioperative course expectations of outcome and alternatives surgery and she understood and agreed to proceed forward.  Operative procedure: Patient brought into the or was induced under general anesthesia and positioned prone the Wilson frame her back was prepped and draped in routine sterile fashion after infiltration of 10 cc lidocaine with epi a midline incision was made and Bovie light cautery was used taken the subcutaneous tissues and subperiosteal dissections care lamina of L3 and L4 confirmed with her operative x-ray. Then the facet complexes at L3-4 lateral pars was identified and using AP and lateral fluoroscopy  identified the right L3 pedicle the left L3 pedicles more difficult to visualize it utilizing the right L3 pedicle landmarks at 7:00 I drill a pilot hole drilled superiorly and laterally approximately 3 mm probed With a 4.5 tap and then placed 50 screws at L3 on the right at this point attention second the decompression spinous process of L3 and superior aspect L4 smooth central decompression was begun I. sensory drilled into both facet complexes at L3-4 and working from lateral to medial to the extensor amount of overgrowth and severe thecal sac compression I freed up the facet complexes are removed those identified and some scar tissue and removed excess amount of arthropathy causing severe hourglass compression of thecal sac. Identify both L3 and L4 nerve roots were skeletonized and unroofed flush with pedicle there was a large spur coming off the patient's left side at L3-4 and this was densely near to the undersurface of spur the spur and related to the dura the dura was very thin data there is no sign removed at bony island and spur dural laceration was identified this was repaired primarily with 6-0 Prolene. The repair was complete with no evidence of CSF leak after repair images were taken confirming adequate foraminotomies disc spaces were identified epidural veins are Cardia disc spaces cleanout ball sized pituitary rongeurs Epstein curettes and shavers the 8 distractor inserted the patient's left side the right side endplates were prepared central disc was removed extensive amount of the guide this was removed laterally from underneath the L3 root as well. Bilaterally then a 8 mm expandable 10 lordotic 26 mm length cage was inserted and opened up under fluoroscopy. Then the distractors removed this was cleaned out and the opposite side local autograft was packed centrally contralateral cages in  place. After all interbody work been done this taken and a cortical screw placement at L4 these holes were  again identified under fluoroscopy drilled probed and tapped probed and 5-30 mm screws inserted L4. At this point under direct visualization the pedicle and AP lateral fluoroscopy I placed the L3 screw in the left side. Again all screws excellent purchase they were all competent with probing and then the head were assembled on the rods were assembled on the dura was then reinspected proctitis and torqued and torqued in place all foramina were confirmed patent this she was overlaid top of the repair of the dural defect Gelfoam and the more dura CL then meticulous in space was maintained and the wounds closed in layers with after Vicryl the skin was closed running 4 subcuticular sterile dressing was applied patient recovered in stable condition. At the end of case on it counts sponge counts were correct. I did apply Darvon and Steri-Strips prior to dressing.

## 2014-08-24 NOTE — Anesthesia Preprocedure Evaluation (Addendum)
Anesthesia Evaluation  Patient identified by MRN, date of birth, ID band Patient awake    Reviewed: Allergy & Precautions, H&P , NPO status , Patient's Chart, lab work & pertinent test results, reviewed documented beta blocker date and time   Airway Mallampati: II TM Distance: >3 FB Neck ROM: Full    Dental no notable dental hx. (+) Chipped, Dental Advisory Given   Pulmonary neg pulmonary ROS,  breath sounds clear to auscultation  Pulmonary exam normal       Cardiovascular hypertension, Pt. on medications and Pt. on home beta blockers Rhythm:Regular Rate:Normal     Neuro/Psych negative neurological ROS  negative psych ROS   GI/Hepatic Neg liver ROS, GERD-  Controlled,  Endo/Other  diabetes, Well Controlled, Type 1, Insulin DependentHypothyroidism   Renal/GU negative Renal ROS  negative genitourinary   Musculoskeletal   Abdominal   Peds  Hematology negative hematology ROS (+)   Anesthesia Other Findings   Reproductive/Obstetrics negative OB ROS                         Anesthesia Physical Anesthesia Plan  ASA: III  Anesthesia Plan: General   Post-op Pain Management:    Induction: Intravenous  Airway Management Planned: Oral ETT  Additional Equipment:   Intra-op Plan:   Post-operative Plan: Extubation in OR  Informed Consent: I have reviewed the patients History and Physical, chart, labs and discussed the procedure including the risks, benefits and alternatives for the proposed anesthesia with the patient or authorized representative who has indicated his/her understanding and acceptance.   Dental advisory given  Plan Discussed with: CRNA  Anesthesia Plan Comments:         Anesthesia Quick Evaluation

## 2014-08-25 LAB — GLUCOSE, CAPILLARY
Glucose-Capillary: 103 mg/dL — ABNORMAL HIGH (ref 70–99)
Glucose-Capillary: 80 mg/dL (ref 70–99)
Glucose-Capillary: 102 mg/dL — ABNORMAL HIGH (ref 70–99)

## 2014-08-25 MED ORDER — SODIUM CHLORIDE 0.9 % IJ SOLN: 10.0000 mL | Freq: Two times a day (BID) | INTRAMUSCULAR | Status: DC

## 2014-08-25 MED ORDER — SODIUM CHLORIDE 0.9 % IJ SOLN
10.0000 mL | INTRAMUSCULAR | Status: DC | PRN
  Administered 2014-08-26 – 2014-08-31 (×5): 10 mL

## 2014-08-25 NOTE — Progress Notes (Signed)
UR COMPLETED  

## 2014-08-25 NOTE — Progress Notes (Signed)
Pt daughter called RN to room for pain medication and RN went to assess patient. On RA pts sats were 80%. Placed on 2L [place] and sats up to 92-94%. Pt stated pain throughout dayshift however did not look in any acute distress. Nurse admin pain meds as ordered/charted a total of  dilaudid and  percocet in last 10 hours. Upon reassessment of pain post dilaudid IV and percocet PO meds admin, nurse observed pt in bed with eyes closed, relaxed, calm, without distress.Nurse educated pt on side effects of pain narcotics (constipation, respiratory depression, hypotension, etc) and closely monitoring vitals. Pt verbally acknowledged understanding and RN provided ice pack to back. Pt stated adequate pain relief with dilaudid IV but stated that percocet did not help alleviate any pain. Nurse educated pt on pain management and alternatives such as relaxation/breathing techniques and repositioning. Pt and pt's family members who were present at bedside verbally acknowledged understanding although upset that patient is still "hurting". Nurse provided reassurance. Patient also stated she has not been able to pass gas. Bowel sounds active, abdomen soft non tender, non distended. Will continue to monitor.  Andrew Au I, RN 08/25/2014 6:25 PM

## 2014-08-25 NOTE — Progress Notes (Signed)
Subjective: Patient reports Overall she's doing well no headache a lot of incisional pain leg pain some better than preop  Objective: Vital signs in last 24 hours: Temp:  [97.9 F (36.6 C)-98.6 F (37 C)] 98.6 F (37 C) (09/01 0538) Pulse Rate:  [60-78] 78 (09/01 0538) Resp:  [14-23] 18 (09/01 0538) BP: (114-172)/(43-83) 127/50 mmHg (09/01 0538) SpO2:  [93 %-98 %] 97 % (09/01 0538)  Intake/Output from previous day: 08/31 0701 - 09/01 0700 In: 2060 [P.O.:60; I.V.:2000] Out: 1100 [Urine:800; Blood:300] Intake/Output this shift:    Strength 5 out of 5 wound clean dry and intact  Lab Results: No results found for this basename: WBC, HGB, HCT, PLT,  in the last 72 hours BMET No results found for this basename: NA, K, CL, CO2, GLUCOSE, BUN, CREATININE, CALCIUM,  in the last 72 hours  Studies/Results: Dg Lumbar Spine 2-3 Views  08/24/2014   CLINICAL DATA:  Lumbar fusion L3-4.  EXAM: DG C-ARM 61-120 MIN; LUMBAR SPINE - 2-3 VIEW  TECHNIQUE: Three spot fluoroscopic intraoperative views of the lumbar spine.  CONTRAST:  None.  COMPARISON:  07/09/2014  FINDINGS: Examination demonstrates laminectomy with placement of posterior fusion hardware with pedicle screws at the L3-4 level. Intervertebral cage is present at the L3-4 level. There is spondylosis of the lumbar spine. There is a subtle grade 1 anterolisthesis of L4 on L5 unchanged. Remainder the exam is unchanged.  IMPRESSION: Mild spondylosis of the lumbar spine with subtle stable grade 1 anterolisthesis of L4 on L5. Posterior fusion hardware intact at the L3-4 level with intervertebral cage at the L3-4 level. Recommend correlation with findings at the time of the procedure.   Electronically Signed   By: Elberta Fortis M.D.   On: 08/24/2014 11:52   Dg C-arm 1-60 Min  08/24/2014   CLINICAL DATA:  Lumbar fusion L3-4.  EXAM: DG C-ARM 61-120 MIN; LUMBAR SPINE - 2-3 VIEW  TECHNIQUE: Three spot fluoroscopic intraoperative views of the lumbar spine.   CONTRAST:  None.  COMPARISON:  07/09/2014  FINDINGS: Examination demonstrates laminectomy with placement of posterior fusion hardware with pedicle screws at the L3-4 level. Intervertebral cage is present at the L3-4 level. There is spondylosis of the lumbar spine. There is a subtle grade 1 anterolisthesis of L4 on L5 unchanged. Remainder the exam is unchanged.  IMPRESSION: Mild spondylosis of the lumbar spine with subtle stable grade 1 anterolisthesis of L4 on L5. Posterior fusion hardware intact at the L3-4 level with intervertebral cage at the L3-4 level. Recommend correlation with findings at the time of the procedure.   Electronically Signed   By: Elberta Fortis M.D.   On: 08/24/2014 11:52    Assessment/Plan: Continue flat bedrest and a 24 hours and will start mobilizing the patient  LOS: 1 day     Laksh Hinners P 08/25/2014, 7:54 AM

## 2014-08-26 LAB — GLUCOSE, CAPILLARY
GLUCOSE-CAPILLARY: 95 mg/dL (ref 70–99)
GLUCOSE-CAPILLARY: 105 mg/dL — AB (ref 70–99)
Glucose-Capillary: 98 mg/dL (ref 70–99)
Glucose-Capillary: 127 mg/dL — ABNORMAL HIGH (ref 70–99)
Glucose-Capillary: 181 mg/dL — ABNORMAL HIGH (ref 70–99)

## 2014-08-26 MED ORDER — HYDROMORPHONE HCL 2 MG PO TABS
2.0000 mg | ORAL_TABLET | ORAL | Status: DC | PRN
Start: 2014-08-26 — End: 2014-08-31
  Administered 2014-08-26 – 2014-08-31 (×32): 2 mg via ORAL
  Filled 2014-08-26 (×33): qty 1

## 2014-08-26 MED ORDER — DEXAMETHASONE SODIUM PHOSPHATE 4 MG/ML IJ SOLN
4.0000 mg | Freq: Four times a day (QID) | INTRAMUSCULAR | Status: AC
  Administered 2014-08-26 – 2014-08-27 (×4): 4 mg via INTRAVENOUS
  Filled 2014-08-26 (×4): qty 1

## 2014-08-26 MED ORDER — PANTOPRAZOLE SODIUM 40 MG PO TBEC
40.0000 mg | DELAYED_RELEASE_TABLET | Freq: Two times a day (BID) | ORAL | Status: DC
  Administered 2014-08-26 – 2014-08-31 (×11): 40 mg via ORAL
  Filled 2014-08-26 (×11): qty 1

## 2014-08-26 NOTE — Evaluation (Signed)
Occupational Therapy Evaluation Patient Details Name: April [name] MRN: [id number] DOB: 23-May-1925 Today's Date: 08/26/2014    History of Present Illness 80 y.o. female admitted to [facility] on 08/24/14 for elective PLIF L3-4 with dural tear. Pt with significant PMHx of HTN, DM, lupus, urinary frequency, and L hip arthroplasty 03/2014).    Clinical Impression   Pt s/p above. Pt independent with ADLs, PTA. Feel pt will benefit from acute OT to increase independence prior to d/c. Recommending SNF for rehab unless pt can progress well enough to go home.    Follow Up Recommendations  SNF;Supervision/Assistance - 24 hour    Equipment Recommendations  None recommended by OT    Recommendations for Other Services       Precautions / Restrictions Precautions Precautions: Back;Fall Precaution Comments: Educated on precautions Required Braces or Orthoses: Spinal Brace Spinal Brace: Lumbar corset;Applied in sitting position Restrictions Weight Bearing Restrictions: No      Mobility Bed Mobility Overal bed mobility: Needs Assistance Bed Mobility: Rolling;Sidelying to Sit Rolling: Supervision Sidelying to sit: Max assist     General bed mobility comments: Cues for log roll technique. Assist with trunk to sit EOB.  Transfers Overall transfer level: Needs assistance Equipment used: Rolling walker (2 wheeled) Transfers: Sit to/from [employer] Sit to Stand: Mod assist;Max assist Stand pivot transfers: +2 physical assistance;Max assist       General transfer comment: Cues for technique. +2 assist to pivot to chair. Pt in flexed position despite cues to stand upright.          ADL Overall ADL's : Needs assistance/impaired                 Upper Body Dressing : Moderate assistance;Sitting   Lower Body Dressing: Maximal assistance;Sit to/from stand;With adaptive equipment   Toilet Transfer: Maximal assistance;+2 for physical assistance;Stand-pivot;RW (from  bed to chair)           Functional mobility during ADLs: +2 for physical assistance;Rolling walker;Maximal assistance (stand pivot) General ADL Comments: Practiced with AE for LB ADLs. Educated on back brace wear. Pt donned panties with reacher.      Vision                     Perception     Praxis      Pertinent Vitals/Pain Pain Assessment: 0-10 Pain Score: 5  Pain Location: back Pain Descriptors / Indicators: Throbbing Pain Intervention(s): Repositioned     Hand Dominance Right   Extremity/Trunk Assessment Upper Extremity Assessment Upper Extremity Assessment: Generalized weakness   Lower Extremity Assessment Lower Extremity Assessment: Defer to PT evaluation RLE Deficits / Details: bil leg weakness in standing (flexed and shaking knee posture).   LLE Deficits / Details: bil leg weakness in standing (flexed and shaking knee posture).     Cervical / Trunk Assessment Cervical / Trunk Assessment: Normal   Communication Communication Communication: No difficulties   Cognition Arousal/Alertness: Awake/alert Behavior During Therapy: WFL for tasks assessed/performed Overall Cognitive Status: Within Functional Limits for tasks assessed                     General Comments       Exercises       Shoulder Instructions      Home Living Family/patient expects to be discharged to:: Skilled nursing facility Living Arrangements: Children Available Help at Discharge: Family;Available 24 hours/day Type of Home: House Home Access: Stairs to enter [employer] of Steps: 4 Entrance  Stairs-Rails: Right;Left Home Layout: One level     Bathroom Shower/Tub: Tub/shower unit         Home Equipment: Tub bench;Adaptive equipment Adaptive Equipment: Reacher;Sock aid;Long-handled shoe horn;Long-handled sponge        Prior Functioning/Environment Level of Independence: Independent             OT Diagnosis: Acute pain;Generalized  weakness   OT Problem List: Decreased range of motion;Decreased strength;Decreased activity tolerance;Impaired balance (sitting and/or standing);Decreased knowledge of use of DME or AE;Decreased knowledge of precautions;Pain   OT Treatment/Interventions: Self-care/ADL training;DME and/or AE instruction;Therapeutic activities;Patient/family education;Balance training    OT Goals(Current goals can be found in the care plan section) Acute Rehab OT Goals Patient Stated Goal: get back to what she was doing before OT Goal Formulation: With patient Time For Goal Achievement: 09/02/14 Potential to Achieve Goals: Good ADL Goals Pt Will Perform Grooming: with min guard assist;standing Pt Will Perform Lower Body Dressing: with min assist;sit to/from stand;with adaptive equipment Pt Will Transfer to Toilet: with min assist;ambulating;bedside commode Pt Will Perform Toileting - Clothing Manipulation and hygiene: with min assist;sit to/from stand  OT Frequency: Min 2X/week   Barriers to D/C:            Co-evaluation              End of Session Equipment Utilized During Treatment: Gait belt;Back brace;Rolling walker;Oxygen Nurse Communication: Other (comment) (assisted with transfer)  Activity Tolerance: Patient tolerated treatment well Patient left: in chair;with call bell/phone within reach;with family/visitor present   Time: 1610-9604 OT Time Calculation (min): 29 min Charges:  OT General Charges $OT Visit: 1 Procedure OT Evaluation $Initial OT Evaluation Tier I: 1 Procedure OT Treatments $Self Care/Home Management : 8-22 mins G-CodesEarlie Raveling OTR/L [phone] 08/26/2014, 6:25 PM

## 2014-08-26 NOTE — Progress Notes (Signed)
Subjective: Patient reports overall she's doing okay original sore in her lower abdomen left leg and back. No numbness or tingling.  Objective: Vital signs in last 24 hours: Temp:  [97.7 F (36.5 C)-98.3 F (36.8 C)] 98.3 F (36.8 C) (09/02 0513) Pulse Rate:  [62-80] 79 (09/02 0513) Resp:  [16-18] 18 (09/02 0513) BP: (115-148)/(40-86) 122/50 mmHg (09/02 0513) SpO2:  [95 %-99 %] 95 % (09/02 0513) Weight:  [86.4 kg (190 lb 7.6 oz)] 86.4 kg (190 lb 7.6 oz) (09/01 0948)  Intake/Output from previous day: 09/01 0701 - 09/02 0700 In: -  Out: 1000 [Urine:1000] Intake/Output this shift:    Strength out of 5 wound clean and dry  Lab Results: No results found for this basename: WBC, HGB, HCT, PLT,  in the last 72 hours BMET No results found for this basename: NA, K, CL, CO2, GLUCOSE, BUN, CREATININE, CALCIUM,  in the last 72 hours  Studies/Results: Dg Lumbar Spine 2-3 Views  08/24/2014   CLINICAL DATA:  Lumbar fusion L3-4.  EXAM: DG C-ARM 61-120 MIN; LUMBAR SPINE - 2-3 VIEW  TECHNIQUE: Three spot fluoroscopic intraoperative views of the lumbar spine.  CONTRAST:  None.  COMPARISON:  07/09/2014  FINDINGS: Examination demonstrates laminectomy with placement of posterior fusion hardware with pedicle screws at the L3-4 level. Intervertebral cage is present at the L3-4 level. There is spondylosis of the lumbar spine. There is a subtle grade 1 anterolisthesis of L4 on L5 unchanged. Remainder the exam is unchanged.  IMPRESSION: Mild spondylosis of the lumbar spine with subtle stable grade 1 anterolisthesis of L4 on L5. Posterior fusion hardware intact at the L3-4 level with intervertebral cage at the L3-4 level. Recommend correlation with findings at the time of the procedure.   Electronically Signed   By: Elberta Fortis M.D.   On: 08/24/2014 11:52   Dg C-arm 1-60 Min  08/24/2014   CLINICAL DATA:  Lumbar fusion L3-4.  EXAM: DG C-ARM 61-120 MIN; LUMBAR SPINE - 2-3 VIEW  TECHNIQUE: Three spot  fluoroscopic intraoperative views of the lumbar spine.  CONTRAST:  None.  COMPARISON:  07/09/2014  FINDINGS: Examination demonstrates laminectomy with placement of posterior fusion hardware with pedicle screws at the L3-4 level. Intervertebral cage is present at the L3-4 level. There is spondylosis of the lumbar spine. There is a subtle grade 1 anterolisthesis of L4 on L5 unchanged. Remainder the exam is unchanged.  IMPRESSION: Mild spondylosis of the lumbar spine with subtle stable grade 1 anterolisthesis of L4 on L5. Posterior fusion hardware intact at the L3-4 level with intervertebral cage at the L3-4 level. Recommend correlation with findings at the time of the procedure.   Electronically Signed   By: Elberta Fortis M.D.   On: 08/24/2014 11:52    Assessment/Plan: Slowly razor has been mobilize with physical therapy  LOS: 2 days     April [name] P 08/26/2014, 8:54 AM

## 2014-08-26 NOTE — Progress Notes (Signed)
Patient with PT order this AM with no team assign. Spoke to [place] from physical therapy. Per Toniann Fail, she will have someone see patient today or early tomorrow morning.   Sim Boast, RN

## 2014-08-26 NOTE — Evaluation (Signed)
Physical Therapy Evaluation Patient Details Name: April [name] MRN: [id number] DOB: Sep 25, 1925 Today's Date: 08/26/2014   History of Present Illness  80 y.o. female admitted to [facility] on 08/24/14 for elective PLIF L3-4 with dural tear. Pt with significant PMHx of HTN, DM, lupus, urinary frequency, and L hip arthroplasty 03/2014).   Clinical Impression  Pt is off of bedrest and is moving with mod assist and RW to stand and pivot to the recliner chair.  She will likely need two person assist to attempt gait. Currently I am recommending SNF placement for rehab before she returns home.   PT to follow acutely for deficits listed below.       Follow Up Recommendations SNF    Equipment Recommendations  None recommended by PT    Recommendations for Other Services   NA    Precautions / Restrictions Precautions Precautions: Back;Fall Precaution Comments: pt is very weak in bil legs.  Verbally reviewed back precautions and reverse log roll to get back into bed.  Required Braces or Orthoses: Spinal Brace Spinal Brace: Lumbar corset;Applied in sitting position      Mobility  Bed Mobility Overal bed mobility: Needs Assistance Bed Mobility: Sit to Sidelying;Rolling Rolling: Modified independent (Device/Increase time)       Sit to sidelying: Mod assist General bed mobility comments: rolling to get positioned in the bed with mod I using railing, verbal cues for flexed knees.  Sit to sidelying pt needed help getting both legs up into the bed.  Verbal cues for reverse log roll technique to protect back while getting back into bed.   Transfers Overall transfer level: Needs assistance Equipment used: Rolling walker (2 wheeled) Transfers: Sit to/from [employer] Sit to Stand: Min assist Stand pivot transfers: Mod assist       General transfer comment: Min assist to get to standing from low recliner chair with verbal cues for safe hand placement.  Pt standing over weak and  flexed knees even with RW support. Mod assist to move her feet and take a few pivotal steps to bed from recliner with RW.  Pt very weak, bil legs buckling and relying heavily on upper extremity support.   Ambulation/Gait             General Gait Details: Not tested, not safe without two person assist at this time.          Balance Overall balance assessment: Needs assistance Sitting-balance support: Feet supported;No upper extremity supported Sitting balance-Leahy Scale: Good     Standing balance support: No upper extremity supported Standing balance-Leahy Scale: Poor Standing balance comment: Needs mod assist for dynamic movements and cannot stand statically without support.                              Pertinent Vitals/Pain Pain Assessment: 0-10 Pain Score: 5  Pain Location: low back Pain Descriptors / Indicators: Aching;Burning Pain Intervention(s): Repositioned;Monitored during session;Limited activity within patient's tolerance    Home Living Family/patient expects to be discharged to:: Skilled nursing facility                            Hand Dominance   Dominant Hand: Right    Extremity/Trunk Assessment   Upper Extremity Assessment: Defer to OT evaluation           Lower Extremity Assessment: RLE deficits/detail;LLE deficits/detail RLE Deficits / Details: bil leg  weakness in standing (flexed and shaking knee posture).   LLE Deficits / Details: bil leg weakness in standing (flexed and shaking knee posture).    Cervical / Trunk Assessment: Normal  Communication   Communication: No difficulties  Cognition Arousal/Alertness: Awake/alert Behavior During Therapy: WFL for tasks assessed/performed Overall Cognitive Status: Within Functional Limits for tasks assessed                      General Comments General comments (skin integrity, edema, etc.): Kept O2 on for the entire session.           Assessment/Plan    PT  Assessment Patient needs continued PT services  PT Diagnosis Difficulty walking;Abnormality of gait;Generalized weakness;Acute pain   PT Problem List Decreased strength;Decreased activity tolerance;Decreased balance;Decreased mobility;Decreased knowledge of use of DME;Decreased knowledge of precautions;Pain  PT Treatment Interventions DME instruction;Gait training;Stair training;Functional mobility training;Therapeutic activities;Therapeutic exercise;Balance training;Neuromuscular re-education;Patient/family education;Modalities   PT Goals (Current goals can be found in the Care Plan section) Acute Rehab PT Goals Patient Stated Goal: to get stronger and go home PT Goal Formulation: With patient Time For Goal Achievement: 09/02/14 Potential to Achieve Goals: Good    Frequency Min 5X/week           End of Session Equipment Utilized During Treatment: Back brace;Oxygen Activity Tolerance: Patient limited by fatigue;Patient limited by pain Patient left: in bed;with call bell/phone within reach Nurse Communication: Mobility status         Time: 1725-1740 PT Time Calculation (min): 15 min   Charges:   PT Evaluation $Initial PT Evaluation Tier I: 1 Procedure PT Treatments $Therapeutic Activity: 8-22 mins        Zaelyn Noack B. Hisae Decoursey, PT, DPT [phone]   08/26/2014, 5:56 PM

## 2014-08-26 NOTE — Progress Notes (Signed)
Patient with order for bedrest with head elevation until in the evening today. Foley catheter remains per order until patient able to ambulate. Will remove once PT assessment. Foley and peri care completed.  Sim Boast, RN

## 2014-08-27 LAB — GLUCOSE, CAPILLARY
GLUCOSE-CAPILLARY: 212 mg/dL — AB (ref 70–99)
GLUCOSE-CAPILLARY: 228 mg/dL — AB (ref 70–99)
Glucose-Capillary: 152 mg/dL — ABNORMAL HIGH (ref 70–99)
Glucose-Capillary: 236 mg/dL — ABNORMAL HIGH (ref 70–99)

## 2014-08-27 NOTE — Progress Notes (Signed)
Physical Therapy Treatment Patient Details Name: April [name] MRN: [id number] DOB: 05-Aug-1925 Today's Date: 08/27/2014    History of Present Illness 80 y.o. female admitted to [facility] on 08/24/14 for elective PLIF L3-4 with dural tear. Pt with significant PMHx of HTN, DM, lupus, urinary frequency, and L hip arthroplasty 03/2014).     PT Comments    Pt is progressing well with her mobility today, min/mod one person assist with limited gait distance.  I still think at her current functioning she would be most appropriate for SNF level rehab, but if she continues to progress like she did today, home could still be an option.  Pt will follow acutely and update d/c recs as needed.    Follow Up Recommendations  SNF     Equipment Recommendations  None recommended by PT    Recommendations for Other Services   NA     Precautions / Restrictions Precautions Precautions: Back;Fall Precaution Booklet Issued: Yes (comment) Precaution Comments: pt able to report 2/3 back precautions, handout given for reinforcement.  Required Braces or Orthoses: Spinal Brace Spinal Brace: Lumbar corset;Applied in sitting position Restrictions Weight Bearing Restrictions: No    Mobility  Bed Mobility Overal bed mobility: Needs Assistance Bed Mobility: Sit to Sidelying         Sit to sidelying: Min assist General bed mobility comments: Min assist to lift legs into the bed.  Pt remembered to go to her side, but needed cues for hand placement on the bed flat with no rails to avoid twisting.   Transfers Overall transfer level: Needs assistance Equipment used: Rolling walker (2 wheeled) Transfers: Sit to/from Stand Sit to Stand: +2 physical assistance;Min assist         General transfer comment: Two person min assist used, but likely only one assist needed.  Pt using armrests appropriately to push up from the chair.   Ambulation/Gait Ambulation/Gait assistance: Min assist;Mod assist Ambulation  Distance (Feet): 20 Feet Assistive device: Rolling walker (2 wheeled) Gait Pattern/deviations: Step-through pattern;Shuffle Gait velocity: decreased Gait velocity interpretation: <1.8 ft/sec, indicative of risk for recurrent falls General Gait Details: Pt with shuffling gait pattern.  She reports her left leg is the weakest and most painful leg during gait. Verbal cues for upright posture and for safe use of RW (stay inside).  Pt with limited gait tolerance, and increased knee flexion as she fatigues.  Mod assist at the end of gait to support trunk over weak legs.           Balance Overall balance assessment: Needs assistance Sitting-balance support: Feet supported;No upper extremity supported Sitting balance-Leahy Scale: Good     Standing balance support: Bilateral upper extremity supported Standing balance-Leahy Scale: Poor Standing balance comment: needs external support to maintain balance.                     Cognition Arousal/Alertness: Awake/alert Behavior During Therapy: WFL for tasks assessed/performed Overall Cognitive Status: Within Functional Limits for tasks assessed                             Pertinent Vitals/Pain Pain Assessment: 0-10 Pain Score: 8  Pain Location: back, bil legs Pain Descriptors / Indicators: Aching;Burning Pain Intervention(s): Limited activity within patient's tolerance;Monitored during session;Repositioned;Patient requesting pain meds-RN notified           PT Goals (current goals can now be found in the care plan section) Acute Rehab PT Goals  Patient Stated Goal: get back to what she was doing before Progress towards PT goals: Progressing toward goals    Frequency  Min 5X/week    PT Plan Current plan remains appropriate       End of Session Equipment Utilized During Treatment: Back brace Activity Tolerance: Patient limited by fatigue;Patient limited by pain Patient left: in bed;with call bell/phone within  reach     Time: 0949-1000 PT Time Calculation (min): 11 min  Charges:  $Gait Training: 8-22 mins            Dariyon Urquilla B. Laquana Villari, PT, DPT [phone]   08/27/2014, 10:11 AM

## 2014-08-27 NOTE — Progress Notes (Signed)
Subjective: Patient reports Overall doing would better pain is still severe but much better than yesterday she is sitting on the side that she ambulate a few steps yesterday  Objective: Vital signs in last 24 hours: Temp:  [97.4 F (36.3 C)-99.2 F (37.3 C)] 97.4 F (36.3 C) (09/03 0517) Pulse Rate:  [67-82] 70 (09/03 0517) Resp:  [16-18] 18 (09/03 0517) BP: (136-156)/(41-60) 145/51 mmHg (09/03 0517) SpO2:  [95 %-98 %] 98 % (09/03 0517)  Intake/Output from previous day: 09/02 0701 - 09/03 0700 In: 2238.3 [P.O.:840; I.V.:1398.3] Out: 5750 [Urine:5750] Intake/Output this shift:    Strength out of 5 wound clean and dry  Lab Results: No results found for this basename: WBC, HGB, HCT, PLT,  in the last 72 hours BMET No results found for this basename: NA, K, CL, CO2, GLUCOSE, BUN, CREATININE, CALCIUM,  in the last 72 hours  Studies/Results: No results found.  Assessment/Plan: Continue mobilizes physical and occupational therapy  LOS: 3 days     April [name] P 08/27/2014, 7:34 AM

## 2014-08-27 NOTE — Progress Notes (Signed)
Pt attempted to urinate twice to [employer] 2 assist, unsuccessfully getting more than out. Bladder scan after second attempt showed >63ml. In and out cath performed, drained 650 ml, pt tolerated well. Will continue to monitor.

## 2014-08-28 DIAGNOSIS — M47817 Spondylosis without myelopathy or radiculopathy, lumbosacral region: Secondary | ICD-10-CM

## 2014-08-28 DIAGNOSIS — IMO0002 Reserved for concepts with insufficient information to code with codable children: Secondary | ICD-10-CM

## 2014-08-28 LAB — GLUCOSE, CAPILLARY
GLUCOSE-CAPILLARY: 161 mg/dL — AB (ref 70–99)
GLUCOSE-CAPILLARY: 93 mg/dL (ref 70–99)
Glucose-Capillary: 91 mg/dL (ref 70–99)
Glucose-Capillary: 104 mg/dL — ABNORMAL HIGH (ref 70–99)

## 2014-08-28 NOTE — Progress Notes (Signed)
Physical Therapy Treatment Patient Details Name: April [name] MRN: [id number] DOB: 08-30-1925 Today's Date: 08/28/2014    History of Present Illness 80 y.o. female admitted to [facility] on 08/24/14 for elective PLIF L3-4 with dural tear. Pt with significant PMHx of HTN, DM, lupus, urinary frequency, and L hip arthroplasty 03/2014).     PT Comments    Pt with improved ambulation tolerance however con't to require assist for safe transfer and ambulation. Pt to benefit from CIR to address mentioned deficits and achieve safe level of function for transition home. Pt with good support at home that will provide 24/7 assist. Pt reports having gone to CIR in April s/p ORIF of L fractured hip and had great success.  Follow Up Recommendations  CIR     Equipment Recommendations  None recommended by PT    Recommendations for Other Services       Precautions / Restrictions Precautions Precautions: Back;Fall Precaution Booklet Issued: Yes (comment) Precaution Comments: pt able to recall 3/3 precatuions Required Braces or Orthoses: Spinal Brace Spinal Brace: Lumbar corset;Applied in sitting position Restrictions Weight Bearing Restrictions: No    Mobility  Bed Mobility Overal bed mobility:  (pt up in chair upon PT arrival)                Transfers Overall transfer level: Needs assistance Equipment used: Rolling walker (2 wheeled) Transfers: Sit to/from Stand Sit to Stand: Min assist         General transfer comment: v/c's for hand placement, minA to initiate anterior weight shift  Ambulation/Gait Ambulation/Gait assistance: Min assist Ambulation Distance (Feet): 30 Feet (x2) Assistive device: Rolling walker (2 wheeled) Gait Pattern/deviations: Step-through pattern;Decreased stride length;Antalgic Gait velocity: decreased Gait velocity interpretation: <1.8 ft/sec, indicative of risk for recurrent falls General Gait Details: Pt with L LE pain and weakness from previous  fractured hip in april. Pt with increased bilat UE WBing. pt quickly fatigued   Stairs            Wheelchair Mobility    Modified Rankin (Stroke Patients Only)       Balance           Standing balance support: Bilateral upper extremity supported Standing balance-Leahy Scale: Poor Standing balance comment: needs external support to maintain balance   Pt did stand to without UE assist to pull up underwear but required min guard/minA to maintain balance.                  Cognition Arousal/Alertness: Awake/alert Behavior During Therapy: WFL for tasks assessed/performed Overall Cognitive Status: Within Functional Limits for tasks assessed                      Exercises      General Comments        Pertinent Vitals/Pain Pain Assessment: 0-10 Pain Score: 5  Pain Location: back and L LE Pain Descriptors / Indicators: Aching;Burning Pain Intervention(s): Monitored during session (RN provided pain medication)    Home Living                      Prior Function            PT Goals (current goals can now be found in the care plan section) Progress towards PT goals: Progressing toward goals    Frequency  Min 5X/week    PT Plan Current plan remains appropriate    Co-evaluation  End of Session Equipment Utilized During Treatment: Back brace Activity Tolerance: Patient limited by fatigue;Patient limited by pain Patient left: in chair;with call bell/phone within reach;with family/visitor present     Time: 0920-0943 PT Time Calculation (min): 23 min  Charges:  $Gait Training: 23-37 mins                    G Codes:      April [name] 08/28/2014, 9:59 AM  April [name], PT, DPT Pager #: [phone] Office #: [phone]

## 2014-08-28 NOTE — Progress Notes (Signed)
UR complete.  Kobey Sides RN, MSN 

## 2014-08-28 NOTE — Consult Note (Signed)
Physical Medicine and Rehabilitation Consult   Reason for Consult: L3/4 spinal stenosis with BLE radiculopathy Referring Physician:  Dr. Wynetta Emery    HPI: April [name] is a 80 y.o. female with h/o HTN, DM type 2, fall at work 03/2104 with hip fracture--CIR stay with progress to supervision level. She has had progressive worsening back and bilateral leg pain rating down to the front of her shin adjacent of her foot consistent with predominately and L4 nerve root pattern. Workup revealed severe spinal stenosis with virtually complete CSF block at L3-4 with a virtually autofused L5-S1 and patient elected to undergo decompressive lam L3/4 with fusion and repair of inadvertent dural tear by Dr. Wynetta Emery on 08/24/14. Post op on bedrest till 08/26/14. She has problems with urinary retention. LE weakness as well as pain affecting mobility. MD recommending CIR for follow up therapy.   Patient reports that she has been sedentary since d/c from rehab. Has continued to require walker due to left hip and back pain. Independent for ADLs as well as mobility with RW.    Review of Systems  Constitutional: Positive for malaise/fatigue.  HENT: Negative for hearing loss.   Eyes: Positive for blurred vision.  Respiratory: Negative for cough and shortness of breath.   Cardiovascular: Negative for chest pain and palpitations.  Gastrointestinal: Positive for abdominal pain. Negative for heartburn.  Musculoskeletal: Positive for back pain, joint pain (chronic left hip pain due to Fx) and myalgias.  Neurological: Positive for weakness. Negative for dizziness and headaches.  Psychiatric/Behavioral: The patient is nervous/anxious.      Past Medical History  Diagnosis Date  . Hyperlipidemia 04/07/2014    takes Lovastatin daily  . OA (osteoarthritis) 04/07/2014  . Hypertension     takes Amlodipine and Atenolol daily  . HTN (hypertension) 04/07/2014  . Diabetes mellitus without complication     takes Lantus  daily  . Hypothyroidism 04/07/2014    takes Synthroid daily  . History of bronchitis 20 yrs ago  . History of lupus 37yrs ago  . Joint pain   . Peripheral edema   . UTI (lower urinary tract infection)     completed antibioitcs on 07/28/14  . Urinary frequency     takes Vesicare daily  . Chronic back pain     stenosis  . Constipation     takes an OTC stool softener   . GERD (gastroesophageal reflux disease)     not on Protonix daily  . Nocturia   . Cataract     left eye  . History of shingles     Past Surgical History  Procedure Laterality Date  . Cholecystectomy    . Hip arthroplasty Left 04/08/2014    Procedure: LEFT HIP HEMIARTHROPLASTY;  Surgeon: Verlee Rossetti, MD;  Location: [facility] OR;  Service: Orthopedics;  Laterality: Left;  . Tonsillectomy    . Adenoidectomy    . Left cataract removed    . Eye surgery      History reviewed. No pertinent family history.  Social History:  Daughter lives with patient. Patient was working prior to fall 03/2014. She reports that she has never smoked. She has never used smokeless tobacco. She reports that she does not drink alcohol or use illicit drugs.  Allergies: No Known Allergies  Medications Prior to Admission  Medication Sig Dispense Refill  . acidophilus (RISAQUAD) CAPS capsule Take 1 capsule by mouth daily.      Marland Kitchen alendronate (FOSAMAX) 70 MG tablet Take 70 mg  by mouth once a week. Take with a full glass of water on an empty stomach.      Marland Kitchen amLODipine (NORVASC) 10 MG tablet Take 1 tablet (10 mg total) by mouth daily.  30 tablet  1  . atenolol (TENORMIN) 100 MG tablet Take 1 tablet (100 mg total) by mouth daily.  30 tablet  1  . conjugated estrogens (PREMARIN) vaginal cream Place 1 Applicatorful vaginally every other day. Uses Monday, Wednesday, and Friday      . CRANBERRY PO Take 1 tablet by mouth daily.      Marland Kitchen docusate sodium (COLACE) 100 MG capsule Take 200 mg by mouth daily.      Marland Kitchen gemfibrozil (LOPID) 600 MG tablet Take 1 tablet  (600 mg total) by mouth 2 (two) times daily before a meal.  60 tablet  1  . HYDROcodone-acetaminophen (NORCO/VICODIN) 5-325 MG per tablet Take 1-2 tablets by mouth every 4 (four) hours as needed for moderate pain.      Marland Kitchen insulin glargine (LANTUS) 100 UNIT/ML injection Inject 10 Units into the skin at bedtime.       Marland Kitchen levothyroxine (SYNTHROID, LEVOTHROID) 125 MCG tablet Take 1 tablet (125 mcg total) by mouth daily before breakfast.  30 tablet  1  . lisinopril (PRINIVIL,ZESTRIL) 40 MG tablet Take 1 tablet (40 mg total) by mouth daily.  30 tablet  1  . lovastatin (MEVACOR) 40 MG tablet Take 40 mg by mouth daily.      . mupirocin ointment (BACTROBAN) 2 % Place 1 application into the nose 2 (two) times daily. For 5 days      . Vitamin D, Ergocalciferol, (DRISDOL) 50000 UNITS CAPS capsule Take 50,000 Units by mouth as needed.         Home: Home Living Family/patient expects to be discharged to:: Skilled nursing facility Living Arrangements: Children Available Help at Discharge: Family;Available 24 hours/day Type of Home: House Home Access: Stairs to enter [employer] of Steps: 4 Entrance Stairs-Rails: Right;Left Home Layout: One level Home Equipment: Tub bench;Adaptive equipment Adaptive Equipment: Reacher;Sock aid;Long-handled shoe horn;Long-handled sponge  Functional History: Prior Function Level of Independence: Independent Functional Status:  Mobility: Bed Mobility Overal bed mobility: Needs Assistance Bed Mobility: Sit to Sidelying Rolling: Supervision Sidelying to sit: Max assist Sit to sidelying: Min assist General bed mobility comments: Min assist to lift legs into the bed.  Pt remembered to go to her side, but needed cues for hand placement on the bed flat with no rails to avoid twisting.  Transfers Overall transfer level: Needs assistance Equipment used: Rolling walker (2 wheeled) Transfers: Sit to/from Stand Sit to Stand: +2 physical assistance;Min assist Stand  pivot transfers: +2 physical assistance;Max assist General transfer comment: Two person min assist used, but likely only one assist needed.  Pt using armrests appropriately to push up from the chair.  Ambulation/Gait Ambulation/Gait assistance: Min assist;Mod assist Ambulation Distance (Feet): 20 Feet Assistive device: Rolling walker (2 wheeled) Gait Pattern/deviations: Step-through pattern;Shuffle Gait velocity: decreased Gait velocity interpretation: <1.8 ft/sec, indicative of risk for recurrent falls General Gait Details: Pt with shuffling gait pattern.  She reports her left leg is the weakest and most painful leg during gait. Verbal cues for upright posture and for safe use of RW (stay inside).  Pt with limited gait tolerance, and increased knee flexion as she fatigues.  Mod assist at the end of gait to support trunk over weak legs.     ADL: ADL Overall ADL's : Needs assistance/impaired Upper Body  Dressing : Moderate assistance;Sitting Lower Body Dressing: Maximal assistance;Sit to/from stand;With adaptive equipment Toilet Transfer: Maximal assistance;+2 for physical assistance;Stand-pivot;RW (from bed to chair) Functional mobility during ADLs: +2 for physical assistance;Rolling walker;Maximal assistance (stand pivot) General ADL Comments: Practiced with AE for LB ADLs. Educated on back brace wear. Pt donned panties with reacher.   Cognition: Cognition Overall Cognitive Status: Within Functional Limits for tasks assessed Orientation Level: Oriented X4 Cognition Arousal/Alertness: Awake/alert Behavior During Therapy: WFL for tasks assessed/performed Overall Cognitive Status: Within Functional Limits for tasks assessed  Blood pressure 129/44, pulse 56, temperature 97.8 F (36.6 C), temperature source Oral, resp. rate 16, height  (1.651 m), weight 86.4 kg (190 lb 7.6 oz), SpO2 100.00%. Physical Exam  Nursing note and vitals reviewed. Constitutional: She is oriented to person,  place, and time. She appears well-developed and well-nourished.  HENT:  Head: Normocephalic and atraumatic.  Eyes: Conjunctivae are normal. Pupils are equal, round, and reactive to light.  Neck: Normal range of motion. Neck supple.  Cardiovascular: Normal rate and regular rhythm.   Respiratory: Effort normal and breath sounds normal. No respiratory distress. She has no wheezes.  GI: Soft. Bowel sounds are normal. She exhibits no distension. There is no tenderness.  Musculoskeletal: She exhibits no edema.  Back pain with SLR BLE. Left hip pain with ROM at hip.   Neurological: She is alert and oriented to person, place, and time. No cranial nerve deficit. Coordination normal.  Good sitting balance. Mild weakness in left quads, hip adductors. Pain in L3 distribution, left leg. No focal sensory loss is appreciated however.   Skin: Skin is warm and dry.  Psychiatric: She has a normal mood and affect. Her behavior is normal. Thought content normal.    Results for orders placed during the hospital encounter of 08/24/14 (from the past 24 hour(s))  GLUCOSE, CAPILLARY     Status: Abnormal   Collection Time    08/27/14 11:24 AM      Result Value Ref Range   Glucose-Capillary 212 (*) 70 - 99 mg/dL   Comment 1 Documented in Chart     Comment 2 Notify RN    GLUCOSE, CAPILLARY     Status: Abnormal   Collection Time    08/27/14  4:54 PM      Result Value Ref Range   Glucose-Capillary 228 (*) 70 - 99 mg/dL   Comment 1 Documented in Chart     Comment 2 Notify RN    GLUCOSE, CAPILLARY     Status: Abnormal   Collection Time    08/27/14  9:36 PM      Result Value Ref Range   Glucose-Capillary 236 (*) 70 - 99 mg/dL   Comment 1 Documented in Chart     Comment 2 Notify RN    GLUCOSE, CAPILLARY     Status: None   Collection Time    08/28/14  6:46 AM      Result Value Ref Range   Glucose-Capillary 91  70 - 99 mg/dL   Comment 1 Documented in Chart     Comment 2 Notify RN     No results  found.  Assessment/Plan: Diagnosis: Left Lumbar 3 radiculopathy/stenosis s/p L3-4 laminectomy and fusion,  1. Does the need for close, 24 hr/day medical supervision in concert with the patient's rehab needs make it unreasonable for this patient to be served in a less intensive setting? Yes 2. Co-Morbidities requiring supervision/potential complications: htn, cm, oa, hx of left hip fx 3. Due  to bladder management, bowel management, safety, skin/wound care, disease management, medication administration, pain management and patient education, does the patient require 24 hr/day rehab nursing? Yes 4. Does the patient require coordinated care of a physician, rehab nurse, PT (1-2 hrs/day, 5 days/week) and OT (1-2 hrs/day, 5 days/week) to address physical and functional deficits in the context of the above medical diagnosis(es)? Yes Addressing deficits in the following areas: balance, endurance, locomotion, strength, transferring, bowel/bladder control, bathing, dressing, feeding, grooming and toileting 5. Can the patient actively participate in an intensive therapy program of at least 3 hrs of therapy per day at least 5 days per week? Yes 6. The potential for patient to make measurable gains while on inpatient rehab is excellent 7. Anticipated functional outcomes upon discharge from inpatient rehab are modified independent  with PT, modified independent with OT, modified independent with SLP. 8. Estimated rehab length of stay to reach the above functional goals is: 7 days 9. Does the patient have adequate social supports to accommodate these discharge functional goals? Yes 10. Anticipated D/C setting: Home 11. Anticipated post D/C treatments: HH therapy 12. Overall Rehab/Functional Prognosis: excellent  RECOMMENDATIONS: This patient's condition is appropriate for continued rehabilitative care in the following setting: CIR Patient has agreed to participate in recommended program. Yes Note that insurance  prior authorization may be required for reimbursement for recommended care.  Comment: Rehab Admissions Coordinator to follow up.  Thanks,  Ranelle Oyster, MD, [place]     08/28/2014

## 2014-08-28 NOTE — Progress Notes (Signed)
Rehab Admissions Coordinator Note:  Patient was screened by Trish Mage for appropriateness for an Inpatient Acute Rehab Consult.  At this time, an inpatient rehab consult has already been ordered and is pending completion.   Lelon Frohlich M 08/28/2014, 12:19 PM  I can be reached at [phone].

## 2014-08-28 NOTE — Progress Notes (Signed)
Results for KEYONNA, COMUNALE (MRN [id number]) as of 08/28/2014 09:45  Ref. Range 08/27/2014 06:28 08/27/2014 11:24 08/27/2014 16:54 08/27/2014 21:36 08/28/2014 06:46  Glucose-Capillary Latest Range: 70-99 mg/dL 829 (H) 562 (H) 130 (H) 236 (H) 91  CBGs running greater than 180 mg/dl yesterday.  Fasting is 91 mg/dl.  On Lantus 35 units daily. If future CBGs tend to run greater than 180 mg/dl, recommend starting Novolog SENSITIVE correction scale TID & HS while in the hospital. Smith Mince RN BSN CDE

## 2014-08-28 NOTE — Progress Notes (Signed)
Subjective: Patient reports Feeling better less pain increase strength  Objective: Vital signs in last 24 hours: Temp:  [97.6 F (36.4 C)-98.2 F (36.8 C)] 97.8 F (36.6 C) (09/04 0546) Pulse Rate:  [56-72] 56 (09/04 0546) Resp:  [16-18] 16 (09/04 0546) BP: (100-158)/(34-50) 129/44 mmHg (09/04 0546) SpO2:  [95 %-100 %] 100 % (09/04 0546)  Intake/Output from previous day: 09/03 0701 - 09/04 0700 In: 900 [P.O.:900] Out: -  Intake/Output this shift:    Strength out of 5 wound clean dry and intact  Lab Results: No results found for this basename: WBC, HGB, HCT, PLT,  in the last 72 hours BMET No results found for this basename: NA, K, CL, CO2, GLUCOSE, BUN, CREATININE, CALCIUM,  in the last 72 hours  Studies/Results: No results found.  Assessment/Plan: Continue to work with PT and OT mobilized weekend rehabilitation consult  LOS: 4 days     April [name] P 08/28/2014, 8:44 AM

## 2014-08-28 NOTE — Clinical Social Work Note (Signed)
Clinical Social Work Department BRIEF PSYCHOSOCIAL ASSESSMENT 08/28/2014  Patient:  April [name], April [name]     Account Number:  [id number]     Admit date:  08/24/2014  Clinical Social Worker:  Delrae Sawyers  Date/Time:  08/28/2014 10:54 AM  Referred by:  Physician  Date Referred:  08/28/2014 Referred for  SNF Placement   Other Referral:   none.   Interview type:  Patient Other interview type:   [name], April [name] and April [name], also present at bedside.    PSYCHOSOCIAL DATA Living Status:  FAMILY Admitted from facility:   Level of care:   Primary support name:  [name] Primary support relationship to patient:  CHILD, ADULT Degree of support available:   Strong support system.    CURRENT CONCERNS Current Concerns  Post-Acute Placement   Other Concerns:   none.    SOCIAL WORK ASSESSMENT / PLAN CSW received referral for SNF placement at time of discharge. CSW met with pt at bedside to discuss discharge disposition. Pt stated she is from home with her daughter, April [name]. [name] stated they would NOT like for pt to be discharged to SNF. [name] stated preference for either CIR or pt to return home with home health services. CSW discussed recommendation for SNF placement and offered to provide pt and pt's daughter with list of available SNF placement. Pt and pt's daughters refused CSW to pursue SNF placement.    CSW updated RNCM regarding information above. CSW signing off.   Assessment/plan status:  Psychosocial Support/Ongoing Assessment of Needs Other assessment/ plan:   none.   Information/referral to community resources:   Pt and pt's daughters refusing SNF placement.    PATIENT'S/FAMILY'S RESPONSE TO PLAN OF CARE: Pt and pt's daughter refusing SNF placement. CSW signing off.       Lubertha Sayres, MSW, [facility] Licensed Clinical Social Worker [phone] and [phone]

## 2014-08-28 NOTE — Care Management Note (Addendum)
  Page 1 of 1   08/28/2014     2:36:03 PM CARE MANAGEMENT NOTE 08/28/2014  Patient:  April [name], April [name]   Account Number:  [id number]  Date Initiated:  08/25/2014  Documentation initiated by:  Jiles Crocker  Subjective/Objective Assessment:   ADMITTED FOR SURGERY     Action/Plan:   CM FOLLOWING FOR DCP/ WORKERS COMP CASE   Anticipated DC Date:  08/29/2014   Anticipated DC Plan:  HOME W HOME HEALTH SERVICES      DC Planning Services  CM consult      Choice offered to / List presented to:             Status of service:  In process, will continue to follow Medicare Important Message given?   (If response is "NO", the following Medicare IM given date fields will be blank) Date Medicare IM given:   Medicare IM given by:   Date Additional Medicare IM given:   Additional Medicare IM given by:    Discharge Disposition:    Per UR Regulation:  Reviewed for med. necessity/level of care/duration of stay  If discussed at Long Length of Stay Meetings, dates discussed:    Comments:  08/28/14 1415 Elmer Bales RN, MSN, CM- Spoke with April [name], worker's comp CM, regarding discharge disposition.  April [name] is aware that patient is being evaluated for CIR and states that April [name] [employer]) has already given approval for CIR. This information was shared with April [name], CIR liason.   08/28/14 1115 Elmer Bales RN, MSN, CM- Attempted to reach patient's worker's comp CM Dorette Grate regarding discharge planning needs. Voicemail left [phone].   9/1/2015Abelino Derrick RN,BSN,MHA [phone]

## 2014-08-29 LAB — GLUCOSE, CAPILLARY
GLUCOSE-CAPILLARY: 73 mg/dL (ref 70–99)
GLUCOSE-CAPILLARY: 72 mg/dL (ref 70–99)
GLUCOSE-CAPILLARY: 127 mg/dL — AB (ref 70–99)
Glucose-Capillary: 54 mg/dL — ABNORMAL LOW (ref 70–99)
Glucose-Capillary: 64 mg/dL — ABNORMAL LOW (ref 70–99)
Glucose-Capillary: 129 mg/dL — ABNORMAL HIGH (ref 70–99)
Glucose-Capillary: 108 mg/dL — ABNORMAL HIGH (ref 70–99)

## 2014-08-29 MED ORDER — SENNA 8.6 MG PO TABS
1.0000 | ORAL_TABLET | Freq: Two times a day (BID) | ORAL | Status: DC
  Administered 2014-08-29 – 2014-08-31 (×5): 8.6 mg via ORAL
  Filled 2014-08-29 (×5): qty 1

## 2014-08-29 NOTE — Progress Notes (Signed)
Physical Therapy Treatment Patient Details Name: April [name] MRN: [id number] DOB: Oct 03, 1925 Today's Date: 08/29/2014    History of Present Illness 80 y.o. female admitted to [facility] on 08/24/14 for elective PLIF L3-4 with dural tear. Pt with significant PMHx of HTN, DM, lupus, urinary frequency, and L hip arthroplasty 03/2014).     PT Comments    Pt was limited today by increased sensation of weakness due to earlier low blood sugars (she has now eaten breakfast).  She continues to report left leg pain and weakness as well as normal incisional back pain.  She would benefit from CIR level therapies prior to returning home.  PT will continue to follow acutely.    Follow Up Recommendations  CIR     Equipment Recommendations  None recommended by PT    Recommendations for Other Services   NA     Precautions / Restrictions Precautions Precautions: Back;Fall Precaution Booklet Issued: Yes (comment) Precaution Comments: pt able to report 3/3 back precautions.   Required Braces or Orthoses: Spinal Brace Spinal Brace: Lumbar corset;Applied in sitting position    Mobility                 Transfers Overall transfer level: Needs assistance Equipment used: Rolling walker (2 wheeled) Transfers: Sit to/from Stand Sit to Stand: Min assist         General transfer comment: Min assist to support trunk during transitions. Verbal cues to make sure she backs up and feels both legs on the chair before attempting to reach back to sit down.   Ambulation/Gait Ambulation/Gait assistance: Min assist Ambulation Distance (Feet): 20 Feet Assistive device: Rolling walker (2 wheeled) Gait Pattern/deviations: Shuffle;Trunk flexed Gait velocity: decreased   General Gait Details: Pt reports continued left leg pain and weakness with gait.  As she fatigues bil knees become flexed.  She has a hard time turning the RW as she is WB heavily through her arms at all times during gait.            Balance Overall balance assessment: Needs assistance         Standing balance support: Bilateral upper extremity supported Standing balance-Leahy Scale: Poor Standing balance comment: needs external assist for support in standing.                     Cognition Arousal/Alertness: Awake/alert Behavior During Therapy: WFL for tasks assessed/performed Overall Cognitive Status: Within Functional Limits for tasks assessed                             Pertinent Vitals/Pain Pain Assessment: 0-10 Pain Score: 5  Pain Location: back and left leg Pain Descriptors / Indicators: Aching;Burning Pain Intervention(s): Monitored during session;Limited activity within patient's tolerance;Repositioned           PT Goals (current goals can now be found in the care plan section) Acute Rehab PT Goals Patient Stated Goal: get back to what she was doing before Progress towards PT goals: Progressing toward goals    Frequency  Min 5X/week    PT Plan Current plan remains appropriate       End of Session Equipment Utilized During Treatment: Back brace Activity Tolerance: Patient limited by fatigue;Patient limited by pain Patient left: in chair;with call bell/phone within reach     Time: 4098-1191 PT Time Calculation (min): 12 min  Charges:  $Gait Training: 8-22 mins  Rollene Rotunda Colie Fugitt, PT, DPT [phone]   08/29/2014, 8:55 AM

## 2014-08-29 NOTE — Progress Notes (Signed)
Patient blood sugar at 54 this AM. She is alert and oriented with no symptoms of hypoglycemia noted. Breakfast arrived. Patient is eating breakfast at this time. Will recheck blood sugar. Will continue to monitor.  Sim Boast, RN

## 2014-08-29 NOTE — Progress Notes (Signed)
Subjective: Patient reports Overall she's doing better significant proven preoperative leg pain still has a fair amount back and leg soreness however but she's making steady progress every day  Objective: Vital signs in last 24 hours: Temp:  [97.5 F (36.4 C)-98.4 F (36.9 C)] 98 F (36.7 C) (09/05 0942) Pulse Rate:  [57-65] 63 (09/05 0942) Resp:  [16-18] 18 (09/05 0942) BP: (131-175)/(44-61) 163/61 mmHg (09/05 0942) SpO2:  [93 %-100 %] 97 % (09/05 0942)  Intake/Output from previous day: 09/04 0701 - 09/05 0700 In: 740 [P.O.:740] Out: -  Intake/Output this shift:    Strength out of 5 wound clean dry and intact  Lab Results: No results found for this basename: WBC, HGB, HCT, PLT,  in the last 72 hours BMET No results found for this basename: NA, K, CL, CO2, GLUCOSE, BUN, CREATININE, CALCIUM,  in the last 72 hours  Studies/Results: No results found.  Assessment/Plan: Patient will continue physical and occupational therapy has been accepted for rehabilitation potential he Monday Tuesday   LOS: 5 days     Fahim Kats P 08/29/2014, 10:13 AM

## 2014-08-30 LAB — GLUCOSE, CAPILLARY
Glucose-Capillary: 59 mg/dL — ABNORMAL LOW (ref 70–99)
Glucose-Capillary: 66 mg/dL — ABNORMAL LOW (ref 70–99)
Glucose-Capillary: 91 mg/dL (ref 70–99)
Glucose-Capillary: 84 mg/dL (ref 70–99)
Glucose-Capillary: 112 mg/dL — ABNORMAL HIGH (ref 70–99)
Glucose-Capillary: 86 mg/dL (ref 70–99)
Glucose-Capillary: 116 mg/dL — ABNORMAL HIGH (ref 70–99)

## 2014-08-30 MED ORDER — POLYETHYLENE GLYCOL 3350 17 G PO PACK
17.0000 g | PACK | Freq: Every day | ORAL | Status: DC | PRN
  Administered 2014-08-30 – 2014-08-31 (×2): 17 g via ORAL
  Filled 2014-08-30 (×2): qty 1

## 2014-08-30 MED ORDER — INSULIN GLARGINE 100 UNIT/ML ~~LOC~~ SOLN
10.0000 [IU] | Freq: Every day | SUBCUTANEOUS | Status: DC
  Administered 2014-08-30: 10 [IU] via SUBCUTANEOUS
  Filled 2014-08-30 (×2): qty 0.1

## 2014-08-30 MED ORDER — BISACODYL 10 MG RE SUPP: 10.0000 mg | Freq: Every day | RECTAL | Status: DC | PRN

## 2014-08-30 NOTE — Progress Notes (Signed)
Patient ID: April [name], female   DOB: 10/16/1925, 81 y.o.   MRN: [id number] Doing much better a little more back soreness this morning but still improving leg pain  Neurologically stable  Wound clean dry and intact  Awaiting rehabilitation

## 2014-08-30 NOTE — Progress Notes (Signed)
PT Cancellation Note  Patient Details Name: EVANGELA HEFFLER MRN: [id number] DOB: 04-06-1925   Cancelled Treatment:    Reason Eval/Treat Not Completed: Other (comment).  Pt reports her blood sugars have been too low to try to get up and walk today.  She feels too weak to try.  She is agreeable for PT to check back tomorrow for ambulating.   Thanks,    Rollene Rotunda. Carole Deere, PT, DPT [phone]   08/30/2014, 3:31 PM

## 2014-08-30 NOTE — Progress Notes (Signed)
Patient blood sugar 59 this am. Patient asymptomatic. Patient was given orange juice and is eating breakfast.  Blood sugar will be monitored closely. Monia Pouch, RN

## 2014-08-31 ENCOUNTER — Encounter (HOSPITAL_COMMUNITY): Payer: Self-pay | Admitting: *Deleted

## 2014-08-31 ENCOUNTER — Inpatient Hospital Stay (HOSPITAL_COMMUNITY)
Admission: RE | Admit: 2014-08-31 | Discharge: 2014-09-08 | DRG: 945 | Disposition: A | Discharge status: Home Health | Payer: Worker's Compensation | Source: Intra-hospital | Attending: Physical Medicine & Rehabilitation | Admitting: Physical Medicine & Rehabilitation

## 2014-08-31 DIAGNOSIS — IMO0002 Reserved for concepts with insufficient information to code with codable children: Secondary | ICD-10-CM

## 2014-08-31 DIAGNOSIS — M48061 Spinal stenosis, lumbar region without neurogenic claudication: Secondary | ICD-10-CM | POA: Diagnosis present

## 2014-08-31 DIAGNOSIS — E785 Hyperlipidemia, unspecified: Secondary | ICD-10-CM | POA: Diagnosis present

## 2014-08-31 DIAGNOSIS — E860 Dehydration: Secondary | ICD-10-CM | POA: Diagnosis present

## 2014-08-31 DIAGNOSIS — Z794 Long term (current) use of insulin: Secondary | ICD-10-CM

## 2014-08-31 DIAGNOSIS — Z96649 Presence of unspecified artificial hip joint: Secondary | ICD-10-CM

## 2014-08-31 DIAGNOSIS — Z981 Arthrodesis status: Secondary | ICD-10-CM

## 2014-08-31 DIAGNOSIS — Z5189 Encounter for other specified aftercare: Principal | ICD-10-CM

## 2014-08-31 DIAGNOSIS — E1149 Type 2 diabetes mellitus with other diabetic neurological complication: Secondary | ICD-10-CM

## 2014-08-31 DIAGNOSIS — I10 Essential (primary) hypertension: Secondary | ICD-10-CM | POA: Diagnosis present

## 2014-08-31 DIAGNOSIS — N390 Urinary tract infection, site not specified: Secondary | ICD-10-CM | POA: Diagnosis present

## 2014-08-31 DIAGNOSIS — E1142 Type 2 diabetes mellitus with diabetic polyneuropathy: Secondary | ICD-10-CM

## 2014-08-31 DIAGNOSIS — K5900 Constipation, unspecified: Secondary | ICD-10-CM | POA: Diagnosis present

## 2014-08-31 DIAGNOSIS — E119 Type 2 diabetes mellitus without complications: Secondary | ICD-10-CM | POA: Diagnosis present

## 2014-08-31 DIAGNOSIS — E039 Hypothyroidism, unspecified: Secondary | ICD-10-CM | POA: Diagnosis present

## 2014-08-31 DIAGNOSIS — Z79899 Other long term (current) drug therapy: Secondary | ICD-10-CM

## 2014-08-31 DIAGNOSIS — M5416 Radiculopathy, lumbar region: Secondary | ICD-10-CM | POA: Diagnosis present

## 2014-08-31 LAB — GLUCOSE, CAPILLARY
GLUCOSE-CAPILLARY: 196 mg/dL — AB (ref 70–99)
Glucose-Capillary: 78 mg/dL (ref 70–99)
Glucose-Capillary: 111 mg/dL — ABNORMAL HIGH (ref 70–99)
Glucose-Capillary: 186 mg/dL — ABNORMAL HIGH (ref 70–99)

## 2014-08-31 LAB — URINALYSIS, ROUTINE W REFLEX MICROSCOPIC
Bilirubin Urine: NEGATIVE
GLUCOSE, UA: NEGATIVE mg/dL
Ketones, ur: NEGATIVE mg/dL
Nitrite: NEGATIVE
PH: 7.5 (ref 5.0–8.0)
Protein, ur: NEGATIVE mg/dL
Specific Gravity, Urine: 1.01 (ref 1.005–1.030)
Urobilinogen, UA: 0.2 mg/dL (ref 0.0–1.0)

## 2014-08-31 LAB — URINE MICROSCOPIC-ADD ON

## 2014-08-31 MED ORDER — ACETAMINOPHEN 325 MG PO TABS
325.0000 mg | ORAL_TABLET | ORAL | Status: DC | PRN
  Administered 2014-09-02 (×3): 650 mg via ORAL
  Filled 2014-08-31 (×3): qty 2

## 2014-08-31 MED ORDER — PROCHLORPERAZINE 25 MG RE SUPP
12.5000 mg | Freq: Four times a day (QID) | RECTAL | Status: DC | PRN
  Filled 2014-08-31: qty 1

## 2014-08-31 MED ORDER — MENTHOL 3 MG MT LOZG
1.0000 | LOZENGE | OROMUCOSAL | Status: DC | PRN
Start: 2014-08-31 — End: 2014-09-08
  Administered 2014-09-02: 3 mg via ORAL
  Filled 2014-08-31 (×2): qty 9

## 2014-08-31 MED ORDER — PROCHLORPERAZINE MALEATE 5 MG PO TABS
5.0000 mg | ORAL_TABLET | Freq: Four times a day (QID) | ORAL | Status: DC | PRN
  Filled 2014-08-31: qty 2

## 2014-08-31 MED ORDER — ALUM & MAG HYDROXIDE-SIMETH 200-200-20 MG/5ML PO SUSP: 30.0000 mL | ORAL | Status: DC | PRN

## 2014-08-31 MED ORDER — FENOFIBRATE 54 MG PO TABS
54.0000 mg | ORAL_TABLET | Freq: Every day | ORAL | Status: DC
  Administered 2014-09-01 – 2014-09-08 (×8): 54 mg via ORAL
  Filled 2014-08-31 (×9): qty 1

## 2014-08-31 MED ORDER — ATENOLOL 100 MG PO TABS
100.0000 mg | ORAL_TABLET | Freq: Every day | ORAL | Status: DC
  Administered 2014-09-01 – 2014-09-08 (×8): 100 mg via ORAL
  Filled 2014-08-31 (×9): qty 1

## 2014-08-31 MED ORDER — VITAMIN D (ERGOCALCIFEROL) 1.25 MG (50000 UNIT) PO CAPS
50000.0000 [IU] | ORAL_CAPSULE | ORAL | Status: DC
  Administered 2014-09-07: 50000 [IU] via ORAL
  Filled 2014-08-31: qty 1

## 2014-08-31 MED ORDER — INSULIN GLARGINE 100 UNIT/ML ~~LOC~~ SOLN
10.0000 [IU] | Freq: Every day | SUBCUTANEOUS | Status: DC
  Administered 2014-08-31 – 2014-09-07 (×8): 10 [IU] via SUBCUTANEOUS
  Filled 2014-08-31 (×9): qty 0.1

## 2014-08-31 MED ORDER — SENNA 8.6 MG PO TABS
1.0000 | ORAL_TABLET | Freq: Two times a day (BID) | ORAL | Status: DC
  Administered 2014-08-31 – 2014-09-08 (×15): 8.6 mg via ORAL
  Filled 2014-08-31 (×22): qty 1

## 2014-08-31 MED ORDER — ESTROGENS, CONJUGATED 0.625 MG/GM VA CREA
1.0000 | TOPICAL_CREAM | VAGINAL | Status: DC
  Administered 2014-09-01: 1 via VAGINAL
  Filled 2014-08-31: qty 42.5

## 2014-08-31 MED ORDER — CIPROFLOXACIN HCL 500 MG PO TABS
500.0000 mg | ORAL_TABLET | Freq: Two times a day (BID) | ORAL | Status: DC
  Administered 2014-08-31: 500 mg via ORAL
  Filled 2014-08-31: qty 1

## 2014-08-31 MED ORDER — LISINOPRIL 40 MG PO TABS
40.0000 mg | ORAL_TABLET | Freq: Every day | ORAL | Status: DC
  Administered 2014-09-01 – 2014-09-08 (×7): 40 mg via ORAL
  Filled 2014-08-31 (×9): qty 1

## 2014-08-31 MED ORDER — PHENOL 1.4 % MT LIQD
1.0000 | OROMUCOSAL | Status: DC | PRN
  Filled 2014-08-31: qty 177

## 2014-08-31 MED ORDER — ASPIRIN EC 81 MG PO TBEC
81.0000 mg | DELAYED_RELEASE_TABLET | Freq: Every day | ORAL | Status: DC
  Administered 2014-09-01 – 2014-09-08 (×8): 81 mg via ORAL
  Filled 2014-08-31 (×9): qty 1

## 2014-08-31 MED ORDER — PROCHLORPERAZINE EDISYLATE 5 MG/ML IJ SOLN
5.0000 mg | Freq: Four times a day (QID) | INTRAMUSCULAR | Status: DC | PRN
  Filled 2014-08-31: qty 2

## 2014-08-31 MED ORDER — FLEET ENEMA 7-19 GM/118ML RE ENEM: 1.0000 | ENEMA | Freq: Once | RECTAL | Status: AC | PRN

## 2014-08-31 MED ORDER — AMLODIPINE BESYLATE 10 MG PO TABS
10.0000 mg | ORAL_TABLET | Freq: Every day | ORAL | Status: DC
  Administered 2014-09-01 – 2014-09-08 (×7): 10 mg via ORAL
  Filled 2014-08-31 (×9): qty 1

## 2014-08-31 MED ORDER — TRAMADOL HCL 50 MG PO TABS
50.0000 mg | ORAL_TABLET | Freq: Four times a day (QID) | ORAL | Status: DC | PRN
  Administered 2014-09-01 – 2014-09-03 (×4): 50 mg via ORAL
  Filled 2014-08-31 (×4): qty 1

## 2014-08-31 MED ORDER — DOCUSATE SODIUM 100 MG PO CAPS
200.0000 mg | ORAL_CAPSULE | Freq: Every day | ORAL | Status: DC
  Administered 2014-09-01 – 2014-09-03 (×3): 200 mg via ORAL
  Filled 2014-08-31 (×4): qty 2

## 2014-08-31 MED ORDER — TRAZODONE HCL 50 MG PO TABS: 25.0000 mg | ORAL_TABLET | Freq: Every evening | ORAL | Status: DC | PRN

## 2014-08-31 MED ORDER — LEVOTHYROXINE SODIUM 125 MCG PO TABS
125.0000 ug | ORAL_TABLET | Freq: Every day | ORAL | Status: DC
  Administered 2014-09-01 – 2014-09-08 (×8): 125 ug via ORAL
  Filled 2014-08-31 (×9): qty 1

## 2014-08-31 MED ORDER — CEPHALEXIN 250 MG PO CAPS
250.0000 mg | ORAL_CAPSULE | Freq: Three times a day (TID) | ORAL | Status: DC
  Administered 2014-08-31 – 2014-09-03 (×9): 250 mg via ORAL
  Filled 2014-08-31 (×12): qty 1

## 2014-08-31 MED ORDER — SIMVASTATIN 5 MG PO TABS
5.0000 mg | ORAL_TABLET | Freq: Every day | ORAL | Status: DC
  Administered 2014-08-31 – 2014-09-07 (×8): 5 mg via ORAL
  Filled 2014-08-31 (×9): qty 1

## 2014-08-31 MED ORDER — GUAIFENESIN-DM 100-10 MG/5ML PO SYRP: 5.0000 mL | ORAL_SOLUTION | Freq: Four times a day (QID) | ORAL | Status: DC | PRN

## 2014-08-31 MED ORDER — BISACODYL 10 MG RE SUPP
10.0000 mg | Freq: Every day | RECTAL | Status: DC | PRN
  Administered 2014-09-01: 10 mg via RECTAL
  Filled 2014-08-31: qty 1

## 2014-08-31 MED ORDER — OXYCODONE HCL 5 MG PO TABS
10.0000 mg | ORAL_TABLET | ORAL | Status: DC | PRN
  Administered 2014-08-31 – 2014-09-08 (×37): 10 mg via ORAL
  Filled 2014-08-31 (×37): qty 2

## 2014-08-31 MED ORDER — ALUM & MAG HYDROXIDE-SIMETH 200-200-20 MG/5ML PO SUSP: 30.0000 mL | Freq: Four times a day (QID) | ORAL | Status: DC | PRN

## 2014-08-31 MED ORDER — PANTOPRAZOLE SODIUM 40 MG PO TBEC
40.0000 mg | DELAYED_RELEASE_TABLET | Freq: Two times a day (BID) | ORAL | Status: DC
  Administered 2014-08-31 – 2014-09-08 (×16): 40 mg via ORAL
  Filled 2014-08-31 (×15): qty 1

## 2014-08-31 MED ORDER — METHOCARBAMOL 500 MG PO TABS
500.0000 mg | ORAL_TABLET | Freq: Four times a day (QID) | ORAL | Status: DC | PRN
  Administered 2014-08-31 – 2014-09-06 (×6): 500 mg via ORAL
  Filled 2014-08-31 (×6): qty 1

## 2014-08-31 NOTE — Progress Notes (Signed)
PT Cancellation Note  Patient Details Name: April [name] MRN: [id number] DOB: 1925-10-06   Cancelled Treatment:    Reason Eval/Treat Not Completed: Other (comment) (Pt transferred to rehab)  Patient transferred to inpatient rehab.   [address] [place], [place] [phone] Berton Mount 08/31/2014, 2:45 PM

## 2014-08-31 NOTE — Progress Notes (Signed)
Report called to [facility] on 4W. Pt transferred to 4W23. Assessment stable.

## 2014-08-31 NOTE — H&P (Signed)
Physical Medicine and Rehabilitation Admission H&P  CC: L3/4 spinal stenosis with BLE radiculopathy  HPI: April [name] is a 80 y.o. female with h/o HTN, DM type 2, fall at work 03/2104 with hip fracture--CIR stay with progress to supervision level. She has had progressive worsening back and bilateral leg pain rating down to the front of her shin adjacent of her foot consistent with predominately and L4 nerve root pattern. Workup revealed severe spinal stenosis with virtually complete CSF block at L3-4 with a virtually autofused L5-S1 and patient elected to undergo decompressive lam L3/4 with fusion and repair of inadvertent dural tear by Dr. Wynetta Emery on 08/24/14. Post op on bedrest till 08/26/14. She has problems with urinary retention. LE weakness as well as pain affecting mobiliy. She continues to require IV dilaudid for pain management. She has had hypoglycemic episodes and lantus decrease to 10 units. UA done due to frequency and shows evidence of pyuria.    ROS: Denies sob, cp, cough. Has had some constipation. Denies urinary issues. Back pain and bilateral leg pain also noted. No insomnia.  A  review of systems has been performed and if not noted above is otherwise negative.    Past Medical History    Diagnosis  Date    .  Hyperlipidemia  04/07/2014      takes Lovastatin daily    .  OA (osteoarthritis)  04/07/2014    .  Hypertension       takes Amlodipine and Atenolol daily    .  HTN (hypertension)  04/07/2014    .  Diabetes mellitus without complication       takes Lantus daily    .  Hypothyroidism  04/07/2014      takes Synthroid daily    .  History of bronchitis  20 yrs ago    .  History of lupus  27yrs ago    .  Joint pain     .  Peripheral edema     .  UTI (lower urinary tract infection)       completed antibioitcs on 07/28/14    .  Urinary frequency       takes Vesicare daily    .  Chronic back pain       stenosis    .  Constipation       takes an OTC stool softener    .   GERD (gastroesophageal reflux disease)       not on Protonix daily    .  Nocturia     .  Cataract       left eye    .  History of shingles        Past Surgical History    Procedure  Laterality  Date    .  Cholecystectomy      .  Hip arthroplasty  Left  04/08/2014      Procedure: LEFT HIP HEMIARTHROPLASTY; Surgeon: Verlee Rossetti, MD; Location: [facility] OR; Service: Orthopedics; Laterality: Left;    .  Tonsillectomy      .  Adenoidectomy      .  Left cataract removed      .  Eye surgery       History reviewed. No pertinent family history.  Social History: Daughter lives with patient. Patient was working prior to fall 03/2014 but has been sedentary due to left hip pain since fall. She reports that she has never smoked. She has never used smokeless tobacco. She reports that  she does not drink alcohol or use illicit drugs.   Allergies: No Known Allergies    Medications Prior to Admission    Medication  Sig  Dispense  Refill    .  acidophilus (RISAQUAD) CAPS capsule  Take 1 capsule by mouth daily.      Marland Kitchen  alendronate (FOSAMAX) 70 MG tablet  Take 70 mg by mouth once a week. Take with a full glass of water on an empty stomach.      Marland Kitchen  amLODipine (NORVASC) 10 MG tablet  Take 1 tablet (10 mg total) by mouth daily.  30 tablet  1    .  atenolol (TENORMIN) 100 MG tablet  Take 1 tablet (100 mg total) by mouth daily.  30 tablet  1    .  conjugated estrogens (PREMARIN) vaginal cream  Place 1 Applicatorful vaginally every other day. Uses Monday, Wednesday, and Friday      .  CRANBERRY PO  Take 1 tablet by mouth daily.      Marland Kitchen  docusate sodium (COLACE) 100 MG capsule  Take 200 mg by mouth daily.      Marland Kitchen  gemfibrozil (LOPID) 600 MG tablet  Take 1 tablet (600 mg total) by mouth 2 (two) times daily before a meal.  60 tablet  1    .  HYDROcodone-acetaminophen (NORCO/VICODIN) 5-325 MG per tablet  Take 1-2 tablets by mouth every 4 (four) hours as needed for moderate pain.      Marland Kitchen  insulin glargine (LANTUS) 100  UNIT/ML injection  Inject 10 Units into the skin at bedtime.      Marland Kitchen  levothyroxine (SYNTHROID, LEVOTHROID) 125 MCG tablet  Take 1 tablet (125 mcg total) by mouth daily before breakfast.  30 tablet  1    .  lisinopril (PRINIVIL,ZESTRIL) 40 MG tablet  Take 1 tablet (40 mg total) by mouth daily.  30 tablet  1    .  lovastatin (MEVACOR) 40 MG tablet  Take 40 mg by mouth daily.      .  mupirocin ointment (BACTROBAN) 2 %  Place 1 application into the nose 2 (two) times daily. For 5 days      .  Vitamin D, Ergocalciferol, (DRISDOL) 50000 UNITS CAPS capsule  Take 50,000 Units by mouth as needed.       Home:  Home Living  Family/patient expects to be discharged to:: Skilled nursing facility  Living Arrangements: Children  Available Help at Discharge: Family;Available 24 hours/day  Type of Home: House  Home Access: Stairs to enter  [employer] of Steps: 4  Entrance Stairs-Rails: Right;Left  Home Layout: One level  Home Equipment: Tub bench;Adaptive equipment  Adaptive Equipment: Reacher;Sock aid;Long-handled shoe horn;Long-handled sponge  Functional History:  Prior Function  Level of Independence: Independent  Functional Status:  Mobility:  Bed Mobility  Overal bed mobility: (pt up in chair upon PT arrival)  Bed Mobility: Sit to Sidelying  Rolling: Supervision  Sidelying to sit: Max assist  Sit to sidelying: Min assist  General bed mobility comments: Min assist to lift legs into the bed. Pt remembered to go to her side, but needed cues for hand placement on the bed flat with no rails to avoid twisting.  Transfers  Overall transfer level: Needs assistance  Equipment used: Rolling walker (2 wheeled)  Transfers: Sit to/from Stand  Sit to Stand: Min assist  Stand pivot transfers: +2 physical assistance;Max assist  General transfer comment: Min assist to support trunk during transitions. Verbal cues  to make sure she backs up and feels both legs on the chair before attempting to reach  back to sit down.  Ambulation/Gait  Ambulation/Gait assistance: Min assist  Ambulation Distance (Feet): 20 Feet  Assistive device: Rolling walker (2 wheeled)  Gait Pattern/deviations: Shuffle;Trunk flexed  Gait velocity: decreased  Gait velocity interpretation: <1.8 ft/sec, indicative of risk for recurrent falls  General Gait Details: Pt reports continued left leg pain and weakness with gait. As she fatigues bil knees become flexed. She has a hard time turning the RW as she is WB heavily through her arms at all times during gait.   ADL:  ADL  Overall ADL's : Needs assistance/impaired  Upper Body Dressing : Moderate assistance;Sitting  Lower Body Dressing: Maximal assistance;Sit to/from stand;With adaptive equipment  Toilet Transfer: Maximal assistance;+2 for physical assistance;Stand-pivot;RW (from bed to chair)  Functional mobility during ADLs: +2 for physical assistance;Rolling walker;Maximal assistance (stand pivot)  General ADL Comments: Practiced with AE for LB ADLs. Educated on back brace wear. Pt donned panties with reacher.  Cognition:  Cognition  Overall Cognitive Status: Within Functional Limits for tasks assessed  Orientation Level: Oriented X4  Cognition  Arousal/Alertness: Awake/alert  Behavior During Therapy: WFL for tasks assessed/performed  Overall Cognitive Status: Within Functional Limits for tasks assessed    Physical Exam:  Blood pressure 166/56, pulse 65, temperature 99 F (37.2 C), temperature source Oral, resp. rate 18, height  (1.651 m), weight 86.4 kg (190 lb 7.6 oz), SpO2 91.00%.  Physical Exam  Nursing note and vitals reviewed.  Constitutional: She is oriented to person, place, and time. She appears well-developed and well-nourished.  HENT: oral mucosa pink and moist Head: Normocephalic and atraumatic.  Eyes: Conjunctivae are normal. Pupils are equal, round, and reactive to light.  Neck: Normal range of motion. Neck supple.  Cardiovascular: Normal  rate and regular rhythm. No murmurs or rubs. Respiratory: Effort normal and breath sounds normal. No respiratory distress. She has no wheezes. No rales GI: Soft. Bowel sounds are normal. She exhibits no distension. There is no tenderness.  Musculoskeletal: She exhibits no edema.  Back pain with SLR BLE. Left hip pain with ROM at hip.  Neurological: She is alert and oriented to person, place, and time. No cranial nerve deficit. Coordination normal.  Good sitting balance. Mild weakness (4-) in left quads, hip adductors. HF are 4- as well (pain). ADF/APF 5/5. UE's grossly 5/5 prox to distal. Pain in L3 distribution, left > right legs. No focal sensory loss is appreciated however except for mild stocking glove sensory loss in the feet (to ankles). Cognitively displays normal insight, awareness, memory. Skin: Skin is warm and dry.  Psychiatric: She has a normal mood and affect. Her behavior is normal. Thought content normal      Results for orders placed during the hospital encounter of 08/24/14 (from the past 48 hour(s))    GLUCOSE, CAPILLARY Status: Abnormal     Collection Time     08/29/14 10:36 AM    Result  Value  Ref Range     Glucose-Capillary  129 (*)  70 - 99 mg/dL    GLUCOSE, CAPILLARY Status: Abnormal     Collection Time     08/29/14 11:39 AM    Result  Value  Ref Range     Glucose-Capillary  108 (*)  70 - 99 mg/dL    GLUCOSE, CAPILLARY Status: None     Collection Time     08/29/14 4:41 PM    Result  Value  Ref Range     Glucose-Capillary  72  70 - 99 mg/dL    GLUCOSE, CAPILLARY Status: Abnormal     Collection Time     08/29/14 9:14 PM    Result  Value  Ref Range     Glucose-Capillary  127 (*)  70 - 99 mg/dL     Comment 1  Documented in Chart      Comment 2  Notify RN     GLUCOSE, CAPILLARY Status: Abnormal     Collection Time     08/30/14 6:59 AM    Result  Value  Ref Range     Glucose-Capillary  59 (*)  70 - 99 mg/dL     Comment 1  Documented in Chart      Comment 2   Notify RN     GLUCOSE, CAPILLARY Status: Abnormal     Collection Time     08/30/14 7:33 AM    Result  Value  Ref Range     Glucose-Capillary  66 (*)  70 - 99 mg/dL     Comment 1  Notify RN     GLUCOSE, CAPILLARY Status: None     Collection Time     08/30/14 7:44 AM    Result  Value  Ref Range     Glucose-Capillary  91  70 - 99 mg/dL    GLUCOSE, CAPILLARY Status: None     Collection Time     08/30/14 11:52 AM    Result  Value  Ref Range     Glucose-Capillary  84  70 - 99 mg/dL    GLUCOSE, CAPILLARY Status: Abnormal     Collection Time     08/30/14 2:30 PM    Result  Value  Ref Range     Glucose-Capillary  112 (*)  70 - 99 mg/dL    GLUCOSE, CAPILLARY Status: None     Collection Time     08/30/14 4:26 PM    Result  Value  Ref Range     Glucose-Capillary  86  70 - 99 mg/dL    GLUCOSE, CAPILLARY Status: Abnormal     Collection Time     08/30/14 9:45 PM    Result  Value  Ref Range     Glucose-Capillary  116 (*)  70 - 99 mg/dL     Comment 1  Documented in Chart      Comment 2  Notify RN     URINALYSIS, ROUTINE W REFLEX MICROSCOPIC Status: Abnormal     Collection Time     08/31/14 1:27 AM    Result  Value  Ref Range     Color, Urine  YELLOW  YELLOW     APPearance  CLOUDY (*)  CLEAR     Specific Gravity, Urine  1.010  1.005 - 1.030     pH  7.5  5.0 - 8.0     Glucose, UA  NEGATIVE  NEGATIVE mg/dL     Hgb urine dipstick  TRACE (*)  NEGATIVE     Bilirubin Urine  NEGATIVE  NEGATIVE     Ketones, ur  NEGATIVE  NEGATIVE mg/dL     Protein, ur  NEGATIVE  NEGATIVE mg/dL     Urobilinogen, UA  0.2  0.0 - 1.0 mg/dL     Nitrite  NEGATIVE  NEGATIVE     Leukocytes, UA  LARGE (*)  NEGATIVE    URINE MICROSCOPIC-ADD ON Status: Abnormal     Collection  Time     08/31/14 1:27 AM    Result  Value  Ref Range     Squamous Epithelial / LPF  FEW (*)  RARE     WBC, UA  TOO NUMEROUS TO COUNT  <3 WBC/hpf     RBC / HPF  0-2  <3 RBC/hpf     Bacteria, UA  MANY (*)  RARE    GLUCOSE, CAPILLARY Status:  None     Collection Time     08/31/14 6:43 AM    Result  Value  Ref Range     Glucose-Capillary  78  70 - 99 mg/dL     Comment 1  Documented in Chart      Comment 2  Notify RN      No results found.  Medical Problem List and Plan:  1. Functional deficits secondary to Left Lumbar 3 radiculopathy/stenosis s/p L3-4 laminectomy and fusion  2. DVT Prophylaxis/Anticoagulation: Mechanical: Sequential compression devices, below knee Bilateral lower extremities  3. Pain Management: D/C dilaudid and use oxycodone prn for pain management. Will likely need long acting pain medication for consistent relief. Will monitor for now and add low dose neurontin for radiculopathy.  4. Mood: Team to provide ego support. LCSW to follow for evaluation and support.  5. Neuropsych: This patient is capable of making decisions on her own behalf.  6. Skin/Wound Care: Will add nutritional supplement to help with wound healing as well as BS stabilization. Routine pressure relief measures.  7. DM type 2: Will monitor BS with ac/hs checks. Continue lantus 10 units and and use SSI for elevated BS.  8. UTI: Add kelfex empirically. UCS pending.  9. HTN: Monitor BP every 8 hours. Continue norvasc, Lisiopril and tenormin  10. Constipation: augment bowel regimen  Post Admission Physician Evaluation:  1. Functional deficits secondary to Left lumbar 3 radiculopathy/stenosis s/p L3/4 laminectomy with fusion.  2. Patient is admitted to receive collaborative, interdisciplinary care between the physiatrist, rehab nursing staff, and therapy team. 3. Patient's level of medical complexity and substantial therapy needs in context of that medical necessity cannot be provided at a lesser intensity of care such as a SNF. 4. Patient has experienced substantial functional loss from his/her baseline which was documented above under the "Functional History" and "Functional Status" headings. Judging by the patient's diagnosis, physical exam, and  functional history, the patient has potential for functional progress which will result in measurable gains while on inpatient rehab. These gains will be of substantial and practical use upon discharge in facilitating mobility and self-care at the household level. 5. Physiatrist will provide 24 hour management of medical needs as well as oversight of the therapy plan/treatment and provide guidance as appropriate regarding the interaction of the two. 6. 24 hour rehab nursing will assist with bladder management, bowel management, safety, skin/wound care, disease management, medication administration, pain management and patient education and help integrate therapy concepts, techniques,education, etc. 7. PT will assess and treat for/with: Lower extremity strength, range of motion, stamina, balance, functional mobility, safety, adaptive techniques and equipment, pain mgt, back precautions, don/doff brace, caregiver education. Goals are: supervision to mod I. 8. OT will assess and treat for/with: ADL's, functional mobility, safety, upper extremity strength, adaptive techniques and equipment, pain mgt, back precautions, don/doff brace, leisure awareness, caregiver ed. Goals are: supervision to mod I except brace don/doff---min assist. Therapy may not yet proceed with showering this patient. 9. SLP will assess and treat for/with: n/a. Goals are: n/a. 10. Case Management  and Child psychotherapist will assess and treat for psychological issues and discharge planning. 11. Team conference will be held weekly to assess progress toward goals and to determine barriers to discharge. 12. Patient will receive at least 3 hours of therapy per day at least 5 days per week. 13. ELOS: 7-9 days  14. Prognosis: excellent  Ranelle Oyster, MD, [facility] Physical Medicine & Rehabilitation 08/31/2014   08/31/2014

## 2014-08-31 NOTE — PMR Pre-admission (Signed)
PMR Admission Coordinator Pre-Admission Assessment  Patient: April [name] is an 80 y.o., female MRN: [id number] DOB: [birth year] Height:  (165.1 cm) Weight: 86.4 kg (190 lb 7.6 oz)              Insurance Information HMO:      PPO:       PCP:       IPA:       80/20:       OTHER:   PRIMARY: Workers comp      Policy#: [id number]      Subscriber: Cleda Daub CM Name: Dorette Grate      Phone#: [phone]     Fax#:   Pre-Cert#:                                  Employer:  [employer] Benefits:  Phone #: [phone]     Name:   Eff. Date: 04/08/14 accident date     Deduct:        Out of Pocket Max:        Life Max:   CIR:        SNF:   Outpatient:       Co-Pay:   Home Health:        Co-Pay:   DME:       Co-Pay:   Providers:   Insurance adjuster is Marlowe Sax - [phone]   Emergency Contact Information Contact Information   Name Relation Home Work Mobile   Smith,Sharon Daughter [phone]     Maleeah, Crossman [phone]     Epling,Judy Daughter [phone]  [phone]     Current Medical History  Patient Admitting Diagnosis: Left Lumbar 3 radiculopathy/stenosis s/p L3-4 laminectomy and fusion   History of Present Illness: An 80 y.o. female with h/o HTN, DM type 2, fall at work 04/07/14 with hip fracture--CIR stay with progress to supervision level. She has had progressive worsening back and bilateral leg pain rating down to the front of her shin adjacent of her foot consistent with predominately and L4 nerve root pattern. Workup revealed severe spinal stenosis with virtually complete CSF block at L3-4 with a virtually autofused L5-S1 and patient elected to undergo decompressive lam L3/4 with fusion and repair of inadvertent dural tear by Dr. Wynetta Emery on 08/24/14. Post op on bedrest till 08/26/14. She has had problems with urinary retention. LE weakness as well as pain affecting mobiliy.  She has had hypoglycemic episodes and lantus decrease to 10  units. UA done due to frequency and shows evidence of bacteria.  PT/OT evaluations completed with recommendations for inpatient rehab.     Past Medical History  Past Medical History  Diagnosis Date  . Hyperlipidemia 04/07/2014    takes Lovastatin daily  . OA (osteoarthritis) 04/07/2014  . Hypertension     takes Amlodipine and Atenolol daily  . HTN (hypertension) 04/07/2014  . Diabetes mellitus without complication     takes Lantus daily  . Hypothyroidism 04/07/2014    takes Synthroid daily  . History of bronchitis 20 yrs ago  . History of lupus 34yrs ago  . Joint pain   . Peripheral edema   . UTI (lower urinary tract infection)     completed antibioitcs on 07/28/14  . Urinary frequency     takes Vesicare daily  . Chronic back pain     stenosis  . Constipation     takes an OTC  stool softener   . GERD (gastroesophageal reflux disease)     not on Protonix daily  . Nocturia   . Cataract     left eye  . History of shingles     Family History  family history is not on file.  Prior Rehab/Hospitalizations:  Was on CIR 03/2014 after hip fracture.   Current Medications  Current facility-administered medications:0.45 % NaCl with KCl 20 mEq / L infusion, , Intravenous, Continuous, Mariam Dollar, MD, Last Rate: 10 mL/hr at 08/27/14 0307, 1,000 mL at 08/27/14 0307;  0.9 %  sodium chloride infusion, 250 mL, Intravenous, Continuous, Mariam Dollar, MD;  acetaminophen (TYLENOL) suppository 650 mg, 650 mg, Rectal, Q4H PRN, Mariam Dollar, MD;  acetaminophen (TYLENOL) tablet 650 mg, 650 mg, Oral, Q4H PRN, Mariam Dollar, MD alum & mag hydroxide-simeth (MAALOX/MYLANTA) 200-200-20 MG/5ML suspension 30 mL, 30 mL, Oral, Q6H PRN, Mariam Dollar, MD;  amLODipine (NORVASC) tablet 10 mg, 10 mg, Oral, Daily, Mariam Dollar, MD, 10 mg at 08/31/14 0949;  aspirin EC tablet 81 mg, 81 mg, Oral, Daily, Mariam Dollar, MD, 81 mg at 08/31/14 0949;  atenolol (TENORMIN) tablet 100 mg, 100 mg, Oral, Daily, Mariam Dollar, MD, 100 mg at  08/31/14 1610 bisacodyl (DULCOLAX) suppository 10 mg, 10 mg, Rectal, Daily PRN, Mariam Dollar, MD;  ciprofloxacin (CIPRO) tablet 500 mg, 500 mg, Oral, BID, Maeola Harman, MD;  conjugated estrogens (PREMARIN) vaginal cream 1 Applicatorful, 1 Applicatorful, Vaginal, QODAY, Mariam Dollar, MD, 1 Applicatorful at 08/30/14 0955;  docusate sodium (COLACE) capsule 200 mg, 200 mg, Oral, Daily, Mariam Dollar, MD, 200 mg at 08/31/14 0951 gemfibrozil (LOPID) tablet 600 mg, 600 mg, Oral, BID AC, Mariam Dollar, MD, 600 mg at 08/31/14 0948;  HYDROcodone-acetaminophen (NORCO/VICODIN) 5-325 MG per tablet 1-2 tablet, 1-2 tablet, Oral, Q4H PRN, Mariam Dollar, MD, 2 tablet at 08/29/14 0401;  HYDROmorphone (DILAUDID) injection 0.5-1 mg, 0.5-1 mg, Intravenous, Q2H PRN, Mariam Dollar, MD, 1 mg at 08/27/14 0306 HYDROmorphone (DILAUDID) tablet 2 mg, 2 mg, Oral, Q3H PRN, Mariam Dollar, MD, 2 mg at 08/31/14 1131;  insulin glargine (LANTUS) injection 10 Units, 10 Units, Subcutaneous, QHS, Mariam Dollar, MD, 10 Units at 08/30/14 2215;  levothyroxine (SYNTHROID, LEVOTHROID) tablet 125 mcg, 125 mcg, Oral, QAC breakfast, Mariam Dollar, MD, 125 mcg at 08/31/14 0743;  lisinopril (PRINIVIL,ZESTRIL) tablet 40 mg, 40 mg, Oral, Daily, Mariam Dollar, MD, 40 mg at 08/31/14 9604 menthol-cetylpyridinium (CEPACOL) lozenge 3 mg, 1 lozenge, Oral, PRN, Mariam Dollar, MD;  ondansetron [facility]) injection 4 mg, 4 mg, Intravenous, Q4H PRN, Mariam Dollar, MD;  oxyCODONE-acetaminophen (PERCOCET/ROXICET) 5-325 MG per tablet 1-2 tablet, 1-2 tablet, Oral, Q4H PRN, Mariam Dollar, MD, 2 tablet at 08/29/14 1512;  pantoprazole (PROTONIX) EC tablet 40 mg, 40 mg, Oral, BID, Mariam Dollar, MD, 40 mg at 08/31/14 0949 phenol (CHLORASEPTIC) mouth spray 1 spray, 1 spray, Mouth/Throat, PRN, Mariam Dollar, MD;  polyethylene glycol (MIRALAX / GLYCOLAX) packet 17 g, 17 g, Oral, Daily PRN, Mariam Dollar, MD, 17 g at 08/31/14 5409;  senna (SENOKOT) tablet 8.6 mg, 1 tablet, Oral, BID, Mariam Dollar, MD, 8.6 mg at  08/31/14 0949;  simvastatin (ZOCOR) tablet 5 mg, 5 mg, Oral, q1800, Mariam Dollar, MD, 5 mg at 08/30/14 1731 sodium chloride 0.9 % injection 10-40 mL, 10-40 mL, Intracatheter, Q12H, Mariam Dollar, MD;  sodium chloride 0.9 % injection 10-40 mL, 10-40 mL, Intracatheter, PRN,  Mariam Dollar, MD, 10 mL at 08/30/14 0943;  sodium chloride 0.9 % injection 3 mL, 3 mL, Intravenous, Q12H, Mariam Dollar, MD, 3 mL at 08/25/14 1044;  sodium chloride 0.9 % injection 3 mL, 3 mL, Intravenous, PRN, Mariam Dollar, MD [START ON 09/07/2014] Vitamin D (Ergocalciferol) (DRISDOL) capsule 50,000 Units, 50,000 Units, Oral, Q7 days, Mariam Dollar, MD  Patients Current Diet: Carb Control  Precautions / Restrictions Precautions Precautions: Back;Fall Precaution Booklet Issued: Yes (comment) Precaution Comments: pt able to report 3/3 back precautions.   Spinal Brace: Lumbar corset;Applied in sitting position Restrictions Weight Bearing Restrictions: No   Prior Activity Level Community (5-7x/wk): Went out daily.  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  Home Assistive Devices / Equipment Home Assistive Devices/Equipment: [employer];Wheelchair;Walker (specify type) Home Equipment: Tub bench;Adaptive equipment  Prior Functional Level Prior Function Level of Independence: Independent  Current Functional Level Cognition  Overall Cognitive Status: Within Functional Limits for tasks assessed Orientation Level: Oriented X4    Extremity Assessment (includes Sensation/Coordination)  Upper Extremity Assessment: Defer to OT evaluation  Lower Extremity Assessment: RLE deficits/detail;LLE deficits/detail  RLE Deficits / Details: bil leg weakness in standing (flexed and shaking knee posture).  LLE Deficits / Details: bil leg weakness in standing (flexed and shaking knee posture).  Cervical / Trunk Assessment: Normal     ADLs  Overall ADL's : Needs assistance/impaired Eating/Feeding: Modified  independent;Sitting Grooming: Moderate assistance;Standing Grooming Details (indicate cue type and reason): cues for upright posture Lower Body Bathing: Maximal assistance;Sit to/from stand Upper Body Dressing : Moderate assistance;Sitting Lower Body Dressing: Maximal assistance;Sit to/from stand;With adaptive equipment Toilet Transfer: Moderate assistance;Ambulation;RW;BSC Toileting- Clothing Manipulation and Hygiene: Minimal assistance;Sit to/from stand Toileting - Clothing Manipulation Details (indicate cue type and reason): min (A) for peri care Functional mobility during ADLs: Minimal assistance;Rolling walker General ADL Comments: Pt completed chair transfer with good hand placement. Pt required constant cues throughout session for posture. Pt with fatigue with more flexed posture. Pt completed toilet transfer, hand hygiene and oral care. Pt very brisk with sink level task due to incr pain and decr posture    Mobility  Overal bed mobility:  (pt up in chair upon PT arrival) Bed Mobility: Sit to Sidelying Rolling: Supervision Sidelying to sit: Max assist Sit to sidelying: Min assist General bed mobility comments: in chair on arrival    Transfers  Overall transfer level: Needs assistance Equipment used: Rolling walker (2 wheeled) Transfers: Sit to/from Stand Sit to Stand: Min assist Stand pivot transfers: +2 physical assistance;Max assist General transfer comment: cues for posture and reaching back to chair with descending to chair    Ambulation / Gait / Stairs / Wheelchair Mobility  Ambulation/Gait Ambulation/Gait assistance: Architect (Feet): 20 Feet Assistive device: Rolling walker (2 wheeled) Gait Pattern/deviations: Shuffle;Trunk flexed Gait velocity: decreased Gait velocity interpretation: <1.8 ft/sec, indicative of risk for recurrent falls General Gait Details: Pt reports continued left leg pain and weakness with gait.  As she fatigues bil knees  become flexed.  She has a hard time turning the RW as she is WB heavily through her arms at all times during gait.     Posture / Balance Overall balance assessment: Needs assistance  Sitting-balance support: Feet supported;No upper extremity supported  Sitting balance-Leahy Scale: Good  Standing balance support: No upper extremity supported  Standing balance-Leahy Scale: Poor  Standing balance comment: Needs mod assist for dynamic movements and cannot stand statically without support.  Special needs/care consideration BiPAP/CPAP No CPM No Continuous Drip IV No Dialysis No         Life Vest No Oxygen No Special Bed No Trach Size No Wound Vac (area) No      Skin Has sore area on buttocks.  Has dry skin.  Has a lumbar corset.                             Bowel mgmt: No BM since last Sunday, 08/24/14 Bladder mgmt: Voiding in bathroom with assistacne Diabetic mgmt Yes, on insulin at home.    Previous Home Environment Living Arrangements: Children Available Help at Discharge: Family;Available 24 hours/day Type of Home: House Home Layout: One level Home Access: Stairs to enter Entrance Stairs-Rails: Doctor, general practice of Steps: 4 Bathroom Shower/Tub: Tub/shower unit Home Care Services: Yes Type of Home Care Services: Homehealth aide  Discharge Living Setting Plans for Discharge Living Setting: House;Lives with (comment) (Lives with daughter, Jasmine December.) Type of Home at Discharge: House Discharge Home Layout: One level Discharge Home Access: Stairs to enter Entrance Stairs-Number of Steps: 4 steps at front entrance Does the patient have any problems obtaining your medications?: No  Social/Family/Support Systems Patient Roles: Parent (Has several daughters and a son.) Contact Information: Sterling Big - daughter Anticipated Caregiver: Daughter Anticipated Caregiver's Contact Information: Jasmine December - dtr [phone] Ability/Limitations of Caregiver: Dtr does not  work and can assist.  Lives with patient. Caregiver Availability: 24/7 Discharge Plan Discussed with Primary Caregiver: Yes Is Caregiver In Agreement with Plan?: Yes Does Caregiver/Family have Issues with Lodging/Transportation while Pt is in Rehab?: No  Goals/Additional Needs Patient/Family Goal for Rehab: PT/OT mod I goals Expected length of stay: 7 days Cultural Considerations: Christian Dietary Needs: Carb mod med cal, thin liquids Equipment Needs: TBD Pt/Family Agrees to Admission and willing to participate: Yes Program Orientation Provided & Reviewed with Pt/Caregiver Including Roles  & Responsibilities: Yes  Decrease burden of Care through IP rehab admission:  N/A  Possible need for SNF placement upon discharge: Not anticipated  Patient Condition: This patient's medical and functional status has changed since the consult dated: 08/28/14 in which the Rehabilitation Physician determined and documented that the patient's condition is appropriate for intensive rehabilitative care in an inpatient rehabilitation facility. See "History of Present Illness" (above) for medical update. Functional changes are: Currently requiring min assist to ambulate 20 ft RW. Patient's medical and functional status update has been discussed with the Rehabilitation physician and patient remains appropriate for inpatient rehabilitation. Will admit to inpatient rehab today.  Preadmission Screen Completed By:  Trish Mage, 08/31/2014 12:01 PM ______________________________________________________________________   Discussed status with Dr. Riley Kill on 08/31/14 at 1201 and received telephone approval for admission today.  Admission Coordinator:  Trish Mage, time1201/Date09/07/15

## 2014-08-31 NOTE — Progress Notes (Signed)
Patient demonstrates urinary frequency and strong odor to urine. Collected urine and sample sent to lab. Results show urine is positive for bacteria. Will get a urine culture as well. Patient states there is no discomfort with urination, only frequency. Will continue to monitor patient closely. Eliezer Champagne Michaeleen Down,RN

## 2014-08-31 NOTE — Progress Notes (Signed)
Occupational Therapy Treatment Patient Details Name: April [name] MRN: [id number] DOB: [birth year] Today's Date: 08/31/2014    History of present illness 80 y.o. female admitted to [facility] on 08/24/14 for elective PLIF L3-4 with dural tear. Pt with significant PMHx of HTN, DM, lupus, urinary frequency, and L hip arthroplasty 03/2014).    OT comments  Updated d/c plan to CIR this session. Pt progressing toward goals but demonstrates decr static standing and poor posture. Pt with fair recall of precautions. Ot to continue to follow acutely for adl retraining. Pt is unable to cross bil LE at this time.  Follow Up Recommendations  CIR    Equipment Recommendations  Other (comment)    Recommendations for Other Services Rehab consult    Precautions / Restrictions Precautions Precautions: Back;Fall Required Braces or Orthoses: Spinal Brace Spinal Brace: Lumbar corset;Applied in sitting position Restrictions Weight Bearing Restrictions: No       Mobility Bed Mobility               General bed mobility comments: in chair on arrival  Transfers Overall transfer level: Needs assistance Equipment used: Rolling walker (2 wheeled) Transfers: Sit to/from Stand Sit to Stand: Min assist         General transfer comment: cues for posture and reaching back to chair with descending to chair    Balance Overall balance assessment: Needs assistance Sitting-balance support: Bilateral upper extremity supported;Feet supported Sitting balance-Leahy Scale: Fair       Standing balance-Leahy Scale: Poor                     ADL Overall ADL's : Needs assistance/impaired Eating/Feeding: Modified independent;Sitting   Grooming: Moderate assistance;Standing Grooming Details (indicate cue type and reason): cues for upright posture     Lower Body Bathing: Maximal assistance;Sit to/from stand           Toilet Transfer: Moderate assistance;Ambulation;RW;BSC   Toileting-  Clothing Manipulation and Hygiene: Minimal assistance;Sit to/from stand Toileting - Clothing Manipulation Details (indicate cue type and reason): min (A) for peri care     Functional mobility during ADLs: Minimal assistance;Rolling walker General ADL Comments: Pt completed chair transfer with good hand placement. Pt required constant cues throughout session for posture. Pt with fatigue with more flexed posture. Pt completed toilet transfer, hand hygiene and oral care. Pt very brisk with sink level task due to incr pain and decr posture      Vision                     Perception     Praxis      Cognition   Behavior During Therapy: [facility] for tasks assessed/performed Overall Cognitive Status: Within Functional Limits for tasks assessed                       Extremity/Trunk Assessment               Exercises     Shoulder Instructions       General Comments      Pertinent Vitals/ Pain       Pain Assessment: 0-10 Pain Score: 5  Pain Location: Rt leg Pain Intervention(s): Repositioned  Home Living                                          Prior Functioning/Environment  Frequency Min 2X/week     Progress Toward Goals  OT Goals(current goals can now be found in the care plan section)  Progress towards OT goals: Progressing toward goals  Acute Rehab OT Goals Patient Stated Goal: get back to what she was doing before OT Goal Formulation: With patient Time For Goal Achievement: 09/02/14 Potential to Achieve Goals: Good ADL Goals Pt Will Perform Grooming: with min guard assist;standing Pt Will Perform Lower Body Dressing: with min assist;sit to/from stand;with adaptive equipment Pt Will Transfer to Toilet: with min assist;ambulating;bedside commode Pt Will Perform Toileting - Clothing Manipulation and hygiene: with min assist;sit to/from stand  Plan Discharge plan needs to be updated    Co-evaluation                  End of Session Equipment Utilized During Treatment: Gait belt;Back brace;Rolling walker   Activity Tolerance Patient tolerated treatment well   Patient Left in chair;with call bell/phone within reach;with chair alarm set   Nurse Communication Mobility status;Precautions        Time: 0950-1009 OT Time Calculation (min): 19 min  Charges: OT General Charges $OT Visit: 1 Procedure OT Treatments $Self Care/Home Management : 8-22 mins  Boone Master B 08/31/2014, 10:35 AM Pager: [phone]

## 2014-08-31 NOTE — Discharge Summary (Signed)
Physician Discharge Summary  Patient ID: April [name] MRN: [id number] DOB/AGE: 12/29/1924 80 y.o.  Admit date: 08/24/2014 Discharge date: 08/31/2014  Admission Diagnoses:Spinal stenosis and instability L 34  Discharge Diagnoses: Spinal stenosis and instability L 34 Active Problems:   Spinal stenosis of lumbar region   Discharged Condition: good  Hospital Course: Patient underwent decompression and fusion L 34 level  Consults: rehabilitation medicine  Significant Diagnostic Studies: None  Treatments: surgery: decompression and fusion L 34 level  Discharge Exam: Blood pressure 99/51, pulse 66, temperature 98.7 F (37.1 C), temperature source Oral, resp. rate 18, height  (1.651 m), weight 86.4 kg (190 lb 7.6 oz), SpO2 100.00%. Neurologic: Alert and oriented X 3, normal strength and tone. Normal symmetric reflexes. Normal coordination and gait Wound:CDI  Disposition: Rehab     Medication List         acidophilus Caps capsule  Take 1 capsule by mouth daily.     alendronate 70 MG tablet  Commonly known as:  FOSAMAX  Take 70 mg by mouth once a week. Take with a full glass of water on an empty stomach.     amLODipine 10 MG tablet  Commonly known as:  NORVASC  Take 1 tablet (10 mg total) by mouth daily.     atenolol 100 MG tablet  Commonly known as:  TENORMIN  Take 1 tablet (100 mg total) by mouth daily.     conjugated estrogens vaginal cream  Commonly known as:  PREMARIN  Place 1 Applicatorful vaginally every other day. Uses Monday, Wednesday, and Friday     CRANBERRY PO  Take 1 tablet by mouth daily.     docusate sodium 100 MG capsule  Commonly known as:  COLACE  Take 200 mg by mouth daily.     gemfibrozil 600 MG tablet  Commonly known as:  LOPID  Take 1 tablet (600 mg total) by mouth 2 (two) times daily before a meal.     HYDROcodone-acetaminophen 5-325 MG per tablet  Commonly known as:  NORCO/VICODIN  Take 1-2 tablets by mouth every 4 (four)  hours as needed for moderate pain.     insulin glargine 100 UNIT/ML injection  Commonly known as:  LANTUS  Inject 10 Units into the skin at bedtime.     levothyroxine 125 MCG tablet  Commonly known as:  SYNTHROID, LEVOTHROID  Take 1 tablet (125 mcg total) by mouth daily before breakfast.     lisinopril 40 MG tablet  Commonly known as:  PRINIVIL,ZESTRIL  Take 1 tablet (40 mg total) by mouth daily.     lovastatin 40 MG tablet  Commonly known as:  MEVACOR  Take 40 mg by mouth daily.     mupirocin ointment 2 %  Commonly known as:  BACTROBAN  Place 1 application into the nose 2 (two) times daily. For 5 days     Vitamin D (Ergocalciferol) 50000 UNITS Caps capsule  Commonly known as:  DRISDOL  Take 50,000 Units by mouth as needed.         Signed: Dorian Heckle, MD 08/31/2014, 11:40 AM

## 2014-08-31 NOTE — Progress Notes (Signed)
Rehab admission - Evaluated for possible admission.  I met with patient and 2 daughters.  Patient and daughters would like acute inpatient rehab admission.  Bed available today on rehab and will admit to inpatient rehab today.  Call me for questions.  #[phone]

## 2014-08-31 NOTE — Progress Notes (Signed)
1445 Pt. Arrived to rehab from [facility].  Report received from [place], [place].   VSS obtained, BA set and patient oriented to rehab.

## 2014-08-31 NOTE — Progress Notes (Signed)
Subjective: Patient reports dysuria  Objective: Vital signs in last 24 hours: Temp:  [98.5 F (36.9 C)-99 F (37.2 C)] 98.7 F (37.1 C) (09/07 1027) Pulse Rate:  [63-69] 66 (09/07 1027) Resp:  [18] 18 (09/07 1027) BP: (99-182)/(51-72) 99/51 mmHg (09/07 1027) SpO2:  [91 %-100 %] 100 % (09/07 1027)  Intake/Output from previous day: 09/06 0701 - 09/07 0700 In: 250 [P.O.:240; I.V.:10] Out: -  Intake/Output this shift:    Physical Exam:  Lab Results: No results found for this basename: WBC, HGB, HCT, PLT,  in the last 72 hours BMET No results found for this basename: NA, K, CL, CO2, GLUCOSE, BUN, CREATININE, CALCIUM,  in the last 72 hours  Studies/Results: No results found.  Assessment/Plan: Patient with bacteria on U/A.  Started on cipro.  Transfer to Rehab today.    LOS: 7 days    Dorian Heckle, MD 08/31/2014, 11:34 AM

## 2014-08-31 NOTE — Discharge Instructions (Signed)
Spinal Fusion Spinal fusion is a procedure to make 2 or more of the bones in your spinal column (vertebrae) grow together (fuse). This procedure stops movement between the vertebrae and can relieve pain and prevent deformity.  Spinal fusion is used to treat the following conditions:  Fractures of the spine.  Herniated disk (the spongy material [cartilage] between the vertebrae).  Abnormal curvatures of the spine, such as scoliosis or kyphosis.  A weak or an unstable spine, caused by infections or tumor. RISKS AND COMPLICATIONS Complications associated with spinal fusion are rare, but they can occur. Possible complications include:  Bleeding.  Infection near the incision.  Nerve damage. Signs of nerve damage are back pain, pain in one or both legs, weakness, or numbness.  Spinal fluid leakage.  Blood clot in your leg, which can move to your lungs.  Difficulty controlling urination or bowel movements. BEFORE THE PROCEDURE  A medical evaluation will be done. This will include a physical exam, blood tests, and imaging exams.  You will talk with an anesthesiologist. This is the person who will be in charge of the anesthesia during the procedure. Spinal fusion usually requires that you are asleep during the procedure (general anesthesia).  You will need to stop taking certain medicines, particularly those associated with an increased risk of bleeding. Ask your caregiver about changing or stopping your regular medicines.  If you smoke, you will need to stop at least 2 weeks before the procedure. Smoking can slow down the healing process, especially fusion of the vertebrae, and increase the risk of complications.  Do not eat or drink anything for at least 8 hours before the procedure. PROCEDURE  A cut (incision) is made over the vertebrae that will be fused. The back muscles are separated from the vertebrae. If you are having this procedure to treat a herniated disk, the disc material  pressing on the nerve root is removed (decompression). The area where the disk is removed is then filled with extra bone. Bone from another part of your body (autogenous bone) or bone from a bone donor (allograft bone) may be used. The extra bone promotes fusion between the vertebrae. Sometimes, specific medicines are added to the fusion area to promote bone healing. In most cases, screws and rods or metal plates will be used to attach the vertebrae to stabilize them while they fuse.  AFTER THE PROCEDURE   You will stay in a recovery area until the anesthesia has worn off. Your blood pressure and pulse will be checked frequently.  You will be given antibiotics to prevent infection.  You may continue to receive fluids through an intravenous (IV) tube while you are still in the hospital.  Pain after surgery is normal. You will be given pain medicine.  You will be taught how to move correctly and how to stand and walk. While in bed, you will be instructed to turn frequently, using a "log rolling" technique, in which the entire body is moved without twisting the back. Document Released: 09/09/2003 Document Revised: 03/04/2012 Document Reviewed: 02/23/2011 [facility] Patient Information 2015 [place], [place]. This information is not intended to replace advice given to you by your health care provider. Make sure you discuss any questions you have with your health care provider.  Spinal Fusion Care After  These instructions give you information on caring for yourself after your procedure. Your doctor may also give you more specific instructions. Call your doctor if you have any problems or questions after your procedure. HOME CARE  Only take medicines as told by your doctor.  Do not drive if you are taking certain pain medicines (narcotics).  Change your bandage (dressing) as told by your doctor.  Do not get the wound wet. Do not take baths or swim until your doctor says it is okay.  Check the  wound often for redness, puffiness (swelling), or leaking fluids.  Ask your doctor what activities you should avoid and for how long.  Walk as much as you can.  Do not sit for long periods of time. Change positions every hour.  Do not lift anything heavier than 5 to 10 pounds (2.25 to 4.5 kilograms) until your doctor says it is safe.  Do not twist or bend for 6 weeks. Try not to pull on things. Do not hold things away from your body.  Ask your doctor what exercises you should do. Exercises can help make the back stronger. GET HELP RIGHT AWAY IF:  Your pain suddenly gets worse.  The wound is red, puffy, bleeding, or leaking fluid.  Your legs or feet are painful, puffy, weak, or lose feeling (numb).  You cannot control when you pee (urinate) or poop (bowel movement).  You have trouble breathing.  You have chest pain.  You have a temperature by mouth above 102 F (38.9 C), not controlled by medicine. MAKE SURE YOU:  Understand these instructions.  Will watch your condition.  Will get help right away if you are not doing well or get worse. Document Released: 04/06/2011 Document Revised: 03/04/2012 Document Reviewed: 04/06/2011 [facility] Patient Information 2015 [place], [place]. This information is not intended to replace advice given to you by your health care provider. Make sure you discuss any questions you have with your health care provider.

## 2014-09-01 ENCOUNTER — Inpatient Hospital Stay (HOSPITAL_COMMUNITY): Payer: Self-pay

## 2014-09-01 ENCOUNTER — Inpatient Hospital Stay (HOSPITAL_COMMUNITY): Payer: Worker's Compensation

## 2014-09-01 DIAGNOSIS — Z5189 Encounter for other specified aftercare: Secondary | ICD-10-CM

## 2014-09-01 DIAGNOSIS — M48061 Spinal stenosis, lumbar region without neurogenic claudication: Secondary | ICD-10-CM

## 2014-09-01 DIAGNOSIS — E1149 Type 2 diabetes mellitus with other diabetic neurological complication: Secondary | ICD-10-CM

## 2014-09-01 DIAGNOSIS — IMO0002 Reserved for concepts with insufficient information to code with codable children: Secondary | ICD-10-CM

## 2014-09-01 DIAGNOSIS — E1142 Type 2 diabetes mellitus with diabetic polyneuropathy: Secondary | ICD-10-CM

## 2014-09-01 LAB — CBC WITH DIFFERENTIAL/PLATELET
Basophils Absolute: 0 10*3/uL (ref 0.0–0.1)
Basophils Relative: 0 % (ref 0–1)
Eosinophils Absolute: 0.1 10*3/uL (ref 0.0–0.7)
Eosinophils Relative: 1 % (ref 0–5)
HCT: 29.1 % — ABNORMAL LOW (ref 36.0–46.0)
HEMOGLOBIN: 9.8 g/dL — AB (ref 12.0–15.0)
LYMPHS ABS: 2 10*3/uL (ref 0.7–4.0)
Lymphocytes Relative: 19 % (ref 12–46)
MCH: 31.6 pg (ref 26.0–34.0)
MCHC: 33.7 g/dL (ref 30.0–36.0)
MCV: 93.9 fL (ref 78.0–100.0)
MONOS PCT: 6 % (ref 3–12)
Monocytes Absolute: 0.7 10*3/uL (ref 0.1–1.0)
NEUTROS ABS: 7.9 10*3/uL — AB (ref 1.7–7.7)
NEUTROS PCT: 74 % (ref 43–77)
Platelets: 284 10*3/uL (ref 150–400)
RBC: 3.1 MIL/uL — ABNORMAL LOW (ref 3.87–5.11)
RDW: 14.3 % (ref 11.5–15.5)
WBC: 10.7 10*3/uL — AB (ref 4.0–10.5)

## 2014-09-01 LAB — COMPREHENSIVE METABOLIC PANEL
ALT: 5 U/L (ref 0–35)
ANION GAP: 12 (ref 5–15)
AST: 9 U/L (ref 0–37)
Albumin: 2.3 g/dL — ABNORMAL LOW (ref 3.5–5.2)
Alkaline Phosphatase: 99 U/L (ref 39–117)
BILIRUBIN TOTAL: 0.5 mg/dL (ref 0.3–1.2)
BUN: 24 mg/dL — AB (ref 6–23)
CHLORIDE: 100 meq/L (ref 96–112)
CO2: 25 mEq/L (ref 19–32)
Calcium: 8.7 mg/dL (ref 8.4–10.5)
Creatinine, Ser: 0.98 mg/dL (ref 0.50–1.10)
GFR calc non Af Amer: 51 mL/min — ABNORMAL LOW (ref >90)
GFR, EST AFRICAN AMERICAN: 59 mL/min — AB (ref >90)
GLUCOSE: 115 mg/dL — AB (ref 70–99)
POTASSIUM: 4.1 meq/L (ref 3.7–5.3)
SODIUM: 137 meq/L (ref 137–147)
TOTAL PROTEIN: 6.4 g/dL (ref 6.0–8.3)

## 2014-09-01 LAB — GLUCOSE, CAPILLARY
GLUCOSE-CAPILLARY: 138 mg/dL — AB (ref 70–99)
Glucose-Capillary: 135 mg/dL — ABNORMAL HIGH (ref 70–99)
Glucose-Capillary: 176 mg/dL — ABNORMAL HIGH (ref 70–99)
Glucose-Capillary: 126 mg/dL — ABNORMAL HIGH (ref 70–99)

## 2014-09-01 MED ORDER — ESTROGENS, CONJUGATED 0.625 MG/GM VA CREA
1.0000 | TOPICAL_CREAM | VAGINAL | Status: DC
  Administered 2014-09-03 – 2014-09-07 (×2): 1 via VAGINAL
  Filled 2014-09-01: qty 42.5

## 2014-09-01 MED ORDER — SODIUM CHLORIDE 0.9 % IJ SOLN
10.0000 mL | INTRAMUSCULAR | Status: DC | PRN
  Administered 2014-09-01 – 2014-09-08 (×10): 10 mL

## 2014-09-01 NOTE — Progress Notes (Signed)
Physical Medicine and Rehabilitation Consult  Reason for Consult: L3/4 spinal stenosis with BLE radiculopathy  Referring Physician: Dr. Wynetta Emery  HPI: April [name] is a 80 y.o. female with h/o HTN, DM type 2, fall at work 03/2104 with hip fracture--CIR stay with progress to supervision level. She has had progressive worsening back and bilateral leg pain rating down to the front of her shin adjacent of her foot consistent with predominately and L4 nerve root pattern. Workup revealed severe spinal stenosis with virtually complete CSF block at L3-4 with a virtually autofused L5-S1 and patient elected to undergo decompressive lam L3/4 with fusion and repair of inadvertent dural tear by Dr. Wynetta Emery on 08/24/14. Post op on bedrest till 08/26/14. She has problems with urinary retention. LE weakness as well as pain affecting mobility. MD recommending CIR for follow up therapy.  Patient reports that she has been sedentary since d/c from rehab. Has continued to require walker due to left hip and back pain. Independent for ADLs as well as mobility with RW.  Review of Systems  Constitutional: Positive for malaise/fatigue.  HENT: Negative for hearing loss.  Eyes: Positive for blurred vision.  Respiratory: Negative for cough and shortness of breath.  Cardiovascular: Negative for chest pain and palpitations.  Gastrointestinal: Positive for abdominal pain. Negative for heartburn.  Musculoskeletal: Positive for back pain, joint pain (chronic left hip pain due to Fx) and myalgias.  Neurological: Positive for weakness. Negative for dizziness and headaches.  Psychiatric/Behavioral: The patient is nervous/anxious.   Past Medical History   Diagnosis  Date   .  Hyperlipidemia  04/07/2014     takes Lovastatin daily   .  OA (osteoarthritis)  04/07/2014   .  Hypertension      takes Amlodipine and Atenolol daily   .  HTN (hypertension)  04/07/2014   .  Diabetes mellitus without complication      takes Lantus daily   .   Hypothyroidism  04/07/2014     takes Synthroid daily   .  History of bronchitis  20 yrs ago   .  History of lupus  105yrs ago   .  Joint pain    .  Peripheral edema    .  UTI (lower urinary tract infection)      completed antibioitcs on 07/28/14   .  Urinary frequency      takes Vesicare daily   .  Chronic back pain      stenosis   .  Constipation      takes an OTC stool softener   .  GERD (gastroesophageal reflux disease)      not on Protonix daily   .  Nocturia    .  Cataract      left eye   .  History of shingles     Past Surgical History   Procedure  Laterality  Date   .  Cholecystectomy     .  Hip arthroplasty  Left  04/08/2014     Procedure: LEFT HIP HEMIARTHROPLASTY; Surgeon: Verlee Rossetti, MD; Location: [facility] OR; Service: Orthopedics; Laterality: Left;   .  Tonsillectomy     .  Adenoidectomy     .  Left cataract removed     .  Eye surgery      History reviewed. No pertinent family history.  Social History: Daughter lives with patient. Patient was working prior to fall 03/2014. She reports that she has never smoked. She has never used smokeless tobacco. She reports  that she does not drink alcohol or use illicit drugs.  Allergies: No Known Allergies  Medications Prior to Admission   Medication  Sig  Dispense  Refill   .  acidophilus (RISAQUAD) CAPS capsule  Take 1 capsule by mouth daily.     Marland Kitchen  alendronate (FOSAMAX) 70 MG tablet  Take 70 mg by mouth once a week. Take with a full glass of water on an empty stomach.     Marland Kitchen  amLODipine (NORVASC) 10 MG tablet  Take 1 tablet (10 mg total) by mouth daily.  30 tablet  1   .  atenolol (TENORMIN) 100 MG tablet  Take 1 tablet (100 mg total) by mouth daily.  30 tablet  1   .  conjugated estrogens (PREMARIN) vaginal cream  Place 1 Applicatorful vaginally every other day. Uses Monday, Wednesday, and Friday     .  CRANBERRY PO  Take 1 tablet by mouth daily.     Marland Kitchen  docusate sodium (COLACE) 100 MG capsule  Take 200 mg by mouth daily.     Marland Kitchen   gemfibrozil (LOPID) 600 MG tablet  Take 1 tablet (600 mg total) by mouth 2 (two) times daily before a meal.  60 tablet  1   .  HYDROcodone-acetaminophen (NORCO/VICODIN) 5-325 MG per tablet  Take 1-2 tablets by mouth every 4 (four) hours as needed for moderate pain.     Marland Kitchen  insulin glargine (LANTUS) 100 UNIT/ML injection  Inject 10 Units into the skin at bedtime.     Marland Kitchen  levothyroxine (SYNTHROID, LEVOTHROID) 125 MCG tablet  Take 1 tablet (125 mcg total) by mouth daily before breakfast.  30 tablet  1   .  lisinopril (PRINIVIL,ZESTRIL) 40 MG tablet  Take 1 tablet (40 mg total) by mouth daily.  30 tablet  1   .  lovastatin (MEVACOR) 40 MG tablet  Take 40 mg by mouth daily.     .  mupirocin ointment (BACTROBAN) 2 %  Place 1 application into the nose 2 (two) times daily. For 5 days     .  Vitamin D, Ergocalciferol, (DRISDOL) 50000 UNITS CAPS capsule  Take 50,000 Units by mouth as needed.      Home:  Home Living  Family/patient expects to be discharged to:: Skilled nursing facility  Living Arrangements: Children  Available Help at Discharge: Family;Available 24 hours/day  Type of Home: House  Home Access: Stairs to enter  [employer] of Steps: 4  Entrance Stairs-Rails: Right;Left  Home Layout: One level  Home Equipment: Tub bench;Adaptive equipment  Adaptive Equipment: Reacher;Sock aid;Long-handled shoe horn;Long-handled sponge  Functional History:  Prior Function  Level of Independence: Independent  Functional Status:  Mobility:  Bed Mobility  Overal bed mobility: Needs Assistance  Bed Mobility: Sit to Sidelying  Rolling: Supervision  Sidelying to sit: Max assist  Sit to sidelying: Min assist  General bed mobility comments: Min assist to lift legs into the bed. Pt remembered to go to her side, but needed cues for hand placement on the bed flat with no rails to avoid twisting.  Transfers  Overall transfer level: Needs assistance  Equipment used: Rolling walker (2 wheeled)   Transfers: Sit to/from Stand  Sit to Stand: +2 physical assistance;Min assist  Stand pivot transfers: +2 physical assistance;Max assist  General transfer comment: Two person min assist used, but likely only one assist needed. Pt using armrests appropriately to push up from the chair.  Ambulation/Gait  Ambulation/Gait assistance: Min assist;Mod assist  Ambulation Distance (Feet): 20 Feet  Assistive device: Rolling walker (2 wheeled)  Gait Pattern/deviations: Step-through pattern;Shuffle  Gait velocity: decreased  Gait velocity interpretation: <1.8 ft/sec, indicative of risk for recurrent falls  General Gait Details: Pt with shuffling gait pattern. She reports her left leg is the weakest and most painful leg during gait. Verbal cues for upright posture and for safe use of RW (stay inside). Pt with limited gait tolerance, and increased knee flexion as she fatigues. Mod assist at the end of gait to support trunk over weak legs.   ADL:  ADL  Overall ADL's : Needs assistance/impaired  Upper Body Dressing : Moderate assistance;Sitting  Lower Body Dressing: Maximal assistance;Sit to/from stand;With adaptive equipment  Toilet Transfer: Maximal assistance;+2 for physical assistance;Stand-pivot;RW (from bed to chair)  Functional mobility during ADLs: +2 for physical assistance;Rolling walker;Maximal assistance (stand pivot)  General ADL Comments: Practiced with AE for LB ADLs. Educated on back brace wear. Pt donned panties with reacher.  Cognition:  Cognition  Overall Cognitive Status: Within Functional Limits for tasks assessed  Orientation Level: Oriented X4  Cognition  Arousal/Alertness: Awake/alert  Behavior During Therapy: WFL for tasks assessed/performed  Overall Cognitive Status: Within Functional Limits for tasks assessed  Blood pressure 129/44, pulse 56, temperature 97.8 F (36.6 C), temperature source Oral, resp. rate 16, height  (1.651 m), weight 86.4 kg (190 lb 7.6 oz), SpO2  100.00%.  Physical Exam  Nursing note and vitals reviewed.  Constitutional: She is oriented to person, place, and time. She appears well-developed and well-nourished.  HENT:  Head: Normocephalic and atraumatic.  Eyes: Conjunctivae are normal. Pupils are equal, round, and reactive to light.  Neck: Normal range of motion. Neck supple.  Cardiovascular: Normal rate and regular rhythm.  Respiratory: Effort normal and breath sounds normal. No respiratory distress. She has no wheezes.  GI: Soft. Bowel sounds are normal. She exhibits no distension. There is no tenderness.  Musculoskeletal: She exhibits no edema.  Back pain with SLR BLE. Left hip pain with ROM at hip.  Neurological: She is alert and oriented to person, place, and time. No cranial nerve deficit. Coordination normal.  Good sitting balance. Mild weakness in left quads, hip adductors. Pain in L3 distribution, left leg. No focal sensory loss is appreciated however.  Skin: Skin is warm and dry.  Psychiatric: She has a normal mood and affect. Her behavior is normal. Thought content normal.   Results for orders placed during the hospital encounter of 08/24/14 (from the past 24 hour(s))   GLUCOSE, CAPILLARY Status: Abnormal    Collection Time    08/27/14 11:24 AM   Result  Value  Ref Range    Glucose-Capillary  212 (*)  70 - 99 mg/dL    Comment 1  Documented in Chart     Comment 2  Notify RN    GLUCOSE, CAPILLARY Status: Abnormal    Collection Time    08/27/14 4:54 PM   Result  Value  Ref Range    Glucose-Capillary  228 (*)  70 - 99 mg/dL    Comment 1  Documented in Chart     Comment 2  Notify RN    GLUCOSE, CAPILLARY Status: Abnormal    Collection Time    08/27/14 9:36 PM   Result  Value  Ref Range    Glucose-Capillary  236 (*)  70 - 99 mg/dL    Comment 1  Documented in Chart     Comment 2  Notify RN  GLUCOSE, CAPILLARY Status: None    Collection Time    08/28/14 6:46 AM   Result  Value  Ref Range    Glucose-Capillary   91  70 - 99 mg/dL    Comment 1  Documented in Chart     Comment 2  Notify RN     No results found.  Assessment/Plan:  Diagnosis: Left Lumbar 3 radiculopathy/stenosis s/p L3-4 laminectomy and fusion,  1. Does the need for close, 24 hr/day medical supervision in concert with the patient's rehab needs make it unreasonable for this patient to be served in a less intensive setting? Yes 2. Co-Morbidities requiring supervision/potential complications: htn, cm, oa, hx of left hip fx 3. Due to bladder management, bowel management, safety, skin/wound care, disease management, medication administration, pain management and patient education, does the patient require 24 hr/day rehab nursing? Yes 4. Does the patient require coordinated care of a physician, rehab nurse, PT (1-2 hrs/day, 5 days/week) and OT (1-2 hrs/day, 5 days/week) to address physical and functional deficits in the context of the above medical diagnosis(es)? Yes Addressing deficits in the following areas: balance, endurance, locomotion, strength, transferring, bowel/bladder control, bathing, dressing, feeding, grooming and toileting 5. Can the patient actively participate in an intensive therapy program of at least 3 hrs of therapy per day at least 5 days per week? Yes 6. The potential for patient to make measurable gains while on inpatient rehab is excellent 7. Anticipated functional outcomes upon discharge from inpatient rehab are modified independent with PT, modified independent with OT, modified independent with SLP. 8. Estimated rehab length of stay to reach the above functional goals is: 7 days 9. Does the patient have adequate social supports to accommodate these discharge functional goals? Yes 10. Anticipated D/C setting: Home 11. Anticipated post D/C treatments: HH therapy 12. Overall Rehab/Functional Prognosis: excellent RECOMMENDATIONS:  This patient's condition is appropriate for continued rehabilitative care in the following  setting: CIR  Patient has agreed to participate in recommended program. Yes  Note that insurance prior authorization may be required for reimbursement for recommended care.  Comment: Rehab Admissions Coordinator to follow up.  Thanks,  Ranelle Oyster, MD, [place] [facility]  08/28/2014  Revision History...      Date/Time User Action    08/28/2014 2:33 PM Ranelle Oyster, MD Sign    08/28/2014 1:16 PM Jacquelynn Cree, PA-C Share   View Details Report    Routing History...      Date/Time From To Method    08/28/2014 2:33 PM Ranelle Oyster, MD Ranelle Oyster, MD In Basket    08/28/2014 2:33 PM Ranelle Oyster, MD Juliette Alcide, MD Fax

## 2014-09-01 NOTE — Progress Notes (Signed)
Orthopedic Tech Progress Note Patient Details:  April [name] 07/23/1925 [id number] Spoke with nurse, patient has back brace Patient ID: [name], female   DOB: [birth year], 80 y.o.   MRN: [id number]   Orie Rout 09/01/2014, 11:20 AM

## 2014-09-01 NOTE — Patient Care Conference (Signed)
Inpatient RehabilitationTeam Conference and Plan of Care Update Date: 09/01/2014   Time: 2:05  PM    Patient Name: April [name]      Medical Record Number: [id number]  Date of Birth: 1925-04-01 Sex: Female         Room/Bed: 4W23C/4W23C-01 Payor Info: Payor: MEDICARE / Plan: MEDICARE PART A AND B / Product Type: *No Product type* /    Admitting Diagnosis: rehab  Admit Date/Time:  08/31/2014  2:49 PM Admission Comments: No comment available   Primary Diagnosis:  <principal problem not specified> Principal Problem: <principal problem not specified>  Patient Active Problem List   Diagnosis Date Noted  . Left lumbar radiculopathy 08/31/2014  . Spinal stenosis of lumbar region 08/24/2014  . Hip fracture 04/10/2014  . Fracture of femoral neck, left 04/08/2014  . Left displaced femoral neck fracture 04/07/2014  . HTN (hypertension) 04/07/2014  . Hyperlipidemia 04/07/2014  . DM (diabetes mellitus) 04/07/2014  . Hypothyroidism 04/07/2014  . OA (osteoarthritis) 04/07/2014  . UTI (lower urinary tract infection) 04/07/2014  . Dehydration 04/07/2014    Expected Discharge Date: Expected Discharge Date: 09/08/14  Team Members Present: Physician leading conference: Dr. Faith Rogue Social Worker Present: Amada Jupiter, LCSW;Jenny Prevatt, LCSW Nurse Present: Carlean Purl, RN PT Present: Edman Circle, PT;Alison Orson Slick, PT OT Present: Donzetta Kohut, OT;Patricia Mat Carne, OT SLP Present: Feliberto Gottron, SLP Other (Discipline and Name): Ottie Glazier, RN [facility] PPS Coordinator present : Tora Duck, RN, [facility]     Current Status/Progress Goal Weekly Team Focus  Medical   lumbar radic, decompression/fusion. pain issues, anxiety  improve pain/anxiety  improve endurance, safety   Bowel/Bladder   Constipation corrected, continent of bowel and bladder frequent urination d/t Uti, LBM 09/01/14  Remain continent or bowel and bladder  Avoid reoccurance of constipation   Swallow/Nutrition/  Hydration             ADL's   Overall Mod Assist with BADL, min assist with transfers  Overall Min Assist with BADL, supervision for transfers  Endurance, adherence to back precautions during BADL, transfers, UE strengthening   Mobility   Overall minA, some ModA with supine<>sit depending on setup  (S) overall with mod (I) for bed mobility  anxiety, activity tolerance, maintaining precautions during bed mobility   Communication             Safety/Cognition/ Behavioral Observations            Pain   Patient requesting pain meds 2-3 times daily  Keep pain at a level of 3 or lower on a scale of 0-10  Assess for pain q4h and prn   Skin   Stage I on the sacrum, no other skin issues noted  Prevent fuirther skin breakdown and take measures to hela the current decubitis  Assess skin q shift and prn, clean the sacral area and apply barrier cream as needed.    Rehab Goals Patient on target to meet rehab goals: Yes *See Care Plan and progress notes for long and short-term goals.  Barriers to Discharge: left hip pain, safety    Possible Resolutions to Barriers:  safety training, use of equipment    Discharge Planning/Teaching Needs:  Plan home with daughters providing 24/7 care      Team Discussion:  New eval.  Pt on CIR previously 4/15.  Good 1st day with tx and ambulated 65'.  Limited by pain and mild anxiety.  Supervision goals overall.  May need light assist with ADLs  Revisions to Treatment Plan:  None   Continued Need for Acute Rehabilitation Level of Care: The patient requires daily medical management by a physician with specialized training in physical medicine and rehabilitation for the following conditions: Daily direction of a multidisciplinary physical rehabilitation program to ensure safe treatment while eliciting the highest outcome that is of practical value to the patient.: Yes Daily medical management of patient stability for increased activity during participation in an  intensive rehabilitation regime.: Yes Daily analysis of laboratory values and/or radiology reports with any subsequent need for medication adjustment of medical intervention for : Post surgical problems;Neurological problems  Cougar Imel 09/01/2014, 6:21 PM

## 2014-09-01 NOTE — Progress Notes (Signed)
PMR Admission Coordinator Pre-Admission Assessment  Patient: April [name] is an 80 y.o., female  MRN: [id number]  DOB: 01/14/1925  Height:  (165.1 cm)  Weight: 86.4 kg (190 lb 7.6 oz)  Insurance Information  HMO: PPO: PCP: IPA: 80/20: OTHER:  PRIMARY: Workers comp Policy#: [id number] Subscriber: Cleda Daub  CM Name: Dorette Grate Phone#: [phone] Fax#:  Pre-Cert#: Employer: [employer]  Benefits: Phone #: [phone] Name:  Eff. Date: 04/08/14 accident date Deduct: Out of Pocket Max: Life Max:  CIR: SNF:  Outpatient: Co-Pay:  Home Health: Co-Pay:  DME: Co-Pay:  Providers:  Insurance adjuster is Marlowe Sax - [phone]  Emergency Contact Information  Contact Information    Name  Relation  Home  Work  Mobile    Smith,Sharon  Daughter  [phone]      Marna, Weniger  [phone]      Epling,Judy  Daughter  [phone]   [phone]      Current Medical History  Patient Admitting Diagnosis: Left Lumbar 3 radiculopathy/stenosis s/p L3-4 laminectomy and fusion  History of Present Illness: An 80 y.o. female with h/o HTN, DM type 2, fall at work 04/07/14 with hip fracture--CIR stay with progress to supervision level. She has had progressive worsening back and bilateral leg pain rating down to the front of her shin adjacent of her foot consistent with predominately and L4 nerve root pattern. Workup revealed severe spinal stenosis with virtually complete CSF block at L3-4 with a virtually autofused L5-S1 and patient elected to undergo decompressive lam L3/4 with fusion and repair of inadvertent dural tear by Dr. Wynetta Emery on 08/24/14. Post op on bedrest till 08/26/14. She has had problems with urinary retention. LE weakness as well as pain affecting mobiliy. She has had hypoglycemic episodes and lantus decrease to 10 units. UA done due to frequency and shows evidence of bacteria. PT/OT evaluations completed with recommendations for inpatient rehab.   Past Medical History  Past Medical History   Diagnosis  Date   .  Hyperlipidemia  04/07/2014     takes Lovastatin daily   .  OA (osteoarthritis)  04/07/2014   .  Hypertension      takes Amlodipine and Atenolol daily   .  HTN (hypertension)  04/07/2014   .  Diabetes mellitus without complication      takes Lantus daily   .  Hypothyroidism  04/07/2014     takes Synthroid daily   .  History of bronchitis  20 yrs ago   .  History of lupus  61yrs ago   .  Joint pain    .  Peripheral edema    .  UTI (lower urinary tract infection)      completed antibioitcs on 07/28/14   .  Urinary frequency      takes Vesicare daily   .  Chronic back pain      stenosis   .  Constipation      takes an OTC stool softener   .  GERD (gastroesophageal reflux disease)      not on Protonix daily   .  Nocturia    .  Cataract      left eye   .  History of shingles     Family History  family history is not on file.  Prior Rehab/Hospitalizations: Was on CIR 03/2014 after hip fracture.  Current Medications  Current facility-administered medications:0.45 % NaCl with KCl 20 mEq / L infusion, , Intravenous, Continuous, Mariam Dollar, MD, Last  Rate: 10 mL/hr at 08/27/14 0307, 1,000 mL at 08/27/14 0307; 0.9 % sodium chloride infusion, 250 mL, Intravenous, Continuous, Mariam Dollar, MD; acetaminophen (TYLENOL) suppository 650 mg, 650 mg, Rectal, Q4H PRN, Mariam Dollar, MD; acetaminophen (TYLENOL) tablet 650 mg, 650 mg, Oral, Q4H PRN, Mariam Dollar, MD  alum & mag hydroxide-simeth (MAALOX/MYLANTA) 200-200-20 MG/5ML suspension 30 mL, 30 mL, Oral, Q6H PRN, Mariam Dollar, MD; amLODipine (NORVASC) tablet 10 mg, 10 mg, Oral, Daily, Mariam Dollar, MD, 10 mg at 08/31/14 1610; aspirin EC tablet 81 mg, 81 mg, Oral, Daily, Mariam Dollar, MD, 81 mg at 08/31/14 0949; atenolol (TENORMIN) tablet 100 mg, 100 mg, Oral, Daily, Mariam Dollar, MD, 100 mg at 08/31/14 9604  bisacodyl (DULCOLAX) suppository 10 mg, 10 mg, Rectal, Daily PRN, Mariam Dollar, MD;  ciprofloxacin (CIPRO) tablet 500 mg, 500 mg, Oral, BID, Maeola Harman, MD; conjugated estrogens (PREMARIN) vaginal cream 1 Applicatorful, 1 Applicatorful, Vaginal, QODAY, Mariam Dollar, MD, 1 Applicatorful at 08/30/14 0955; docusate sodium (COLACE) capsule 200 mg, 200 mg, Oral, Daily, Mariam Dollar, MD, 200 mg at 08/31/14 0951  gemfibrozil (LOPID) tablet 600 mg, 600 mg, Oral, BID AC, Mariam Dollar, MD, 600 mg at 08/31/14 0948; HYDROcodone-acetaminophen (NORCO/VICODIN) 5-325 MG per tablet 1-2 tablet, 1-2 tablet, Oral, Q4H PRN, Mariam Dollar, MD, 2 tablet at 08/29/14 0401; HYDROmorphone (DILAUDID) injection 0.5-1 mg, 0.5-1 mg, Intravenous, Q2H PRN, Mariam Dollar, MD, 1 mg at 08/27/14 0306  HYDROmorphone (DILAUDID) tablet 2 mg, 2 mg, Oral, Q3H PRN, Mariam Dollar, MD, 2 mg at 08/31/14 1131; insulin glargine (LANTUS) injection 10 Units, 10 Units, Subcutaneous, QHS, Mariam Dollar, MD, 10 Units at 08/30/14 2215; levothyroxine (SYNTHROID, LEVOTHROID) tablet 125 mcg, 125 mcg, Oral, QAC breakfast, Mariam Dollar, MD, 125 mcg at 08/31/14 0743; lisinopril (PRINIVIL,ZESTRIL) tablet 40 mg, 40 mg, Oral, Daily, Mariam Dollar, MD, 40 mg at 08/31/14 5409  menthol-cetylpyridinium (CEPACOL) lozenge 3 mg, 1 lozenge, Oral, PRN, Mariam Dollar, MD; ondansetron [facility]) injection 4 mg, 4 mg, Intravenous, Q4H PRN, Mariam Dollar, MD; oxyCODONE-acetaminophen (PERCOCET/ROXICET) 5-325 MG per tablet 1-2 tablet, 1-2 tablet, Oral, Q4H PRN, Mariam Dollar, MD, 2 tablet at 08/29/14 1512; pantoprazole (PROTONIX) EC tablet 40 mg, 40 mg, Oral, BID, Mariam Dollar, MD, 40 mg at 08/31/14 0949  phenol (CHLORASEPTIC) mouth spray 1 spray, 1 spray, Mouth/Throat, PRN, Mariam Dollar, MD; polyethylene glycol (MIRALAX / GLYCOLAX) packet 17 g, 17 g, Oral, Daily PRN, Mariam Dollar, MD, 17 g at 08/31/14 8119; senna (SENOKOT) tablet 8.6 mg, 1 tablet, Oral, BID, Mariam Dollar, MD, 8.6 mg at 08/31/14 0949; simvastatin (ZOCOR) tablet 5 mg, 5 mg, Oral, q1800, Mariam Dollar, MD, 5 mg at 08/30/14 1731   sodium chloride 0.9 % injection 10-40 mL, 10-40 mL, Intracatheter, Q12H, Mariam Dollar, MD; sodium chloride 0.9 % injection 10-40 mL, 10-40 mL, Intracatheter, PRN, Mariam Dollar, MD, 10 mL at 08/30/14 0943; sodium chloride 0.9 % injection 3 mL, 3 mL, Intravenous, Q12H, Mariam Dollar, MD, 3 mL at 08/25/14 1044; sodium chloride 0.9 % injection 3 mL, 3 mL, Intravenous, PRN, Mariam Dollar, MD  [START ON 09/07/2014] Vitamin D (Ergocalciferol) (DRISDOL) capsule 50,000 Units, 50,000 Units, Oral, Q7 days, Mariam Dollar, MD  Patients Current Diet: Carb Control  Precautions / Restrictions  Precautions  Precautions: Back;Fall  Precaution Booklet Issued: Yes (comment)  Precaution Comments: pt able to report 3/3 back precautions.  Spinal Brace: Lumbar  corset;Applied in sitting position  Restrictions  Weight Bearing Restrictions: No  Prior Activity Level  Community (5-7x/wk): Went out daily. 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  Home Assistive Devices / Equipment  Home Assistive Devices/Equipment: [employer];Wheelchair;Walker (specify type)  Home Equipment: Tub bench;Adaptive equipment  Prior Functional Level  Prior Function  Level of Independence: Independent  Current Functional Level  Cognition  Overall Cognitive Status: Within Functional Limits for tasks assessed  Orientation Level: Oriented X4   Extremity Assessment  (includes Sensation/Coordination)  Upper Extremity Assessment: Defer to OT evaluation  Lower Extremity Assessment: RLE deficits/detail;LLE deficits/detail  RLE Deficits / Details: bil leg weakness in standing (flexed and shaking knee posture).  LLE Deficits / Details: bil leg weakness in standing (flexed and shaking knee posture).  Cervical / Trunk Assessment: Normal   ADLs  Overall ADL's : Needs assistance/impaired  Eating/Feeding: Modified independent;Sitting  Grooming: Moderate assistance;Standing  Grooming Details (indicate cue type and reason): cues for  upright posture  Lower Body Bathing: Maximal assistance;Sit to/from stand  Upper Body Dressing : Moderate assistance;Sitting  Lower Body Dressing: Maximal assistance;Sit to/from stand;With adaptive equipment  Toilet Transfer: Moderate assistance;Ambulation;RW;BSC  Toileting- Clothing Manipulation and Hygiene: Minimal assistance;Sit to/from stand  Toileting - Clothing Manipulation Details (indicate cue type and reason): min (A) for peri care  Functional mobility during ADLs: Minimal assistance;Rolling walker  General ADL Comments: Pt completed chair transfer with good hand placement. Pt required constant cues throughout session for posture. Pt with fatigue with more flexed posture. Pt completed toilet transfer, hand hygiene and oral care. Pt very brisk with sink level task due to incr pain and decr posture   Mobility  Overal bed mobility: (pt up in chair upon PT arrival)  Bed Mobility: Sit to Sidelying  Rolling: Supervision  Sidelying to sit: Max assist  Sit to sidelying: Min assist  General bed mobility comments: in chair on arrival   Transfers  Overall transfer level: Needs assistance  Equipment used: Rolling walker (2 wheeled)  Transfers: Sit to/from Stand  Sit to Stand: Min assist  Stand pivot transfers: +2 physical assistance;Max assist  General transfer comment: cues for posture and reaching back to chair with descending to chair   Ambulation / Gait / Stairs / Wheelchair Mobility  Ambulation/Gait  Ambulation/Gait assistance: Occupational psychologist (Feet): 20 Feet  Assistive device: Rolling walker (2 wheeled)  Gait Pattern/deviations: Shuffle;Trunk flexed  Gait velocity: decreased  Gait velocity interpretation: <1.8 ft/sec, indicative of risk for recurrent falls  General Gait Details: Pt reports continued left leg pain and weakness with gait. As she fatigues bil knees become flexed. She has a hard time turning the RW as she is WB heavily through her arms at all times during  gait.   Posture / Balance  Overall balance assessment: Needs assistance  Sitting-balance support: Feet supported;No upper extremity supported  Sitting balance-Leahy Scale: Good  Standing balance support: No upper extremity supported  Standing balance-Leahy Scale: Poor  Standing balance comment: Needs mod assist for dynamic movements and cannot stand statically without support.   Special needs/care consideration  BiPAP/CPAP No  CPM No  Continuous Drip IV No  Dialysis No  Life Vest No  Oxygen No  Special Bed No  Trach Size No  Wound Vac (area) No  Skin Has sore area on buttocks. Has dry skin. Has a lumbar corset.  Bowel mgmt: No BM since last Sunday, 08/24/14  Bladder mgmt: Voiding in bathroom with assistacne  Diabetic mgmt  Yes, on insulin at home.   Previous Home Environment  Living Arrangements: Children  Available Help at Discharge: Family;Available 24 hours/day  Type of Home: House  Home Layout: One level  Home Access: Stairs to enter  Entrance Stairs-Rails: Water quality scientist of Steps: 4  Bathroom Shower/Tub: Tub/shower unit  Home Care Services: Yes  Type of Home Care Services: Homehealth aide  Discharge Living Setting  Plans for Discharge Living Setting: House;Lives with (comment) (Lives with daughter, Jasmine December.)  Type of Home at Discharge: House  Discharge Home Layout: One level  Discharge Home Access: Stairs to enter  Entrance Stairs-Number of Steps: 4 steps at front entrance  Does the patient have any problems obtaining your medications?: No  Social/Family/Support Systems  Patient Roles: Parent (Has several daughters and a son.)  Contact Information: Sterling Big - daughter  Anticipated Caregiver: Daughter  Anticipated Caregiver's Contact Information: Jasmine December - dtr [phone]  Ability/Limitations of Caregiver: Dtr does not work and can assist. Lives with patient.  Caregiver Availability: 24/7  Discharge Plan Discussed with Primary Caregiver: Yes  Is  Caregiver In Agreement with Plan?: Yes  Does Caregiver/Family have Issues with Lodging/Transportation while Pt is in Rehab?: No  Goals/Additional Needs  Patient/Family Goal for Rehab: PT/OT mod I goals  Expected length of stay: 7 days  Cultural Considerations: Christian  Dietary Needs: Carb mod med cal, thin liquids  Equipment Needs: TBD  Pt/Family Agrees to Admission and willing to participate: Yes  Program Orientation Provided & Reviewed with Pt/Caregiver Including Roles & Responsibilities: Yes  Decrease burden of Care through IP rehab admission: N/A  Possible need for SNF placement upon discharge: Not anticipated  Patient Condition: This patient's medical and functional status has changed since the consult dated: 08/28/14 in which the Rehabilitation Physician determined and documented that the patient's condition is appropriate for intensive rehabilitative care in an inpatient rehabilitation facility. See "History of Present Illness" (above) for medical update. Functional changes are: Currently requiring min assist to ambulate 20 ft RW. Patient's medical and functional status update has been discussed with the Rehabilitation physician and patient remains appropriate for inpatient rehabilitation. Will admit to inpatient rehab today.  Preadmission Screen Completed By: Trish Mage, 08/31/2014 12:01 PM  ______________________________________________________________________  Discussed status with Dr. Riley Kill on 08/31/14 at 1201 and received telephone approval for admission today.  Admission Coordinator: Trish Mage, time1201/Date09/07/15  Cosigned by: Ranelle Oyster, MD [08/31/2014 12:35 PM]

## 2014-09-01 NOTE — Progress Notes (Signed)
[place] PH[facility]ICAL MEDICINE & REHABILITATION     PROGRESS NOTE    Subjective/Complaints: Had bm last night after dig stim. Hard.  Pain an issue A little groggy this morning.  Objective: Vital Signs: Blood pressure 138/53, pulse 67, temperature 98 F (36.7 C), temperature source Oral, resp. rate 18, height  (1.753 m), weight 74.571 kg (164 lb 6.4 oz), SpO2 99.00%. No results found.  Recent Labs  09/01/14 0555  WBC 10.7*  HGB 9.8*  HCT 29.1*  PLT 284    Recent Labs  09/01/14 0555  NA 137  K 4.1  CL 100  GLUCOSE 115*  BUN 24*  CREATININE 0.98  CALCIUM 8.7   CBG (last 3)   Recent Labs  08/31/14 1600 08/31/14 2130 09/01/14 0657  GLUCAP 196* 186* 135*    Wt Readings from Last 3 Encounters:  08/31/14 74.571 kg (164 lb 6.4 oz)  08/25/14 86.4 kg (190 lb 7.6 oz)  08/25/14 86.4 kg (190 lb 7.6 oz)    Physical Exam:  Nursing note and vitals reviewed.  Constitutional: She is oriented to person, place, and time. She appears well-developed and well-nourished.  HENT: oral mucosa pink and moist  Head: Normocephalic and atraumatic.  Eyes: Conjunctivae are normal. Pupils are equal, round, and reactive to light.  Neck: Normal range of motion. Neck supple.  Cardiovascular: Normal rate and regular rhythm. No murmurs or rubs.  Respiratory: Effort normal and breath sounds normal. No respiratory distress. She has no wheezes. No rales  GI: Soft. Bowel sounds are normal. She exhibits no distension. There is no tenderness.  Musculoskeletal: She exhibits no edema.  Back pain with SLR BLE. Left hip pain with ROM at hip.  Neurological: She is alert and oriented to person, place, and time. No cranial nerve deficit. Coordination normal.  Good sitting balance. Still some weakness (4-) in left quads, hip adductors. HF are 4- as well (pain). ADF/APF 5/5. UE's grossly 5/5 prox to distal. Pain in L3 distribution, left > right legs. No focal sensory loss is appreciated however except  for mild stocking glove sensory loss in the feet (to ankles). Cognitively displays normal insight, awareness, memory.  Skin: Skin is warm and dry.  Psychiatric: She has a normal mood and affect. Her behavior is normal. Thought content normal      Assessment/Plan: 1. Functional deficits secondary to Left L3 lumbar radiculopathy/stenosis s/p L3-4 laminectomy and fusion which require 3+ hours per day of interdisciplinary therapy in a comprehensive inpatient rehab setting. Physiatrist is providing close team supervision and 24 hour management of active medical problems listed below. Physiatrist and rehab team continue to assess barriers to discharge/monitor patient progress toward functional and medical goals. FIM:                   Comprehension Comprehension Mode: Auditory Comprehension: 5-Understands complex 90% of the time/Cues < 10% of the time  Expression Expression Mode: Verbal Expression: 5-Expresses complex 90% of the time/cues < 10% of the time  Social Interaction Social Interaction: 5-Interacts appropriately 90% of the time - Needs monitoring or encouragement for participation or interaction.  Problem Solving Problem Solving: 5-Solves complex 90% of the time/cues < 10% of the time  Memory Memory: 5-Recognizes or recalls 90% of the time/requires cueing < 10% of the time Medical Problem List and Plan:  1. Functional deficits secondary to Left Lumbar 3 radiculopathy/stenosis s/p L3-4 laminectomy and fusion  2. DVT Prophylaxis/Anticoagulation: Mechanical: Sequential compression devices, below knee Bilateral lower extremities  3. Pain Management: now on oxycodone prn for pain management.   -gabapentin for radicular pain  -consider long acting agent potentially depending upon pattern 4. Mood: Team to provide ego support. LCSW to follow for evaluation and support.  5. Neuropsych: This patient is capable of making decisions on her own behalf.  6. Skin/Wound Care:   -  Routine pressure relief measures.   -nutrition 7. DM type 2:  . Continue lantus 10 units and and use SSI for elevated BS.   -follow for pattern 8. UTI: continuekelfex empirically. UCS still pending.  9. HTN: Monitor BP every 8 hours. Continue norvasc, Lisinopril and tenormin  10. Constipation: augment bowel regimen 11. FEN: encourage fluids  LOS (Days) 1 A FACE TO FACE EVALUATION WAS PERFORMED  Rachell Druckenmiller T 09/01/2014 8:03 AM

## 2014-09-01 NOTE — Care Management Note (Signed)
[facility]patient [facility] Individual Statement of Services  Patient Name:  ARPI DIEBOLD  Date:  09/01/2014  Welcome to the [facility]patient [facility].  Our goal is to provide you with an individualized program based on your diagnosis and situation, designed to meet your specific needs.  With this comprehensive rehabilitation program, you will be expected to participate in at least 3 hours of rehabilitation therapies Monday-Friday, with modified therapy programming on the weekends.  Your rehabilitation program will include the following services:  Physical Therapy (PT), Occupational Therapy (OT), 24 hour per day rehabilitation nursing, Therapeutic Recreaction (TR), Case Management (Social Worker), Rehabilitation Medicine, Nutrition Services and Pharmacy Services  Weekly team conferences will be held on Tuesdays to discuss your progress.  Your Social Worker will talk with you frequently to get your input and to update you on team discussions.  Team conferences with you and your family in attendance may also be held.  Expected length of stay: 7 days  Overall anticipated outcome: supervision  Depending on your progress and recovery, your program may change. Your Social Worker will coordinate services and will keep you informed of any changes. Your Social Worker's name and contact numbers are listed  below.  The following services may also be recommended but are not provided by the Inpatient Rehabilitation Center:   Driving Evaluations  Home Health Rehabiltiation Services  Outpatient Rehabilitation Services  Vocational Rehabilitation   Arrangements will be made to provide these services after discharge if needed.  Arrangements include referral to agencies that provide these services.  Your insurance has been verified to be:  Worker's Comp Your primary doctor is:  Dr. Leandrew Koyanagi  Pertinent information will be shared with your doctor and your insurance company.  Social Worker:   [place], [place] [phone] or ([phone]   Information discussed with and copy given to patient by: Amada Jupiter, 09/01/2014, 10:21 PM

## 2014-09-01 NOTE — Evaluation (Signed)
Occupational Therapy Assessment and Plan  Patient Details  Name: April [name] MRN: [id number] Date of Birth: February 05, 1925  OT Diagnosis: muscle weakness (generalized) and pain in joint Rehab Potential: Rehab Potential: Good ELOS: 7 days   Today's Date: 09/01/2014 OT Individual Time: 1027-2536 OT Individual Time Calculation (min): 60 min     Problem List:  Patient Active Problem List   Diagnosis Date Noted  . Left lumbar radiculopathy 08/31/2014  . Spinal stenosis of lumbar region 08/24/2014  . Hip fracture 04/10/2014  . Fracture of femoral neck, left 04/08/2014  . Left displaced femoral neck fracture 04/07/2014  . HTN (hypertension) 04/07/2014  . Hyperlipidemia 04/07/2014  . DM (diabetes mellitus) 04/07/2014  . Hypothyroidism 04/07/2014  . OA (osteoarthritis) 04/07/2014  . UTI (lower urinary tract infection) 04/07/2014  . Dehydration 04/07/2014    Past Medical History:  Past Medical History  Diagnosis Date  . Hyperlipidemia 04/07/2014    takes Lovastatin daily  . OA (osteoarthritis) 04/07/2014  . Hypertension     takes Amlodipine and Atenolol daily  . HTN (hypertension) 04/07/2014  . Diabetes mellitus without complication     takes Lantus daily  . Hypothyroidism 04/07/2014    takes Synthroid daily  . History of bronchitis 20 yrs ago  . History of lupus 78yr ago  . Joint pain   . Peripheral edema   . UTI (lower urinary tract infection)     completed antibioitcs on 07/28/14  . Urinary frequency     takes Vesicare daily  . Chronic back pain     stenosis  . Constipation     takes an OTC stool softener   . GERD (gastroesophageal reflux disease)     not on Protonix daily  . Nocturia   . Cataract     left eye  . History of shingles    Past Surgical History:  Past Surgical History  Procedure Laterality Date  . Cholecystectomy    . Hip arthroplasty Left 04/08/2014    Procedure: LEFT HIP HEMIARTHROPLASTY;  Surgeon: SAugustin Schooling MD;  [facility]  Service:  Orthopedics;  Laterality: Left;  . Tonsillectomy    . Adenoidectomy    . Left cataract removed    . Eye surgery      Assessment & Plan Clinical Impression: Patient is a 80y.o. year old female with h/o HTN, DM type 2, fall at work 04/07/14 with hip fracture--CIR stay with progress to supervision level. She has had progressive worsening back and bilateral leg pain rating down to the front of her shin adjacent of her foot consistent with predominately and L4 nerve root pattern. Workup revealed severe spinal stenosis with virtually complete CSF block at L3-4 with a virtually autofused L5-S1 and patient elected to undergo decompressive lam L3/4 with fusion and repair of inadvertent dural tear by Dr. CSaintclair Halstedon 08/24/14. Post op on bedrest till 08/26/14. She has had problems with urinary retention. LE weakness as well as pain affecting mobiliy. She has had hypoglycemic episodes and lantus decrease to 10 units. UA done due to frequency and shows evidence of bacteria.  Patient transferred to CIR on 08/31/2014 .    Patient currently requires mod assist with basic self-care skills secondary to muscle weakness and muscle joint tightness.  Prior to hospitalization, patient could complete BADL with min.  Patient will benefit from skilled intervention to increase independence with basic self-care skills prior to discharge home with care partner.  Anticipate patient will require minimal physical assistance and follow  up home health. OT - End of Session Activity Tolerance: Tolerates 30+ min activity with multiple rests Endurance Deficit: Yes (Simultaneous filing. User may not have seen previous data.) Endurance Deficit Description: Reports significant fatigue in BUE/BLE after 30' ambulation OT Assessment Rehab Potential: Good OT Patient demonstrates impairments in the following area(s): Endurance;Pain;Safety OT Basic ADL's Functional Problem(s): Bathing;Dressing;Toileting OT Transfers Functional Problem(s):  Toilet;Tub/Shower OT Plan OT Intensity: Minimum of 1-2 x/day, 45 to 90 minutes OT Frequency: 5 out of 7 days OT Duration/Estimated Length of Stay: 7 days OT Treatment/Interventions: Therapeutic Activities;Self Care/advanced ADL retraining;Therapeutic Exercise;Pain management;Functional mobility training;DME/adaptive equipment instruction;UE/LE Strength taining/ROM;Patient/family education OT Self Feeding Anticipated Outcome(s): Independent OT Basic Self-Care Anticipated Outcome(s): [place] Min Assist OT Toileting Anticipated Outcome(s): Supervision OT Bathroom Transfers Anticipated Outcome(s): Supervision OT Recommendation Patient destination: Home Follow Up Recommendations: Home health OT Equipment Recommended: To be determined   Skilled Therapeutic Intervention OT 1:1 eval completed with intervention provided to address lower body bathing/dressing skills, adherence to back precautions, functional mobility using RW, transfer safety, and general endurance.   Pt completed functional mobility using RW from bed to shower chair with min assist during mobility but required max assist for lower body B & D d/t pain and limited reach.    Pt with good safety awareness throughout session with adherence to back precautions with only verbal cues needed to locate grab bars.  OT Evaluation Precautions/Restrictions  Precautions Precautions: Back;Fall Precaution Booklet Issued: Yes (comment) Precaution Comments: pt able to report 3/3 back precautions.   Required Braces or Orthoses: Spinal Brace Spinal Brace: Applied in sitting position;Lumbar corset Restrictions Weight Bearing Restrictions: No General Chart Reviewed: Yes Family/Caregiver Present: No Vital Signs Therapy Vitals BP: 136/60 mmHg Pain Pain Assessment Pain Assessment: 0-10 Pain Score: 7  Pain Type: Acute pain Pain Location: Abdomen Pain Orientation: Mid Pain Descriptors / Indicators: Aching Pain Frequency: Intermittent Pain  Onset: With Activity Pain Intervention(s): Medication (See eMAR);Repositioned, shower  Home Living/Prior Functioning Home Living Available Help at Discharge: Family;Available 24 hours/day Type of Home: House Home Access: Stairs to enter [employer] of Steps: 4 Entrance Stairs-Rails: Right;Left Home Layout: One level Additional Comments: Has RW, wheelchair at home  Lives With: Daughter IADL History Homemaking Responsibilities: No Current License: No Prior Function Level of Independence: Requires assistive device for independence;Needs assistance with ADLs Bath: Minimal Dressing: Minimal  Able to Take Stairs?: Yes Driving: No Vocation: Retired Comments: plays piano, reads, art ADL ADL ADL Comments: see FIM Vision/Perception  Vision- History Baseline Vision/History: Wears glasses Wears Glasses: Reading only;Distance only Patient Visual Report: Blurring of vision Vision- Assessment Vision Assessment?: No apparent visual deficits Perception Comments: WFL  Cognition Overall Cognitive Status: Within Functional Limits for tasks assessed Arousal/Alertness: Awake/alert Orientation Level: Oriented X4 Attention: Selective Selective Attention: Appears intact Memory: Appears intact Awareness: Appears intact Problem Solving: Appears intact Safety/Judgment: Appears intact Sensation Sensation Light Touch: Appears Intact Stereognosis: Appears Intact Hot/Cold: Appears Intact Proprioception: Appears Intact Additional Comments: @ BUE Coordination Gross Motor Movements are Fluid and Coordinated: Yes Fine Motor Movements are Fluid and Coordinated: Yes Motor  Motor Motor: Within Functional Limits Mobility  Bed Mobility Bed Mobility: Rolling Right;Rolling Left;Supine to Sit;Sit to Supine Rolling Right: 4: Min assist Rolling Right Details: Verbal cues for technique;Verbal cues for precautions/safety;Tactile cues for sequencing Rolling Left: 4: Min assist Rolling Left  Details: Verbal cues for technique;Verbal cues for precautions/safety;Tactile cues for sequencing Supine to Sit: 4: Min assist Supine to Sit Details: Tactile cues for sequencing;Verbal cues for precautions/safety;Verbal cues  for technique Sit to Supine: 4: Min assist Sit to Supine - Details: Tactile cues for sequencing;Verbal cues for precautions/safety;Verbal cues for technique Transfers Transfers: Sit to Stand;Stand to Sit Sit to Stand: 4: Min assist Sit to Stand Details: Tactile cues for posture;Verbal cues for precautions/safety Stand to Sit: 4: Min guard;Other (comment) (to shower chair)  Trunk/Postural Assessment  Cervical Assessment Cervical Assessment: Within Functional Limits Thoracic Assessment Thoracic Assessment: Within Functional Limits Lumbar Assessment Lumbar Assessment: Exceptions to [facility] Postural Control Postural Control: Within Functional Limits  Balance Standardized Balance Assessment Standardized Balance Assessment: Timed Up and Go Test Timed Up and Go Test TUG: Normal TUG Normal TUG (seconds): 63 Extremity/Trunk Assessment RUE Assessment RUE Assessment: Exceptions to [facility] RUE Strength RUE Overall Strength Comments: Overall 4/5 LUE Assessment LUE Assessment: Exceptions to [facility] LUE Strength LUE Overall Strength Comments: Overall 3+/5  FIM:  FIM - Eating Eating Activity: 7: Complete independence:no helper FIM - Grooming Grooming Steps: Wash, rinse, dry face;Wash, rinse, dry hands;Brush, comb hair Grooming: 4: Steadying assist  or patient completes 3 of 4 or 4 of 5 steps FIM - Bathing Bathing Steps Patient Completed: Chest;Right Arm;Left Arm;Abdomen;Front perineal area;Right upper leg;Left upper leg Bathing: 3: Mod-Patient completes 5-7 [id number] parts or 50-74% FIM - Upper Body Dressing/Undressing Upper body dressing/undressing steps patient completed: Thread/unthread right sleeve of pullover shirt/dresss;Thread/unthread left sleeve of pullover shirt/dress;Put  head through opening of pull over shirt/dress Upper body dressing/undressing: 4: Min-Patient completed 75 plus % of tasks FIM - Lower Body Dressing/Undressing Lower body dressing/undressing: 1: Total-Patient completed less than 25% of tasks FIM - BControl and instrumentation engineerDevices: Walker;Arm rests;Bed rails Bed/Chair Transfer: 4: Supine > Sit: Min A (steadying Pt. > 75%/lift 1 leg);4: Sit > Supine: Min A (steadying pt. > 75%/lift 1 leg);4: Bed > Chair or W/C: Min A (steadying Pt. > 75%);4: Chair or W/C > Bed: Min A (steadying Pt. > 75%) FIM - TSystems developerDevices: WEnvironmental consultantWalk in shower;Grab bars;Shower chair;Orthosis (lumbar corsett) Tub/shower Transfers: 4-Into Tub/Shower: Min A (steadying Pt. > 75%/lift 1 leg);4-Out of Tub/Shower: Min A (steadying Pt. > 75%/lift 1 leg)   Refer to Care Plan for Long Term Goals  Recommendations for other services: None  Discharge Criteria: Patient will be discharged from OT if patient refuses treatment 3 consecutive times without medical reason, if treatment goals not met, if there is a change in medical status, if patient makes no progress towards goals or if patient is discharged from hospital.  The above assessment, treatment plan, treatment alternatives and goals were discussed and mutually agreed upon: by patient  [facility]9/07/2014, 4:06 PM

## 2014-09-01 NOTE — Progress Notes (Signed)
Social Work Social Work Assessment and Plan  Patient Details  Name: April [name] MRN: [id number] Date of Birth: 1924-12-26  Today's Date: 09/01/2014  Problem List:  Patient Active Problem List   Diagnosis Date Noted  . Left lumbar radiculopathy 08/31/2014  . Spinal stenosis of lumbar region 08/24/2014  . Hip fracture 04/10/2014  . Fracture of femoral neck, left 04/08/2014  . Left displaced femoral neck fracture 04/07/2014  . HTN (hypertension) 04/07/2014  . Hyperlipidemia 04/07/2014  . DM (diabetes mellitus) 04/07/2014  . Hypothyroidism 04/07/2014  . OA (osteoarthritis) 04/07/2014  . UTI (lower urinary tract infection) 04/07/2014  . Dehydration 04/07/2014   Past Medical History:  Past Medical History  Diagnosis Date  . Hyperlipidemia 04/07/2014    takes Lovastatin daily  . OA (osteoarthritis) 04/07/2014  . Hypertension     takes Amlodipine and Atenolol daily  . HTN (hypertension) 04/07/2014  . Diabetes mellitus without complication     takes Lantus daily  . Hypothyroidism 04/07/2014    takes Synthroid daily  . History of bronchitis 20 yrs ago  . History of lupus 108yrs ago  . Joint pain   . Peripheral edema   . UTI (lower urinary tract infection)     completed antibioitcs on 07/28/14  . Urinary frequency     takes Vesicare daily  . Chronic back pain     stenosis  . Constipation     takes an OTC stool softener   . GERD (gastroesophageal reflux disease)     not on Protonix daily  . Nocturia   . Cataract     left eye  . History of shingles    Past Surgical History:  Past Surgical History  Procedure Laterality Date  . Cholecystectomy    . Hip arthroplasty Left 04/08/2014    Procedure: LEFT HIP HEMIARTHROPLASTY;  Surgeon: Verlee Rossetti, MD;  Location: [facility] OR;  Service: Orthopedics;  Laterality: Left;  . Tonsillectomy    . Adenoidectomy    . Left cataract removed    . Eye surgery     Social History:  reports that she has never smoked. She has never used  smokeless tobacco. She reports that she does not drink alcohol or use illicit drugs.  Family / Support Systems Marital Status: Widow/Widower Patient Roles: Parent Children: daughter, April [name] @ [phone] (lives with patient);  daughter, April [name] @ (H) [phone] or (C) [phone];  son, April [name] @ [phone] Anticipated Caregiver: daugher, April [name] Ability/Limitations of Caregiver: Dtr does not work and can assist.  Lives with patient. 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  Admits she is frustrated by bowel and pain issues, however, denies any s/s of depression or anxiety - will monitor. Recent Psychosocial Issues: Fall at work in April and underwent surgery with CIR stay then Pyschiatric History: None  Substance Abuse History: None  Patient / Family Perceptions, Expectations & Goals Pt/Family understanding of illness & functional limitations: Pt and family with good, basic understanding of her further injuries that are relatied to her fall in April and of need for surgery.  Will good understanding of current functional limitations/ need for CIR. Premorbid pt/family roles/activities: Pt  left CIR after initial injury at a supervision level and ambulating at a supervision level.  Pt and daughter both note that she began declining approx 3 weeks post d/c and having increasing pain.  Daughter has been primary careigiver since original injury. Anticipated changes in roles/activities/participation: little change anticipated if pt reaches projected supervision goals Pt/family expectations/goals: Pt and family hope to make good gains and reach a supervision level.  Community Resources [employer]: Other (Comment) (Worker's Comp CM) Premorbid Home Care/DME Agencies: Other (Comment) Patent attorney) Transportation available at discharge: yes  Discharge Planning Living Arrangements: Children Support Systems: Children Type of Residence: Private residence Insurance Resources: Media planner (specify) (Worker's Comp) Surveyor, quantity Resources: Tree surgeon;Other (Comment) (Worker's Comp) Financial Screen Referred: No Living Expenses: Own Money Management: Family Does the patient have any problems obtaining your medications?: No Home Management: pt and family Patient/Family Preliminary Plans: Pt plans to return to her home with family providing 24/7 support Social Work Anticipated Follow Up Needs: HH/OP Expected length of stay: 7 days  Clinical Impression Unfortunate woman here due to injuries suffered in April at her work.  Now undergoing another surgery and on CIR for strengthening overall.  Excellent family support.  Worker's Comp case.  No emotional distress beyond frustration with circumstances - will monitor.  To follow for support and d/c planning needs.   April [name] 09/01/2014, 10:19 PM

## 2014-09-01 NOTE — Progress Notes (Signed)
Patient information reviewed and entered into eRehab system by Khylin Gutridge, RN, CRRN, PPS Coordinator.  Information including medical coding and functional independence measure will be reviewed and updated through discharge.     Per nursing patient was given "Data Collection Information Summary for Patients in Inpatient Rehabilitation Facilities with attached "Privacy Act Statement-Health Care Records" upon admission.  

## 2014-09-01 NOTE — Progress Notes (Signed)
Patient had medium size hard stool post digital stimulation. Provided patient with more juice. Will continue to monitor. Diamantina Monks, RN

## 2014-09-01 NOTE — Evaluation (Signed)
Physical Therapy Assessment and Plan  Patient Details  Name: April [name] MRN: [id number] Date of Birth: 1925-04-24  PT Diagnosis: Abnormality of gait, Difficulty walking, Low back pain and Muscle weakness Rehab Potential: Good ELOS: 7 days   Today's Date: 09/01/2014 PT Individual Time: 3532-9924 PT Individual Time Calculation (min): 60 min    Problem List:  Patient Active Problem List   Diagnosis Date Noted  . Left lumbar radiculopathy 08/31/2014  . Spinal stenosis of lumbar region 08/24/2014  . Hip fracture 04/10/2014  . Fracture of femoral neck, left 04/08/2014  . Left displaced femoral neck fracture 04/07/2014  . HTN (hypertension) 04/07/2014  . Hyperlipidemia 04/07/2014  . DM (diabetes mellitus) 04/07/2014  . Hypothyroidism 04/07/2014  . OA (osteoarthritis) 04/07/2014  . UTI (lower urinary tract infection) 04/07/2014  . Dehydration 04/07/2014    Past Medical History:  Past Medical History  Diagnosis Date  . Hyperlipidemia 04/07/2014    takes Lovastatin daily  . OA (osteoarthritis) 04/07/2014  . Hypertension     takes Amlodipine and Atenolol daily  . HTN (hypertension) 04/07/2014  . Diabetes mellitus without complication     takes Lantus daily  . Hypothyroidism 04/07/2014    takes Synthroid daily  . History of bronchitis 20 yrs ago  . History of lupus 89yr ago  . Joint pain   . Peripheral edema   . UTI (lower urinary tract infection)     completed antibioitcs on 07/28/14  . Urinary frequency     takes Vesicare daily  . Chronic back pain     stenosis  . Constipation     takes an OTC stool softener   . GERD (gastroesophageal reflux disease)     not on Protonix daily  . Nocturia   . Cataract     left eye  . History of shingles    Past Surgical History:  Past Surgical History  Procedure Laterality Date  . Cholecystectomy    . Hip arthroplasty Left 04/08/2014    Procedure: LEFT HIP HEMIARTHROPLASTY;  Surgeon: SAugustin Schooling MD;  [facility]   Service: Orthopedics;  Laterality: Left;  . Tonsillectomy    . Adenoidectomy    . Left cataract removed    . Eye surgery      Assessment & Plan Clinical Impression: April ZITOis a 80y.o. female with h/o HTN, DM type 2, fall at work 03/2104 with hip fracture--CIR stay with progress to supervision level. She has had progressive worsening back and bilateral leg pain rating down to the front of her shin adjacent of her foot consistent with predominately and L4 nerve root pattern. Workup revealed severe spinal stenosis with virtually complete CSF block at L3-4 with a virtually autofused L5-S1 and patient elected to undergo decompressive lam L3/4 with fusion and repair of inadvertent dural tear by Dr. CSaintclair Halstedon 08/24/14. Post op on bedrest till 08/26/14. She has problems with urinary retention. LE weakness as well as pain affecting mobiliy. She continues to require IV dilaudid for pain management. She has had hypoglycemic episodes and lantus decrease to 10 units. UA done due to frequency and shows evidence of pyuria.    Patient currently requires min with mobility secondary to muscle weakness, decreased cardiorespiratoy endurance and decreased standing balance, decreased postural control and difficulty maintaining precautions.  Prior to hospitalization, patient was modified independent  with mobility and lived with Daughter in a House home.  Home access is 4Stairs to enter.  Patient will benefit from skilled PT  intervention to maximize safe functional mobility, minimize fall risk and decrease caregiver burden for planned discharge home with intermittent assist.  Anticipate patient will benefit from follow up [facility] at discharge.     Skilled Therapeutic Intervention Session 1: Session focused on functional evaluation, gait training. Pt oriented to rehab unit, safety in room, role of PT in rehab. Gait training 30' w/ RW and MinA for safety. Ambulated 10' w/ RW and up/down 3 5 inch stairs w/ MinA w/ 2  rails, then up/down 3 5 inch stairs and 2 7 inch stairs w/ MinA. Pt primarily limited by decreased endurance, decreased LE strength, decreased standing balance. Pt propelled w/c w/ mod (I) and required (S) to manage w/c parts. Pt left seated in w/c w/ all needs within reach.  Session 2: Pt received seated in w/c, agreeable to participate in therapy. Pt propelled w/c 150' to rehab gym w/ S and BUE. Pt ambulated 69' w/ RW and MinGuard A. Pt performed Timed Up and Go Test w/ result of 63 seconds, indicating high falls risk. Pt also performed 30 second Sit to Stand test completing 4 sit<>stands. Worked on bed mobility, pt rolled L/R w/ log roll technique and MinA for LE management. Moved supine<>sit w ModA w/ assist for managing BLE and moving trunk to sit while maintaining precautions. Pt propelled w/c back to room, left seated in w/c w/ all needs within reach. Discussed goals and likely length of stay with pt and daughters, oriented them to team conference procedure. All agreeable to plan.   PT Evaluation Precautions/Restrictions Precautions Precautions: Back;Fall Precaution Booklet Issued: Yes (comment) Precaution Comments: pt able to report 3/3 back precautions.   Required Braces or Orthoses: Spinal Brace Spinal Brace: Applied in sitting position;Lumbar corset Restrictions Weight Bearing Restrictions: No General Chart Reviewed: Yes Vital SignsTherapy Vitals BP: 136/60 mmHg Pain Pain Assessment Pain Assessment: 0-10 Pain Score: 4  Pain Type: Acute pain Pain Location: Back Pain Orientation: Mid Pain Descriptors / Indicators: Aching Pain Onset: With Activity Patients Stated Pain Goal: 0 Pain Intervention(s): Shower;Repositioned Multiple Pain Sites: No Home Living/Prior Functioning Home Living Available Help at Discharge: Family;Available 24 hours/day Type of Home: House Home Access: Stairs to enter [employer] of Steps: 4 Entrance Stairs-Rails: Right;Left Home Layout: One  level Additional Comments: Has RW, wheelchair at home  Lives With: Daughter Prior Function Level of Independence: Requires assistive device for independence;Needs assistance with ADLs Bath: Minimal Dressing: Minimal  Able to Take Stairs?: Yes Driving: No Vocation: Retired Comments: plays piano, reads, Marine scientist Comments: WFL  Cognition Overall Cognitive Status: Within Functional Limits for tasks assessed Arousal/Alertness: Awake/alert Orientation Level: Oriented X4 Attention: Selective Selective Attention: Appears intact Memory: Appears intact Awareness: Appears intact Problem Solving: Appears intact Safety/Judgment: Appears intact Sensation Sensation Light Touch: Appears Intact Stereognosis: Appears Intact Hot/Cold: Appears Intact Proprioception: Appears Intact Additional Comments: @ BUE Coordination Gross Motor Movements are Fluid and Coordinated: Yes Fine Motor Movements are Fluid and Coordinated: Yes Motor  Motor Motor: Within Functional Limits  Mobility Bed Mobility Bed Mobility: Rolling Right;Rolling Left;Supine to Sit;Sit to Supine Rolling Right: 4: Min assist Rolling Right Details: Verbal cues for technique;Verbal cues for precautions/safety;Tactile cues for sequencing Rolling Left: 4: Min assist Rolling Left Details: Verbal cues for technique;Verbal cues for precautions/safety;Tactile cues for sequencing Supine to Sit: 4: Min assist Supine to Sit Details: Tactile cues for sequencing;Verbal cues for precautions/safety;Verbal cues for technique Sit to Supine: 4: Min assist Sit to Supine - Details: Tactile cues for sequencing;Verbal  cues for precautions/safety;Verbal cues for technique Transfers Transfers: Yes Stand Pivot Transfers: 4: Min assist Stand Pivot Transfer Details: Verbal cues for technique;Verbal cues for precautions/safety;Tactile cues for sequencing Locomotion  Ambulation Ambulation: Yes Ambulation/Gait Assistance: 4:  Min assist Ambulation Distance (Feet): 30 Feet Assistive device: Rolling walker Ambulation/Gait Assistance Details: Assist for safety Gait Gait: Yes Gait Pattern: Decreased step length - right Gait velocity: decreased Stairs / Additional Locomotion Stairs: Yes Stairs Assistance: 4: Min assist Stairs Assistance Details: Tactile cues for sequencing;Verbal cues for precautions/safety;Verbal cues for technique Stair Management Technique: Two rails Number of Stairs: 5 Wheelchair Mobility Wheelchair Mobility: Yes Wheelchair Assistance: 6: Modified independent (Device/Increase time) Environmental health practitioner: Both upper extremities Wheelchair Parts Management: Supervision/cueing Distance: 150  Trunk/Postural Assessment  Cervical Assessment Cervical Assessment: Within Functional Limits Thoracic Assessment Thoracic Assessment: Within Functional Limits Lumbar Assessment Lumbar Assessment: Exceptions to [facility] (Lumbar corset on) Postural Control Postural Control:  (NT due to precautions)  Balance   Extremity Assessment  RUE Assessment RUE Assessment: Exceptions to [facility] RUE Strength RUE Overall Strength Comments: Overall 4/5 LUE Assessment LUE Assessment: Exceptions to [facility] LUE Strength LUE Overall Strength Comments: Overall 3+/5 RLE Assessment RLE Assessment: Exceptions to [facility] RLE Strength RLE Overall Strength Comments: Overall 4+/5 LLE Assessment LLE Assessment: Exceptions to [facility] LLE Strength LLE Overall Strength Comments: Overall 4/5  FIM:  FIM - Locomotion: Wheelchair Distance: 150 FIM - Locomotion: Ambulation Ambulation/Gait Assistance: 4: Min assist   Refer to Care Plan for Long Term Goals  Recommendations for other services: None  Discharge Criteria: Patient will be discharged from PT if patient refuses treatment 3 consecutive times without medical reason, if treatment goals not met, if there is a change in medical status, if patient makes no progress towards goals or if  patient is discharged from hospital.  The above assessment, treatment plan, treatment alternatives and goals were discussed and mutually agreed upon: by patient  Rada Hay 09/01/2014, 11:56 AM

## 2014-09-01 NOTE — Progress Notes (Signed)
Patient remains unable to move bowels, provided 120cc of warm prune juice and suppository.  Diamantina Monks, RN

## 2014-09-02 ENCOUNTER — Inpatient Hospital Stay (HOSPITAL_COMMUNITY): Payer: Worker's Compensation | Admitting: Occupational Therapy

## 2014-09-02 ENCOUNTER — Inpatient Hospital Stay (HOSPITAL_COMMUNITY): Payer: Worker's Compensation | Admitting: Physical Therapy

## 2014-09-02 ENCOUNTER — Inpatient Hospital Stay (HOSPITAL_COMMUNITY): Payer: Self-pay | Admitting: *Deleted

## 2014-09-02 ENCOUNTER — Inpatient Hospital Stay (HOSPITAL_COMMUNITY): Payer: Self-pay | Admitting: Occupational Therapy

## 2014-09-02 DIAGNOSIS — IMO0002 Reserved for concepts with insufficient information to code with codable children: Secondary | ICD-10-CM | POA: Diagnosis not present

## 2014-09-02 DIAGNOSIS — M48061 Spinal stenosis, lumbar region without neurogenic claudication: Secondary | ICD-10-CM | POA: Diagnosis not present

## 2014-09-02 DIAGNOSIS — E1149 Type 2 diabetes mellitus with other diabetic neurological complication: Secondary | ICD-10-CM | POA: Diagnosis not present

## 2014-09-02 DIAGNOSIS — Z5189 Encounter for other specified aftercare: Secondary | ICD-10-CM | POA: Diagnosis not present

## 2014-09-02 LAB — GLUCOSE, CAPILLARY
GLUCOSE-CAPILLARY: 158 mg/dL — AB (ref 70–99)
Glucose-Capillary: 115 mg/dL — ABNORMAL HIGH (ref 70–99)
Glucose-Capillary: 136 mg/dL — ABNORMAL HIGH (ref 70–99)
Glucose-Capillary: 154 mg/dL — ABNORMAL HIGH (ref 70–99)

## 2014-09-02 LAB — URINE CULTURE: Colony Count: >=100000

## 2014-09-02 NOTE — Progress Notes (Signed)
[facility] PHYSICAL MEDICINE & REHABILITATION     PROGRESS NOTE    Subjective/Complaints: No new complaints. Happy she moved bowels. Pain controlled. Pleased with therapies yesterday  Objective: Vital Signs: Blood pressure 112/46, pulse 62, temperature 98.1 F (36.7 C), temperature source Oral, resp. rate 17, height  (1.753 m), weight 74.571 kg (164 lb 6.4 oz), SpO2 94.00%. No results found.  Recent Labs  09/01/14 0555  WBC 10.7*  HGB 9.8*  HCT 29.1*  PLT 284    Recent Labs  09/01/14 0555  NA 137  K 4.1  CL 100  GLUCOSE 115*  BUN 24*  CREATININE 0.98  CALCIUM 8.7   CBG (last 3)   Recent Labs  09/01/14 1703 09/01/14 2111 09/02/14 0653  GLUCAP 176* 126* 115*    Wt Readings from Last 3 Encounters:  08/31/14 74.571 kg (164 lb 6.4 oz)  08/25/14 86.4 kg (190 lb 7.6 oz)  08/25/14 86.4 kg (190 lb 7.6 oz)    Physical Exam:  Nursing note and vitals reviewed.  Constitutional: She is oriented to person, place, and time. She appears well-developed and well-nourished.  HENT: oral mucosa pink and moist  Head: Normocephalic and atraumatic.  Eyes: Conjunctivae are normal. Pupils are equal, round, and reactive to light.  Neck: Normal range of motion. Neck supple.  Cardiovascular: Normal rate and regular rhythm. No murmurs or rubs.  Respiratory: Effort normal and breath sounds normal. No respiratory distress. She has no wheezes. No rales  GI: Soft. Bowel sounds are normal. She exhibits no distension. There is no tenderness.  Musculoskeletal: She exhibits no edema.  Back pain with SLR BLE. Left hip pain with ROM at hip.  Neurological: She is alert and oriented to person, place, and time. No cranial nerve deficit. Coordination normal.  Good sitting balance. Still some weakness (4-) in left quads, hip adductors. HF are 4- as well (pain). ADF/APF 5/5. UE's grossly 5/5 prox to distal. Pain in L3 distribution, left > right legs. No focal sensory loss is appreciated however  except for mild stocking glove sensory loss in the feet (to ankles). Cognitively displays normal insight, awareness, memory.  Skin: Skin is warm and dry.  Psychiatric: She has a normal mood and affect. Her behavior is normal. Thought content normal      Assessment/Plan: 1. Functional deficits secondary to Left L3 lumbar radiculopathy/stenosis s/p L3-4 laminectomy and fusion which require 3+ hours per day of interdisciplinary therapy in a comprehensive inpatient rehab setting. Physiatrist is providing close team supervision and 24 hour management of active medical problems listed below. Physiatrist and rehab team continue to assess barriers to discharge/monitor patient progress toward functional and medical goals. FIM: FIM - Bathing Bathing Steps Patient Completed: Chest;Right Arm;Left Arm;Abdomen;Front perineal area;Right upper leg;Left upper leg Bathing: 3: Mod-Patient completes 5-7 79f 10 parts or 50-74%  FIM - Upper Body Dressing/Undressing Upper body dressing/undressing steps patient completed: Thread/unthread right sleeve of pullover shirt/dresss;Thread/unthread left sleeve of pullover shirt/dress;Put head through opening of pull over shirt/dress Upper body dressing/undressing: 4: Min-Patient completed 75 plus % of tasks FIM - Lower Body Dressing/Undressing Lower body dressing/undressing: 1: Total-Patient completed less than 25% of tasks        FIM - Banker Devices: Walker;Arm rests;Bed rails Bed/Chair Transfer: 4: Supine > Sit: Min A (steadying Pt. > 75%/lift 1 leg);4: Sit > Supine: Min A (steadying pt. > 75%/lift 1 leg);4: Bed > Chair or W/C: Min A (steadying Pt. > 75%);4: Chair or W/C >  Bed: Min A (steadying Pt. > 75%)  FIM - Locomotion: Wheelchair Distance: 150 Locomotion: Wheelchair: 5: Travels 150 ft or more: maneuvers on rugs and over door sills with supervision, cueing or coaxing FIM - Locomotion: Ambulation Locomotion: Ambulation  Assistive Devices: Designer, industrial/product Ambulation/Gait Assistance: 4: Min assist Locomotion: Ambulation: 1: Travels less than 50 ft with minimal assistance (Pt.>75%)  Comprehension Comprehension Mode: Auditory Comprehension: 5-Understands complex 90% of the time/Cues < 10% of the time  Expression Expression Mode: Verbal Expression: 5-Expresses complex 90% of the time/cues < 10% of the time  Social Interaction Social Interaction: 5-Interacts appropriately 90% of the time - Needs monitoring or encouragement for participation or interaction.  Problem Solving Problem Solving: 5-Solves complex 90% of the time/cues < 10% of the time  Memory Memory: 5-Recognizes or recalls 90% of the time/requires cueing < 10% of the time Medical Problem List and Plan:  1. Functional deficits secondary to Left Lumbar 3 radiculopathy/stenosis s/p L3-4 laminectomy and fusion  2. DVT Prophylaxis/Anticoagulation: Mechanical: Sequential compression devices, below knee Bilateral lower extremities  3. Pain Management: now on oxycodone prn for pain management.   -gabapentin for radicular pain   -improved control 4. Mood: Team to provide ego support. LCSW to follow for evaluation and support.  5. Neuropsych: This patient is capable of making decisions on her own behalf.  6. Skin/Wound Care:   - Routine pressure relief measures.   -nutrition 7. DM type 2:  . Continue lantus 10 units and and use SSI for elevated BS.   -reasonable control at present 8. 100 GNR in urine:  continuekelfex empirically.  .  9. HTN: Monitor BP every 8 hours. Continue norvasc, Lisinopril and tenormin  10. Constipation: augment bowel regimen 11. FEN: encourage fluids  LOS (Days) 2 A FACE TO FACE EVALUATION WAS PERFORMED  SWARTZ,ZACHARY T 09/02/2014 8:34 AM

## 2014-09-02 NOTE — Progress Notes (Signed)
Occupational Therapy Session Note  Patient Details  Name: April [name] MRN: [id number] Date of Birth: 10-04-1925  Today's Date: 09/02/2014 OT Individual Time: 1500-1530 OT Individual Time Calculation (min): 30 min    Short Term Goals: Week 1:  OT Short Term Goal 1 (Week 1): STG=LTG due to anticipated short LOS  Skilled Therapeutic Interventions/Progress Updates:    Pt transitioned supine to sit EOB with min assist.  She was able to state 3/3/ back precautions with only min questioning cues.  Min demonstrational cueing for donning soft corset sitting on the edge of the bed.  Pt then transferred to the wheelchair with min assist using the RW.  She needed min instructional cueing for safe technique as she initially attempted to walk backwards with the walker instead of getting as close as she could walking forward and then turning to sit in the chair.  Took pt down to the ADL tub room to practice tub/shower transfers with use of the tub bench, which she has at home.  She was able to perform with min assist.  Slight difficulty lifting her LEs over the edge of the tub in sitting but she was able to complete using her UEs to self assist.  Finished session by having her roll her wheelchair back to the room with supervision.  She needed one rest break to complete task.  O2 sats 94% on room air and HR 74 after pushing wheelchair.   Therapy Documentation Precautions:  Precautions Precautions: Back;Fall Precaution Booklet Issued: Yes (comment) Precaution Comments: pt able to report 3/3 back precautions.   Required Braces or Orthoses: Spinal Brace Spinal Brace: Applied in sitting position;Lumbar corset Restrictions Weight Bearing Restrictions: No  Pain: Pain Assessment Pain Assessment: 0-10 Pain Score: 8  Pain Type: Surgical pain Pain Location: Back Pain Orientation: Lower Pain Descriptors / Indicators: Radiating (down B knees) Pain Onset: On-going Pain Intervention(s): Medication (See  eMAR);Repositioned Multiple Pain Sites: No 2nd Pain Site Pain Score: 8 Pain Type: Acute pain Pain Location: Leg Pain Orientation: Right Pain Intervention(s): Medication (See eMAR) ADL: See FIM for current functional status  Therapy/Group: Individual Therapy  Gerrald Basu OTR/L 09/02/2014, 4:00 PM

## 2014-09-02 NOTE — Progress Notes (Signed)
Recreational Therapy Session Note  Patient Details  Name: April [name] MRN: [id number] Date of Birth: 06/26/1925 Today's Date: 09/02/2014  Met with pt & daugters to discuss TR services.  Due to pt's current pain & fatigue levels, no formal TR implemented at this time.  Will continue to monitor through team. Archana Eckman 09/02/2014, 5:12 PM

## 2014-09-02 NOTE — Progress Notes (Signed)
Social Work Patient ID: April [name], female   DOB: [birth year], 80 y.o.   MRN: [id number]  Met with patient and daughters today to review team conference.  All aware and agreeable with targeted d/c date of 9/15 with supervision goals overall.  No concerns.  Have also alerted WC CM of targets.  Will continue to follow.  Authur Cubit, LCSW

## 2014-09-02 NOTE — Progress Notes (Signed)
Occupational Therapy Session Note  Patient Details  Name: April [name] MRN: [id number] Date of Birth: June 24, 1925  Today's Date: 09/02/2014 OT Individual Time: 0900-1000 OT Individual Time Calculation (min): 60 min   Short Term Goals: Week 1:  OT Short Term Goal 1 (Week 1): STG=LTG due to anticipated short LOS  Skilled Therapeutic Interventions/Progress Updates:  Patient resting in w/c upon arrival stating that she had not been doing well because of her blood pressure therefore decided to complete all BADL tasks at a w/c level at the sink and patient retrieved her clothes from the drawers also from a w/c level.  Focused session on activity tolerance, adhering to back precautions, practice donn and doff lumbar corset, adaptive techniques, sit><stands and standing tolerance.  Patient reports that her family will bring in her AE so she can practice with it and some regular shoes.  Therapy Documentation Precautions:  Precautions Precautions: Back;Fall Precaution Booklet Issued: Yes (comment) Precaution Comments: pt able to report 3/3 back precautions.   Required Braces or Orthoses: Spinal Brace Spinal Brace: Applied in sitting position;Lumbar corset Restrictions Weight Bearing Restrictions: No Pain: 6/10 lower back, RN aware yet premedicated, rest and repositioned. ADL:  See FIM for current functional status  Therapy/Group: Individual Therapy  Armanie Martine 09/02/2014, 7:57 AM

## 2014-09-02 NOTE — Progress Notes (Signed)
Physical Therapy Session Note  Patient Details  Name: April [name] MRN: [id number] Date of Birth: 10-Apr-1925  Today's Date: 09/02/2014 PT Individual Time: 1000-1100 Tx 2: 1430-1500 PT Individual Time Calculation (min): 60 min Tx 2: 30 min  Short Term Goals: Week 1:  PT Short Term Goal 1 (Week 1): STG=LTG due to ELOS  Skilled Therapeutic Interventions/Progress Updates:    W/C Management: PT instructs pt in w/c propulsion x 170' x 2 reps req SBA and verbal cues for UE management with turns and to propel in a straight line on level surface.   Gait Training: PT instructs pt in ambulation with RW x 35' req CGA + 40' req SBA and verbal cues for safe distance from walker and encouragement to increase distance. Endurance is limited by pain.   Neuromuscular Reeducation: PT instructs pt in TUG with RW x 3 reps; pt scores 58 seconds, 46 seconds, and 46 seconds. No LOB req assist to correct.   Therapeutic Activity: PT instructs pt in sit to R side lie on mat req min A for bottom leg, in log rolling R to supine req verbal cues and SBA. Pt c/o significant pain in back in this position, which did not decrease with B knees bent with pillows under knees for support, and so pt instructed to log roll R req min A at hips to avoid twisting, and R side lie to sit req min A at trunk.   Tx 2: Therapeutic Exercise: PT instructs pt in B LE ROM and strengthening exercises: ankle pumps x 30 reps, heel slides 3 x 10, supine hip abduction/adduction 3 x 10, SLR AAROM 3 x 10, hip ER/IR to neutral in B hook lie 3 x 10, isometric glute squeezes 3 x 10, isometric abdominal squeezes 3 x 10.   Pt's ability to move functionally appears primarily limited by pain. Pt demonstrates forgetfulness, likely due to distraction by pain, and unlocks brakes prior to standing from w/c and cannot recall what log rolling is when asked. PT spoke with pt's RN and recommended that the RN stay exactly current on giving pain meds to better  manage pt's pain and RN agreed. Continue with generalized endurance training, B UE/LE strengthening, functional mobility - particularly bed/mat mobility while following spinal precautions, and safety with gait - focusing on dynamic balance. Pt's L LE is generally weaker than R LE and has almost no functional hip ER from semi-recent hip fracture in April 2015 that never fully recovered. Cont per PT POC.    Therapy Documentation Precautions:  Precautions Precautions: Back;Fall Precaution Booklet Issued: Yes (comment) Precaution Comments: pt able to report 3/3 back precautions.   Required Braces or Orthoses: Spinal Brace Spinal Brace: Applied in sitting position;Lumbar corset Restrictions Weight Bearing Restrictions: No Vital Signs: Therapy Vitals Temp: 98.5 F (36.9 C) Temp src: Oral Pulse Rate: 58 BP: 112/47 mmHg Patient Position (if appropriate): Sitting Oxygen Therapy SpO2: 99 % O2 Device: None (Room air) Pain: Pain Assessment Pain Assessment: 0-10 Pain Score: 6  Pain Type: Surgical pain;Neuropathic pain Pain Location: Back Pain Orientation: Lower Pain Descriptors / Indicators: Radiating Pain Frequency: Constant Pain Onset: On-going Patients Stated Pain Goal: 3 Pain Intervention(s): Rest Multiple Pain Sites: No Tx 2: Pt c/o 6/10 low back pain in surgical site, radiating down B legs. PT utilizes rest and repositioning to reduce pain.     Balance: Balance Balance Assessed: Yes Timed Up and Go Test TUG: Normal TUG Normal TUG (seconds): 46  See FIM for current functional  status  Therapy/Group: Individual Therapy  Blue Winther M 09/02/2014, 10:03 AM

## 2014-09-03 ENCOUNTER — Inpatient Hospital Stay (HOSPITAL_COMMUNITY): Payer: Worker's Compensation | Admitting: Physical Therapy

## 2014-09-03 ENCOUNTER — Inpatient Hospital Stay (HOSPITAL_COMMUNITY): Payer: Worker's Compensation

## 2014-09-03 DIAGNOSIS — Z5189 Encounter for other specified aftercare: Secondary | ICD-10-CM | POA: Diagnosis not present

## 2014-09-03 DIAGNOSIS — E1149 Type 2 diabetes mellitus with other diabetic neurological complication: Secondary | ICD-10-CM | POA: Diagnosis not present

## 2014-09-03 DIAGNOSIS — M48061 Spinal stenosis, lumbar region without neurogenic claudication: Secondary | ICD-10-CM | POA: Diagnosis not present

## 2014-09-03 DIAGNOSIS — IMO0002 Reserved for concepts with insufficient information to code with codable children: Secondary | ICD-10-CM | POA: Diagnosis not present

## 2014-09-03 LAB — URINALYSIS, ROUTINE W REFLEX MICROSCOPIC
BILIRUBIN URINE: NEGATIVE
GLUCOSE, UA: NEGATIVE mg/dL
KETONES UR: NEGATIVE mg/dL
NITRITE: POSITIVE — AB
PH: 5.5 (ref 5.0–8.0)
Protein, ur: 30 mg/dL — AB
SPECIFIC GRAVITY, URINE: 1.019 (ref 1.005–1.030)
Urobilinogen, UA: 1 mg/dL (ref 0.0–1.0)

## 2014-09-03 LAB — GLUCOSE, CAPILLARY
GLUCOSE-CAPILLARY: 150 mg/dL — AB (ref 70–99)
Glucose-Capillary: 139 mg/dL — ABNORMAL HIGH (ref 70–99)
Glucose-Capillary: 147 mg/dL — ABNORMAL HIGH (ref 70–99)
Glucose-Capillary: 155 mg/dL — ABNORMAL HIGH (ref 70–99)

## 2014-09-03 LAB — URINE MICROSCOPIC-ADD ON

## 2014-09-03 MED ORDER — SODIUM CHLORIDE 0.9 % IV SOLN
500.0000 mg | Freq: Three times a day (TID) | INTRAVENOUS | Status: DC
  Administered 2014-09-03 – 2014-09-07 (×12): 500 mg via INTRAVENOUS
  Filled 2014-09-03 (×15): qty 500

## 2014-09-03 MED ORDER — POLYETHYLENE GLYCOL 3350 17 G PO PACK
17.0000 g | PACK | Freq: Every day | ORAL | Status: DC
  Administered 2014-09-03 – 2014-09-08 (×6): 17 g via ORAL
  Filled 2014-09-03 (×7): qty 1

## 2014-09-03 MED ORDER — FLEET ENEMA 7-19 GM/118ML RE ENEM: 1.0000 | ENEMA | Freq: Every day | RECTAL | Status: DC | PRN

## 2014-09-03 NOTE — Progress Notes (Signed)
ANTIBIOTIC CONSULT NOTE - INITIAL  Pharmacy Consult for Primaxin Indication: UTI  No Known Allergies  Patient Measurements: Height:  (175.3 cm) Weight: 163 lb 2.3 oz (74 kg) IBW/kg (Calculated) : 66.2   Vital Signs: Temp: 98 F (36.7 C) (09/10 0545) BP: 141/60 mmHg (09/10 0823) Pulse Rate: 60 (09/10 0545) Intake/Output from previous day: 09/09 0701 - 09/10 0700 In: 1200 [P.O.:1200] Out: -  Intake/Output from this shift: Total I/O In: 240 [P.O.:240] Out: -   Labs:  Recent Labs  09/01/14 0555  WBC 10.7*  HGB 9.8*  PLT 284  CREATININE 0.98   Estimated Creatinine Clearance: 43.1 ml/min (by C-G formula based on Cr of 0.98). No results found for this basename: VANCOTROUGH, Leodis Binet, VANCORANDOM, GENTTROUGH, GENTPEAK, GENTRANDOM, TOBRATROUGH, TOBRAPEAK, TOBRARND, AMIKACINPEAK, AMIKACINTROU, AMIKACIN,  in the last 72 hours   Microbiology: Recent Results (from the past 720 hour(s))  SURGICAL PCR SCREEN     Status: Abnormal   Collection Time    08/21/14  1:05 PM      Result Value Ref Range Status   MRSA, PCR POSITIVE (*) NEGATIVE Final   Staphylococcus aureus POSITIVE (*) NEGATIVE Final   Comment:            The Xpert SA Assay (FDA     approved for NASAL specimens     in patients over 77 years of age),     is one component of     a comprehensive surveillance     program.  Test performance has     been validated by [employer] for patients greater     than or equal to 84 year old.     It is not intended     to diagnose infection nor to     guide or monitor treatment.  URINE CULTURE     Status: None   Collection Time    08/31/14  3:37 AM      Result Value Ref Range Status   Specimen Description URINE, CLEAN CATCH   Final   Special Requests NONE   Final   Culture  Setup Time     Final   Value: 08/31/2014 04:00     Performed at [facility] Count     Final   Value: >=100,000 COLONIES/ML     Performed at [facility]   Culture     Final   Value: KLEBSIELLA OXYTOCA     Note: Confirmed Extended Spectrum Beta-Lactamase Producer (ESBL) CRITICAL RESULT CALLED TO, READ BACK BY AND VERIFIED WITH: TAYLOR P @ 2:32PM 09/02/14 BY DWEEKS     Performed at [facility]   Report Status 09/02/2014 FINAL   Final   Organism ID, Bacteria KLEBSIELLA OXYTOCA   Final    Medical History: Past Medical History  Diagnosis Date  . Hyperlipidemia 04/07/2014    takes Lovastatin daily  . OA (osteoarthritis) 04/07/2014  . Hypertension     takes Amlodipine and Atenolol daily  . HTN (hypertension) 04/07/2014  . Diabetes mellitus without complication     takes Lantus daily  . Hypothyroidism 04/07/2014    takes Synthroid daily  . History of bronchitis 20 yrs ago  . History of lupus 18yrs ago  . Joint pain   . Peripheral edema   . UTI (lower urinary tract infection)     completed antibioitcs on 07/28/14  . Urinary frequency     takes Vesicare daily  . Chronic back pain  stenosis  . Constipation     takes an OTC stool softener   . GERD (gastroesophageal reflux disease)     not on Protonix daily  . Nocturia   . Cataract     left eye  . History of shingles     Medications:  Scheduled:  . amLODipine  10 mg Oral Daily  . aspirin EC  81 mg Oral Daily  . atenolol  100 mg Oral Daily  . cephALEXin  250 mg Oral 3 times per day  . conjugated estrogens  1 Applicatorful Vaginal Q48H  . fenofibrate  54 mg Oral Daily  . insulin glargine  10 Units Subcutaneous QHS  . levothyroxine  125 mcg Oral QAC breakfast  . lisinopril  40 mg Oral Daily  . pantoprazole  40 mg Oral BID  . polyethylene glycol  17 g Oral Daily  . senna  1 tablet Oral BID  . simvastatin  5 mg Oral q1800  . [START ON 09/07/2014] Vitamin D (Ergocalciferol)  50,000 Units Oral Q7 days   Assessment: 80 y.o female with ESBL in urine--sensitive to imipenem only. MD notes that patient reports increasing frequency, increase in pain as well as difficulty voiding  today. She also reports strong urine. Plan is to treat with imipenem per pharmacy protocol as patient is symptomatic and with two recent surgeries. Rechecking urine culture and CBC.  NKDA. SCr = 0.98,  Estimated CrCl ~43 ml/min.    Goal of Therapy:  Antibiotic dosing appropriate for patient's renal function.  Eradicate  Urinary tract infection  Plan:  Primaxin  IV q8hr. Monitor clinical status and renal function daily.   Noah Delaine, RPh Clinical Pharmacist Pager: [phone] 09/03/2014,11:53 AM

## 2014-09-03 NOTE — IPOC Note (Signed)
Overall Plan of Care [facility]) Patient Details Name: April [name] MRN: [id number] DOB: 26-Jul-1925  Admitting Diagnosis: rehab  Hospital Problems: Active Problems:   Left lumbar radiculopathy     Functional Problem List: Nursing    PT Balance;Safety;Motor;Pain;Endurance  OT Endurance;Pain;Safety  SLP    TR         Basic ADL's: OT Bathing;Dressing;Toileting     Advanced  ADL's: OT       Transfers: PT Bed Mobility;Bed to Chair;Car;Furniture;Floor  OT Toilet;Tub/Shower     Locomotion: PT Ambulation;Stairs     Additional Impairments: OT    SLP        TR      Anticipated Outcomes Item Anticipated Outcome  Self Feeding Independent  Swallowing      Basic self-care  Music therapist  Supervision   Bathroom Transfers Supervision  Bowel/Bladder     Transfers  (S)  Locomotion  (S) for household ambulation, (S) for community w/c propulsion  Communication     Cognition     Pain     Safety/Judgment      Therapy Plan: PT Intensity: Minimum of 1-2 x/day ,45 to 90 minutes PT Frequency: 5 out of 7 days PT Duration Estimated Length of Stay: 7 days OT Intensity: Minimum of 1-2 x/day, 45 to 90 minutes OT Frequency: 5 out of 7 days OT Duration/Estimated Length of Stay: 7 days         Team Interventions: Nursing Interventions    PT interventions Ambulation/gait training;Community reintegration;Discharge planning;DME/adaptive equipment instruction;Functional mobility training;Pain management;Patient/family education;Therapeutic Exercise;Therapeutic Activities  OT Interventions Therapeutic Activities;Self Care/advanced ADL retraining;Therapeutic Exercise;Pain management;Functional mobility training;DME/adaptive equipment instruction;UE/LE Strength taining/ROM;Patient/family education  SLP Interventions    TR Interventions    SW/CM Interventions Psychosocial Support;Discharge Planning;Patient/Family Education    Team Discharge  Planning: Destination: PT-Home ,OT- Home , SLP-  Projected Follow-up: PT-Home health PT, OT-  Home health OT, SLP-  Projected Equipment Needs: PT- , OT- To be determined, SLP-  Equipment Details: PT-Pt has RW and wheelchair at home, OT-  Patient/family involved in discharge planning: PT- Patient,  OT-Patient, SLP-   MD ELOS: 7 days Medical Rehab Prognosis:  Excellent Assessment: The patient has been admitted for CIR therapies with the diagnosis of lumbar radiculopathy s/p decompression and fusion. The team will be addressing functional mobility, strength, stamina, balance, safety, adaptive techniques and equipment, self-care, bowel and bladder mgt, patient and caregiver education, pain mgt, back precautions, leisure awareness, community reintegration. Goals have been set at supervision for mobility and supervision to min assist with self-care.    Ranelle Oyster, MD, FAAPMR      See Team Conference Notes for weekly updates to the plan of care

## 2014-09-03 NOTE — Progress Notes (Signed)
[place] PHYSICAL MEDICINE & REHABILITATION     PROGRESS NOTE    Subjective/Complaints: Complains of left eye irritation last night. Bowels a little slow also Objective: Vital Signs: Blood pressure 141/60, pulse 60, temperature 98 F (36.7 C), temperature source Oral, resp. rate 16, height  (1.753 m), weight 74 kg (163 lb 2.3 oz), SpO2 100.00%. No results found.  Recent Labs  09/01/14 0555  WBC 10.7*  HGB 9.8*  HCT 29.1*  PLT 284    Recent Labs  09/01/14 0555  NA 137  K 4.1  CL 100  GLUCOSE 115*  BUN 24*  CREATININE 0.98  CALCIUM 8.7   CBG (last 3)   Recent Labs  09/02/14 1627 09/02/14 2114 09/03/14 0647  GLUCAP 158* 154* 139*    Wt Readings from Last 3 Encounters:  09/02/14 74 kg (163 lb 2.3 oz)  08/25/14 86.4 kg (190 lb 7.6 oz)  08/25/14 86.4 kg (190 lb 7.6 oz)    Physical Exam:  Nursing note and vitals reviewed.  Constitutional: She is oriented to person, place, and time. She appears well-developed and well-nourished.  HENT: oral mucosa pink and moist  Head: Normocephalic and atraumatic.  Eyes: Conjunctivae are normal. Pupils are equal, round, and reactive to light. (no redness OS) Neck: Normal range of motion. Neck supple.  Cardiovascular: Normal rate and regular rhythm. No murmurs or rubs.  Respiratory: Effort normal and breath sounds normal. No respiratory distress. She has no wheezes. No rales  GI: Soft. Bowel sounds are normal. She exhibits no distension. There is no tenderness.  Musculoskeletal: She exhibits no edema.  Back pain with SLR BLE. Left hip pain with ROM at hip.  Neurological: She is alert and oriented to person, place, and time. No cranial nerve deficit. Coordination normal.  Good sitting balance. Still some weakness (4-) in left quads, hip adductors. HF are 4- as well (pain). ADF/APF 5/5. UE's grossly 5/5 prox to distal. Pain in L3 distribution, left > right legs. No focal sensory loss is appreciated however except for mild  stocking glove sensory loss in the feet (to ankles). Cognitively displays normal insight, awareness, memory.  Skin: Skin is warm and dry.  Psychiatric: She has a normal mood and affect. Her behavior is normal. Thought content normal      Assessment/Plan: 1. Functional deficits secondary to Left L3 lumbar radiculopathy/stenosis s/p L3-4 laminectomy and fusion which require 3+ hours per day of interdisciplinary therapy in a comprehensive inpatient rehab setting. Physiatrist is providing close team supervision and 24 hour management of active medical problems listed below. Physiatrist and rehab team continue to assess barriers to discharge/monitor patient progress toward functional and medical goals. FIM: FIM - Bathing Bathing Steps Patient Completed: Chest;Right Arm;Left Arm;Abdomen;Front perineal area;Right upper leg;Left upper leg Bathing: 3: Mod-Patient completes 5-7 79f 10 parts or 50-74%  FIM - Upper Body Dressing/Undressing Upper body dressing/undressing steps patient completed: Thread/unthread right sleeve of pullover shirt/dresss;Thread/unthread left sleeve of pullover shirt/dress;Put head through opening of pull over shirt/dress;Pull shirt over trunk Upper body dressing/undressing: 5: Set-up assist to: Apply TLSO, cervical collar FIM - Lower Body Dressing/Undressing Lower body dressing/undressing steps patient completed: Thread/unthread right pants leg;Thread/unthread left pants leg Lower body dressing/undressing: 1: Total-Patient completed less than 25% of tasks        FIM - Banker Devices: Walker;Arm rests Bed/Chair Transfer: 4: Supine > Sit: Min A (steadying Pt. > 75%/lift 1 leg);4: Bed > Chair or W/C: Min A (steadying Pt. > 75%)  FIM - Locomotion: Wheelchair Distance: 170 Locomotion: Wheelchair: 5: Travels 150 ft or more: maneuvers on rugs and over door sills with supervision, cueing or coaxing FIM - Locomotion:  Ambulation Locomotion: Ambulation Assistive Devices: Designer, industrial/product Ambulation/Gait Assistance: 5: Supervision Locomotion: Ambulation: 1: Travels less than 50 ft with supervision/safety issues  Comprehension Comprehension Mode: Auditory Comprehension: 5-Understands complex 90% of the time/Cues < 10% of the time  Expression Expression Mode: Verbal Expression: 5-Expresses complex 90% of the time/cues < 10% of the time  Social Interaction Social Interaction: 6-Interacts appropriately with others with medication or extra time (anti-anxiety, antidepressant).  Problem Solving Problem Solving: 5-Solves complex 90% of the time/cues < 10% of the time  Memory Memory: 5-Requires cues to use assistive device Medical Problem List and Plan:  1. Functional deficits secondary to Left Lumbar 3 radiculopathy/stenosis s/p L3-4 laminectomy and fusion  2. DVT Prophylaxis/Anticoagulation: Mechanical: Sequential compression devices, below knee Bilateral lower extremities  3. Pain Management: now on oxycodone prn for pain management.   -gabapentin for radicular pain   -improved control 4. Mood: Team to provide ego support. LCSW to follow for evaluation and support.  5. Neuropsych: This patient is capable of making decisions on her own behalf.  6. Skin/Wound Care:   - Routine pressure relief measures.   -nutrition 7. DM type 2:  . Continue lantus 10 units and and use SSI for elevated BS.   -reasonable control  8. 100 GNR in urine:  continuekelfex empirically.  .  9. HTN: Monitor BP every 8 hours. Continue norvasc, Lisinopril and tenormin  10. Constipation: augment bowel regimen with daily miralax. Supp/enema prn 11. FEN: encourage fluids  LOS (Days) 3 A FACE TO FACE EVALUATION WAS PERFORMED  SWARTZ,ZACHARY T 09/03/2014 9:06 AM

## 2014-09-03 NOTE — Progress Notes (Signed)
Occupational Therapy Session Note  Patient Details  Name: April [name] MRN: [id number] Date of Birth: 1925/08/24  Today's Date: 09/03/2014 OT Individual Time: 1100-1200 OT Individual Time Calculation (min): 60 min    Short Term Goals: Week 1:  OT Short Term Goal 1 (Week 1): STG=LTG due to anticipated short LOS  Skilled Therapeutic Interventions/Progress Updates: ADL-retraining with focus on improved safety awareness, adherence to back precautions, and adapted bathing/dressing skills.   Pt received seated in her w/c reported moderate pain, unresolved with meds.    Pt receptive for shower to reduce pain but required repeated directions to sequence through task due to increased confusion this date with decline in memory (immediate and short term).   Pt states she has AE needed to increase independence with ADL by extending her reach (sock aid, reacher, LH sponge)  But she is awaiting family members to recover AE from home.   Pt completed bathing and dressing with overall mod assist d/t no AE available.   After shower, pt reported decline in symptoms of pain and appeared more relaxed and at ease.   Pt remained in w/c at end of session with call light within reach.     Therapy Documentation Precautions:  Precautions Precautions: Back;Fall Precaution Booklet Issued: Yes (comment) Precaution Comments: pt able to report 3/3 back precautions.   Required Braces or Orthoses: Spinal Brace Spinal Brace: Applied in sitting position;Lumbar corset Restrictions Weight Bearing Restrictions: No  Pain: Pain Assessment Pain Assessment: 0-10 Pain Score: 8  Pain Type: Surgical pain Pain Location: Back Pain Orientation: Mid;Lower Pain Onset: On-going Pain Intervention(s): Shower;Distraction  ADL: ADL ADL Comments: see FIM   See FIM for current functional status  Therapy/Group: Individual Therapy  Terrianna Holsclaw 09/03/2014, 12:47 PM

## 2014-09-03 NOTE — Progress Notes (Signed)
Patient with ESBL in urine--sensitive to imipenem only. Patient reports increasing frequency, increase in pain as well as difficulty voiding today. She also reports strong urine. Will recheck Urine culture and CBC. Order post void checks. Will start imipenem as symptomatic and with two recent surgeries.   Back incision covered with honey comb dressing. No surrounding erythema or drainage. Non tender to touch.

## 2014-09-03 NOTE — Progress Notes (Signed)
Physical Therapy Session Note  Patient Details  Name: April [name] MRN: [id number] Date of Birth: 1925-10-15  Today's Date: 09/03/2014 PT Individual Time: 0830-0930 Tx 2: 1500-1600 PT Individual Time Calculation (min): 60 min  Tx 2: 60 min  Short Term Goals: Week 1:  PT Short Term Goal 1 (Week 1): STG=LTG due to ELOS  Skilled Therapeutic Interventions/Progress Updates:    Therapeutic Activity: PT instructs pt in bed mobility and log rolling, multiple reps of log rolling R and L. Pt requires repeated verbal cues to avoid twisting with logrolling and min A progressing to SBA. Pt req mod A for B LEs sit to supine to the R and verbal cues to stop in transition in side lie in order to avoid twisting. Pt demonstrates  Pt demonstrates ability of supine to/from sit to L req SBA and verbal cues; this is the side she gets in/out of bed at home. Pt req min A for L side lie to sit and specific verbal cues for hand placement.  PT instructs pt in bed to/from w/c transfer with RW req SBA each direction and verbal cues for safe walker use.   Gait Training: PT instructs pt in ambulation with RW x 50' x 3 reps req SBA and verbal cues for upright posture - no LOB during this gait activity.   W/C Management: PT instructs pt in w/c propulsion on level surface 150' with B UE req SBA and verbal cues/reminders to lock brakes prior to standing, specifically on the R side.   Tx 2: Therapeutic Activity: Pt requests to use bathroom and req a hat so RN can take a urine sample. PT instructs pt in ambulating to toilet with RW req SBA and verbal cues for upright posture and to avoid twisting spine. Pt req assist to doff pants, manages own hygiene and donning pants, then SBA when walking to sink to wash hands and then sit on edge of bed.  PT instructs pt in bed mobility sit to/from supine req SBA and verbal cues for technique to follow back precautions to the R and to the L, as well as verbal cues in log rolling R and  L. Pt req specific cues for hand placement and has poor carryover from AM session where bed mobility was practiced.   Therapeutic Exercise: PT instructs pt in B LE ROM and strengthening exercises: LAQ 3 x 10 reps, seated march 3 x 10 reps, toe taps in sit 3 x 10 reps, standing heel raises 3 x 10 reps, standing hip abduction 3 x 10 reps, mini-squats 3 x 10 reps, standing knee flexion 3 x 10 reps   Pt demonstrates poor short term memory and difficulty within session carryover, as well as between session carryover. Pt needs lots of repetition and simple cues to create habits of moving while following spinal precautions. Pt tends to panic during movement and grasp at whatever is in reach with her hands, which results in her twisting her body and avoiding using her LEs to assist with bed mobility. PT is utilizing graded activity due to pt's high c/o pain, even with scheduled medication. Pt continues to require significant rest breaks in between bouts of activity. Pt demonstrates twisting of her spine with sit to stand, but this twist is significantly reduced when pt pushes off the bed/w/c with B arms. Continue with all functional mobility training, especially bed mobility, and B LE strengthening, and standing balance reeducation  Therapy Documentation Precautions:  Precautions Precautions: Back;Fall Precaution Booklet Issued:  Yes (comment) Precaution Comments: pt able to report 3/3 back precautions.   Required Braces or Orthoses: Spinal Brace Spinal Brace: Applied in sitting position;Lumbar corset Restrictions Weight Bearing Restrictions: No Vital Signs: Therapy Vitals Temp: 98 F (36.7 C) Pulse Rate: 60 Resp: 16 BP: 141/60 mmHg Oxygen Therapy SpO2: 100 % Pain: Pain Assessment Pain Assessment: 0-10 Pain Score: 8  Pain Type: Surgical pain Pain Location: Back Pain Orientation: Lower Pain Descriptors / Indicators: Radiating (to B knees) Pain Onset: On-going Pain Intervention(s): Medication  (See eMAR) Multiple Pain Sites: No Tx 2: Pt c/o 7/10 low back pain radiating down L leg only; PT utilizes rest to decrease pain.    See FIM for current functional status  Therapy/Group: Individual Therapy  April [name] 09/03/2014, 8:34 AM

## 2014-09-04 ENCOUNTER — Inpatient Hospital Stay (HOSPITAL_COMMUNITY): Payer: Worker's Compensation | Admitting: *Deleted

## 2014-09-04 ENCOUNTER — Inpatient Hospital Stay (HOSPITAL_COMMUNITY): Payer: Worker's Compensation | Admitting: Occupational Therapy

## 2014-09-04 ENCOUNTER — Inpatient Hospital Stay (HOSPITAL_COMMUNITY): Payer: Worker's Compensation

## 2014-09-04 DIAGNOSIS — Z5189 Encounter for other specified aftercare: Secondary | ICD-10-CM

## 2014-09-04 DIAGNOSIS — M48061 Spinal stenosis, lumbar region without neurogenic claudication: Secondary | ICD-10-CM

## 2014-09-04 DIAGNOSIS — E1142 Type 2 diabetes mellitus with diabetic polyneuropathy: Secondary | ICD-10-CM | POA: Diagnosis not present

## 2014-09-04 DIAGNOSIS — E1149 Type 2 diabetes mellitus with other diabetic neurological complication: Secondary | ICD-10-CM

## 2014-09-04 LAB — CBC WITH DIFFERENTIAL/PLATELET
BASOS ABS: 0 10*3/uL (ref 0.0–0.1)
BASOS PCT: 0 % (ref 0–1)
EOS ABS: 0.2 10*3/uL (ref 0.0–0.7)
Eosinophils Relative: 4 % (ref 0–5)
HEMATOCRIT: 27.8 % — AB (ref 36.0–46.0)
Hemoglobin: 9.2 g/dL — ABNORMAL LOW (ref 12.0–15.0)
Lymphocytes Relative: 22 % (ref 12–46)
Lymphs Abs: 1.4 10*3/uL (ref 0.7–4.0)
MCH: 31.3 pg (ref 26.0–34.0)
MCHC: 33.1 g/dL (ref 30.0–36.0)
MCV: 94.6 fL (ref 78.0–100.0)
MONO ABS: 0.5 10*3/uL (ref 0.1–1.0)
MONOS PCT: 7 % (ref 3–12)
Neutro Abs: 4.3 10*3/uL (ref 1.7–7.7)
Neutrophils Relative %: 67 % (ref 43–77)
Platelets: 270 10*3/uL (ref 150–400)
RBC: 2.94 MIL/uL — ABNORMAL LOW (ref 3.87–5.11)
RDW: 14 % (ref 11.5–15.5)
WBC: 6.3 10*3/uL (ref 4.0–10.5)

## 2014-09-04 LAB — BASIC METABOLIC PANEL
ANION GAP: 12 (ref 5–15)
BUN: 17 mg/dL (ref 6–23)
CALCIUM: 8.6 mg/dL (ref 8.4–10.5)
CO2: 24 mEq/L (ref 19–32)
CREATININE: 0.88 mg/dL (ref 0.50–1.10)
Chloride: 103 mEq/L (ref 96–112)
GFR, EST AFRICAN AMERICAN: 67 mL/min — AB (ref >90)
GFR, EST NON AFRICAN AMERICAN: 58 mL/min — AB (ref >90)
Glucose, Bld: 115 mg/dL — ABNORMAL HIGH (ref 70–99)
Potassium: 3.9 mEq/L (ref 3.7–5.3)
Sodium: 139 mEq/L (ref 137–147)

## 2014-09-04 LAB — GLUCOSE, CAPILLARY
GLUCOSE-CAPILLARY: 151 mg/dL — AB (ref 70–99)
GLUCOSE-CAPILLARY: 177 mg/dL — AB (ref 70–99)
GLUCOSE-CAPILLARY: 156 mg/dL — AB (ref 70–99)
Glucose-Capillary: 103 mg/dL — ABNORMAL HIGH (ref 70–99)

## 2014-09-04 LAB — URINE CULTURE

## 2014-09-04 MED ORDER — NAPHAZOLINE-PHENIRAMINE 0.025-0.3 % OP SOLN
1.0000 [drp] | Freq: Four times a day (QID) | OPHTHALMIC | Status: DC | PRN
  Filled 2014-09-04: qty 15

## 2014-09-04 MED ORDER — SORBITOL 70 % SOLN
30.0000 mL | Freq: Every day | Status: DC | PRN
  Administered 2014-09-05 – 2014-09-06 (×2): 30 mL via ORAL
  Filled 2014-09-04 (×2): qty 30

## 2014-09-04 NOTE — Progress Notes (Signed)
Physical Therapy Session Note  Patient Details  Name: April [name] MRN: [id number] Date of Birth: 1925/06/28  Today's Date: 09/04/2014 PT Individual Time:  8:00-9:00 ( )    Short Term Goals: Week 1:  PT Short Term Goal 1 (Week 1): STG=LTG due to ELOS  Skilled Therapeutic Interventions/Progress Updates:  Tx focused on graded activity for activity tolerance and strengthening, functional mobility training, and gait with RW.  Pt c/o 7/10 back and LE pain, modified tx, pt pre-medicated, rests provided. Pt able to recall all back precautions, but needed 1 cue to avoid twisting during scooting. Pt needed assist to tighten loose bac brace.   Functional mobility: Pt instructed in sit<>stand and stand-step transfers with RW, needing cues for hand placement and increasing LE activation. Min-guard A needed for safety, decreased eccentric control in sitting. Ptr performed WC mobility x150 with S and needing rest after, good stroke length. Pt needs assist for parts.   There ex: Pt instructed in [place] HEP with handout provided for strengthening and fall prevention with seated rest needed between each task. Performed bil x10 each LAQ, hip ABD, HS curl, heel/toe raises, and mini-squats. Pt unable to perform squats correctly, compensating with forward translation.  Pt performed strengthening on Kinetron x5 min in sitting with 2 brief rests needed.  Passive HS and heel cord stretching bil x66min each with increased release noted as ex progressed.   Gait training in controlled setting 2x25' with min-guard A due to general unsteadiness. Distance limited by fatigue. Pt relief heavily on UEs, cued for posture and LE weight-bearing.   Pt left up in [facility] with all needs in reach.      Therapy Documentation Precautions:  Precautions Precautions: Back;Fall Precaution Booklet Issued: Yes (comment) Precaution Comments: pt able to report 3/3 back precautions.   Required Braces or Orthoses: Spinal  Brace Spinal Brace: Applied in sitting position;Lumbar corset Restrictions Weight Bearing Restrictions: No General:   Vital Signs: Therapy Vitals BP: 143/60 mmHg    Locomotion : Ambulation Ambulation/Gait Assistance: 4: Min guard Wheelchair Mobility Distance: 150   See FIM for current functional status  Therapy/Group: Individual Therapy Clydene Laming, PT, DPT  09/04/2014, 8:45 AM

## 2014-09-04 NOTE — Progress Notes (Signed)
Occupational Therapy Session Note  Patient Details  Name: April [name] MRN: [id number] Date of Birth: 01/29/1925  Today's Date: 09/04/2014 OT Individual Time: 4098-1191 OT Individual Time Calculation (min): 60 min    Short Term Goals: Week 1:  OT Short Term Goal 1 (Week 1): STG=LTG due to anticipated short LOS  Skilled Therapeutic Interventions/Progress Updates:  Upon entering the room, pt seated in wheelchair with 2 daughters present during session. Pt reporting 8/10 pain in lower back but stated RN gave medication prior to therapist arrival. Pt also stating, "I am exhausted" and requiring coaxing to participate in session. Family brought AE for ADL training. OT educated pt on use of LH reacher, LH shoe horn, dressing stick, and sock aide for LB dressing this session. Pt returned demonstration by donning and doffing B socks, shoes, and threading elastic waist pants with verbal cues and increased time for proper technique. OT provided education on energy conservation techniques for ADL retraining as well as general techniques to utilize for in the community. Pt verbalized 3/3 back precautions with 100% accuracy.   Therapy Documentation Precautions:  Precautions Precautions: Back;Fall Precaution Booklet Issued: Yes (comment) Precaution Comments: pt able to report 3/3 back precautions.   Required Braces or Orthoses: Spinal Brace Spinal Brace: Applied in sitting position;Lumbar corset Restrictions Weight Bearing Restrictions: No  ADL: ADL ADL Comments: see FIM Exercises:   Other Treatments:    See FIM for current functional status  Therapy/Group: Individual Therapy  Lowella Grip 09/04/2014, 4:23 PM

## 2014-09-04 NOTE — Progress Notes (Signed)
[facility] RE[facility]ILIT[facility]     PROGRESS NOTE    Subjective/Complaints: Complains of left eye irritation still. Reports urinary frequency with dysuria  Objective: Vital Signs: Blood pressure 143/60, pulse 62, temperature 98 F (36.7 C), temperature source Oral, resp. rate 18, height  (1.753 m), weight 74 kg (163 lb 2.3 oz), SpO2 96.00%. No results found.  Recent Labs  09/04/14 0600  WBC 6.3  HGB 9.2*  HCT 27.8*  PLT 270    Recent Labs  09/04/14 0600  NA 139  K 3.9  CL 103  GLUCOSE 115*  BUN 17  CREATININE 0.88  CALCIUM 8.6   CBG (last 3)   Recent Labs  09/03/14 1718 09/03/14 2123 09/04/14 0726  GLUCAP 147* 155* 103*    Wt Readings from Last 3 Encounters:  09/02/14 74 kg (163 lb 2.3 oz)  08/25/14 86.4 kg (190 lb 7.6 oz)  08/25/14 86.4 kg (190 lb 7.6 oz)    Physical Exam:  Nursing note and vitals reviewed.  Constitutional: She is oriented to person, place, and time. She appears well-developed and well-nourished.  HENT: oral mucosa pink and moist  Head: Normocephalic and atraumatic.  Eyes: Conjunctivae are normal. Pupils are equal, round, and reactive to light. (still no redness OS) Neck: Normal range of motion. Neck supple.  Cardiovascular: Normal rate and regular rhythm. No murmurs or rubs.  Respiratory: Effort normal and breath sounds normal. No respiratory distress. She has no wheezes. No rales  GI: Soft. Bowel sounds are normal. She exhibits no distension. There is no tenderness.  Musculoskeletal: She exhibits no edema.  Back pain with SLR BLE. Left hip pain with ROM at hip.  Neurological: She is alert and oriented to person, place, and time. No cranial nerve deficit. Coordination normal.  Good sitting balance. Still some weakness (4-) in left quads, hip adductors. HF are 4- as well (pain). ADF/APF 5/5. UE's grossly 5/5 prox to distal. Pain in L3 distribution, left > right legs. No focal sensory loss is appreciated however  except for mild stocking glove sensory loss in the feet (to ankles). Cognitively displays normal insight, awareness, memory.  Skin: Skin is warm and dry.  Psychiatric: She has a normal mood and affect. Her behavior is normal. Thought content normal      Assessment/Plan: 1. Functional deficits secondary to Left L3 lumbar radiculopathy/stenosis s/p L3-4 laminectomy and fusion which require 3+ hours per day of interdisciplinary therapy in a comprehensive inpatient rehab setting. Physiatrist is providing close team supervision and 24 hour management of active medical problems listed below. Physiatrist and rehab team continue to assess barriers to discharge/monitor patient progress toward functional and medical goals. FIM: FIM - Bathing Bathing Steps Patient Completed: Chest;Right Arm;Left Arm;Abdomen;Front perineal area;Right upper leg;Left upper leg Bathing: 3: Mod-Patient completes 5-7 87f 10 parts or 50-74%  FIM - Upper Body Dressing/Undressing Upper body dressing/undressing steps patient completed: Thread/unthread right sleeve of pullover shirt/dresss;Put head through opening of pull over shirt/dress;Thread/unthread left sleeve of pullover shirt/dress;Pull shirt over trunk Upper body dressing/undressing: 5: Set-up assist to: Obtain clothing/put away FIM - Lower Body Dressing/Undressing Lower body dressing/undressing steps patient completed: Pull underwear up/down Lower body dressing/undressing: 1: Total-Patient completed less than 25% of tasks  FIM - Toileting Toileting steps completed by patient: Adjust clothing prior to toileting;Performs perineal hygiene;Adjust clothing after toileting Toileting Assistive Devices: Grab bar or rail for support Toileting: 4: Steadying assist  FIM - Diplomatic Services operational officer Devices: Best boy Transfers: 4-To  toilet/BSC: Min A (steadying Pt. > 75%);4-From toilet/BSC: Min A (steadying Pt. > 75%)  FIM - Physiological scientist Devices: Walker;Arm rests;Orthosis (tlso) Bed/Chair Transfer: 3: Sit > Supine: Mod A (lifting assist/Pt. 50-74%/lift 2 legs);4: Supine > Sit: Min A (steadying Pt. > 75%/lift 1 leg);5: Bed > Chair or W/C: Supervision (verbal cues/safety issues);5: Chair or W/C > Bed: Supervision (verbal cues/safety issues)  FIM - Locomotion: Wheelchair Distance: 150 Locomotion: Wheelchair: 5: Travels 150 ft or more: maneuvers on rugs and over door sills with supervision, cueing or coaxing FIM - Locomotion: Ambulation Locomotion: Ambulation Assistive Devices: Walker - Rolling;Orthosis (tlso) Ambulation/Gait Assistance: 5: Supervision Locomotion: Ambulation: 2: Travels 50 - 149 ft with supervision/safety issues  Comprehension Comprehension Mode: Auditory Comprehension: 4-Understands basic 75 - 89% of the time/requires cueing 10 - 24% of the time  Expression Expression Mode: Verbal Expression: 4-Expresses basic 75 - 89% of the time/requires cueing 10 - 24% of the time. Needs helper to occlude trach/needs to repeat words.  Social Interaction Social Interaction: 4-Interacts appropriately 75 - 89% of the time - Needs redirection for appropriate language or to initiate interaction.  Problem Solving Problem Solving: 5-Solves basic 90% of the time/requires cueing < 10% of the time  Memory Memory: 3-Recognizes or recalls 50 - 74% of the time/requires cueing 25 - 49% of the time Medical Problem List and Plan:  1. Functional deficits secondary to Left Lumbar 3 radiculopathy/stenosis s/p L3-4 laminectomy and fusion  2. DVT Prophylaxis/Anticoagulation: Mechanical: Sequential compression devices, below knee Bilateral lower extremities  3. Pain Management: now on oxycodone prn for pain management.   -gabapentin for radicular pain   -improved control 4. Mood: Team to provide ego support. LCSW to follow for evaluation and support.  5. Neuropsych: This patient is capable of making  decisions on her own behalf.  6. Skin/Wound Care:   - Routine pressure relief measures.   -nutrition 7. DM type 2:  . Continue lantus 10 units and and use SSI for elevated BS.   -reasonable control  8. 100k Klebsiella oxytoca: impenem for now.   -urine re-cultured  9. HTN: Monitor BP every 8 hours. Continue norvasc, Lisinopril and tenormin  10. Constipation: augment bowel regimen with daily miralax. Supp/enema prn 11. FEN: encourage fluids  LOS (Days) 4 A FACE TO FACE EVALUATION WAS PERFORMED  Skyelar Swigart T 09/04/2014 8:15 AM

## 2014-09-04 NOTE — Progress Notes (Signed)
Occupational Therapy Session Note  Patient Details  Name: April [name] MRN: [id number] Date of Birth: 1925/04/04  Today's Date: 09/04/2014 OT Individual Time: 1100-1200 OT Individual Time Calculation (min): 60 min    Short Term Goals: Week 1:  OT Short Term Goal 1 (Week 1): STG=LTG due to anticipated short LOS  Skilled Therapeutic Interventions/Progress Updates:  ADL-retraining at shower level with emphasis on adherence to back precautions and safety awareness, improved activity tolerance, pain management and re-ed on use of AE for lower body bathing and dressing.   Pt received seated in her w/c and reporting spike in pain (8/10).   With min redirection, pt agreed to shower to reduce symptoms of pain and improve alertness/attention.   Pt revealed that her daughter brought in her AE from home and she now has access to reacher, leg lifter, LH sponge, shoe horn and sock aid.  Pt used LH sponge to wash feet and back this session although she continues to require supervision to maintain back precautions restricting mobility with use of TLSO.    Pt ambulated in room with RW as directed  but required min guard assist d/t posterior lean and LE weakness with mild LOB 2 times during this session.   Pt groomed at sink seated in w/c.   Both daughters attended session and acknowledged OT recommendation to provide assist when needed for lower body dressing.   Pt left in w/c with daughter having conversation while awaiting noon meal.     Therapy Documentation Precautions:  Precautions Precautions: Back;Fall Precaution Booklet Issued: Yes (comment) Precaution Comments: pt able to report 3/3 back precautions.   Required Braces or Orthoses: Spinal Brace Spinal Brace: Applied in sitting position;Lumbar corset Restrictions Weight Bearing Restrictions: No  Pain: Pain Assessment Pain Assessment: 0-10 Pain Score: 8  Pain Type: Surgical pain Pain Location: Back Pain Orientation: Mid;Lower Pain  Descriptors / Indicators: Jabbing Pain Onset: On-going Pain Intervention(s): Shower  ADL: ADL ADL Comments: see FIM  See FIM for current functional status  Therapy/Group: Individual Therapy  Tonio Seider 09/04/2014, 12:27 PM

## 2014-09-05 ENCOUNTER — Inpatient Hospital Stay (HOSPITAL_COMMUNITY): Payer: Worker's Compensation | Admitting: Occupational Therapy

## 2014-09-05 ENCOUNTER — Inpatient Hospital Stay (HOSPITAL_COMMUNITY): Payer: Worker's Compensation | Admitting: Physical Therapy

## 2014-09-05 DIAGNOSIS — E1149 Type 2 diabetes mellitus with other diabetic neurological complication: Secondary | ICD-10-CM

## 2014-09-05 DIAGNOSIS — Z5189 Encounter for other specified aftercare: Principal | ICD-10-CM

## 2014-09-05 DIAGNOSIS — E1142 Type 2 diabetes mellitus with diabetic polyneuropathy: Secondary | ICD-10-CM

## 2014-09-05 DIAGNOSIS — IMO0002 Reserved for concepts with insufficient information to code with codable children: Secondary | ICD-10-CM

## 2014-09-05 DIAGNOSIS — M48061 Spinal stenosis, lumbar region without neurogenic claudication: Secondary | ICD-10-CM

## 2014-09-05 LAB — GLUCOSE, CAPILLARY
GLUCOSE-CAPILLARY: 112 mg/dL — AB (ref 70–99)
Glucose-Capillary: 105 mg/dL — ABNORMAL HIGH (ref 70–99)
Glucose-Capillary: 140 mg/dL — ABNORMAL HIGH (ref 70–99)

## 2014-09-05 NOTE — Progress Notes (Signed)
April [name] is a 80 y.o. female 1925-08-25 [id number]  Subjective: Continued burning pressure in L eye, but vision intact. no new problems. Slept well. Feeling well.  Objective: Vital signs in last 24 hours: Temp:  [98.3 F (36.8 C)-98.4 F (36.9 C)] 98.3 F (36.8 C) (09/12 0646) Pulse Rate:  [60-63] 63 (09/12 0646) Resp:  [17-18] 17 (09/12 0646) BP: (109-150)/(41-55) 150/55 mmHg (09/12 0646) SpO2:  [97 %-100 %] 100 % (09/12 0646) Weight change:  Last BM Date: 09/02/14  Intake/Output from previous day: 09/11 0701 - 09/12 0700 In: 720 [P.O.:720] Out: -   Physical Exam General: No apparent distress  L eye with watery clear DC, no erythema or conjunctivitis  Lungs: Normal effort. Lungs clear to auscultation, no crackles or wheezes. Cardiovascular: Regular rate and rhythm, no edema Wounds:  Clean, dry, intact. No signs of infection.  Lab Results: BMET    Component Value Date/Time   NA 139 09/04/2014 0600   K 3.9 09/04/2014 0600   CL 103 09/04/2014 0600   CO2 24 09/04/2014 0600   GLUCOSE 115* 09/04/2014 0600   BUN 17 09/04/2014 0600   CREATININE 0.88 09/04/2014 0600   CALCIUM 8.6 09/04/2014 0600   GFRNONAA 58* 09/04/2014 0600   GFRAA 67* 09/04/2014 0600   CBC    Component Value Date/Time   WBC 6.3 09/04/2014 0600   RBC 2.94* 09/04/2014 0600   HGB 9.2* 09/04/2014 0600   HCT 27.8* 09/04/2014 0600   PLT 270 09/04/2014 0600   MCV 94.6 09/04/2014 0600   MCH 31.3 09/04/2014 0600   MCHC 33.1 09/04/2014 0600   RDW 14.0 09/04/2014 0600   LYMPHSABS 1.4 09/04/2014 0600   MONOABS 0.5 09/04/2014 0600   EOSABS 0.2 09/04/2014 0600   BASOSABS 0.0 09/04/2014 0600   CBG's (last 3):   Recent Labs  09/04/14 2107 09/05/14 0733 09/05/14 1158  GLUCAP 156* 105* 112*   LFT's Lab Results  Component Value Date   ALT 5 09/01/2014   AST 9 09/01/2014   ALKPHOS 99 09/01/2014   BILITOT 0.5 09/01/2014    Studies/Results: No results found.  Medications:  I have reviewed the patient's current  medications. Scheduled Medications: . amLODipine  10 mg Oral Daily  . aspirin EC  81 mg Oral Daily  . atenolol  100 mg Oral Daily  . conjugated estrogens  1 Applicatorful Vaginal Q48H  . fenofibrate  54 mg Oral Daily  . imipenem-cilastatin  500 mg Intravenous 3 times per day  . insulin glargine  10 Units Subcutaneous QHS  . levothyroxine  125 mcg Oral QAC breakfast  . lisinopril  40 mg Oral Daily  . pantoprazole  40 mg Oral BID  . polyethylene glycol  17 g Oral Daily  . senna  1 tablet Oral BID  . simvastatin  5 mg Oral q1800  . [START ON 09/07/2014] Vitamin D (Ergocalciferol)  50,000 Units Oral Q7 days   PRN Medications: acetaminophen, alum & mag hydroxide-simeth, bisacodyl, guaiFENesin-dextromethorphan, menthol-cetylpyridinium, methocarbamol, naphazoline-pheniramine, oxyCODONE, phenol, prochlorperazine, prochlorperazine, prochlorperazine, sodium chloride, sodium phosphate, sorbitol, traMADol, traZODone  Assessment/Plan: Active Problems:   Left lumbar radiculopathy   1. Functional deficits secondary to Left Lumbar 3 radiculopathy/stenosis s/p L3-4 laminectomy and fusion  2. DVT Prophylaxis/Anticoagulation: Mechanical: Sequential compression devices, below knee Bilateral lower extremities  3. Pain Management: now on oxycodone prn for pain management.  -gabapentin for radicular pain  -improved control  4. Mood: Team to provide ego support. LCSW to follow for evaluation and support.  5.  Neuropsych: This patient is capable of making decisions on her own behalf.  6. Skin/Wound Care:  Routine pressure relief measures. nutrition  7. DM type 2: . Continue lantus 10 units and and use SSI for elevated BS.  -reasonable control  8. 100k Klebsiella oxytoca (ESBL) on 9/7: continue impenem and contact precautions -urine re-cultured 9/10 - insig growth 9. HTN: Monitor BP every 8 hours. Continue norvasc, Lisinopril and tenormin  10. Constipation: augment bowel regimen with daily miralax.  Supp/enema prn  11. FEN: encourage fluids 12. L eye discomfort - cause not evident on exam - encouraged to use eye drops as per rehab MD (only prn and reports infreq use) - vision intact   Length of stay, days: 5    Casmira A. Felicity Coyer, MD 09/05/2014, 12:30 PM

## 2014-09-05 NOTE — Progress Notes (Signed)
Occupational Therapy Session Note  Patient Details  Name: April [name] MRN: [id number] Date of Birth: May 01, 1925  Today's Date: 09/05/2014 OT Individual Time: 1300-1331 OT Individual Time Calculation (min): 31 min    Short Term Goals: Week 1:  OT Short Term Goal 1 (Week 1): STG=LTG due to anticipated short LOS  Skilled Therapeutic Interventions/Progress Updates:    Patient seen this pm for OT intervention to address functional mobility, stand tolerance, stand balance, and use of adaptive equipment.  Patient able to ambulate to bathroom with min assist with rolling walker, needed cueing for hand placement in sit to stand and stand to sit.   Reviewed use of long handled shoe horn to don shoes.   Therapy Documentation Precautions:  Precautions Precautions: Back;Fall Precaution Booklet Issued: Yes (comment) Precaution Comments: pt able to report 3/3 back precautions.   Required Braces or Orthoses: Spinal Brace Spinal Brace: Applied in sitting position;Lumbar corset Restrictions Weight Bearing Restrictions: No    Pain: 7/10 with activity, 4/10 at rest ADL: ADL ADL Comments: see FIM  See FIM for current functional status  Therapy/Group: Individual Therapy  Collier Salina 09/05/2014, 1:31 PM

## 2014-09-05 NOTE — Progress Notes (Signed)
Physical Therapy Session Note  Patient Details  Name: April [name] MRN: [id number] Date of Birth: 08/18/1925  Today's Date: 09/05/2014 PT Individual Time: 1103-1200 PT Individual Time Calculation (min): 57 min   Short Term Goals: Week 1:  PT Short Term Goal 1 (Week 1): STG=LTG due to ELOS  Skilled Therapeutic Interventions/Progress Updates:  Pt was seen bedside in the am. Pt able to report 3/3 back precautions. Pt propelled w/c x 2 for 150 feet with B UE and LEs with S. Pt performed multiple sit to stand transfers with S to min guard. Performed seated and standing LE exercises for strengthening and flexibility. Pt ambulated with rolling walker 50 feet x 2 with S to min guard.   Therapy Documentation Precautions:  Precautions Precautions: Back;Fall Precaution Booklet Issued: Yes (comment) Precaution Comments: pt able to report 3/3 back precautions.   Required Braces or Orthoses: Spinal Brace Spinal Brace: Applied in sitting position;Lumbar corset Restrictions Weight Bearing Restrictions: No General:  Pain: Pt c/o 7/10 pain L LE and low back.   Mobility:   Locomotion : Ambulation Ambulation/Gait Assistance: 5: Supervision;4: Min guard   See FIM for current functional status  Therapy/Group: Individual Therapy  Rayford Halsted 09/05/2014, 12:51 PM

## 2014-09-05 NOTE — Progress Notes (Signed)
Physical Therapy Session Note  Patient Details  Name: April [name] MRN: [id number] Date of Birth: 22-Oct-1925  Today's Date: 09/05/2014 PT Individual Time: 1402-1430 PT Individual Time Calculation (min): 28 min   Short Term Goals: Week 1:  PT Short Term Goal 1 (Week 1): STG=LTG due to ELOS  Skilled Therapeutic Interventions/Progress Updates:  Pt was seen bedside in the pm. Pt rolled into R side lying via log rolling then transitioned to edge of bed with side rail and S. Pt transferred sit to stand multiple times with rolling walker and S. Pt ambulated x 5 for 50 feet each time with S to min guard and occasional verbal cues. Pt ambulates with decreased step length B and slow cadence. Pt returned to room and left sitting up in w/c with call bell within reach.   Therapy Documentation Precautions:  Precautions Precautions: Back;Fall Precaution Booklet Issued: Yes (comment) Precaution Comments: pt able to report 3/3 back precautions.   Required Braces or Orthoses: Spinal Brace Spinal Brace: Applied in sitting position;Lumbar corset Restrictions Weight Bearing Restrictions: No General:   Pain: Pt c/o mild to mod pain low back and L LE.   Mobility:   Locomotion : Ambulation Ambulation/Gait Assistance: 5: Supervision;4: Min guard   See FIM for current functional status  Therapy/Group: Individual Therapy  Rayford Halsted 09/05/2014, 2:35 PM

## 2014-09-05 NOTE — Progress Notes (Signed)
Occupational Therapy Session Note  Patient Details  Name: April [name] MRN: [id number] Date of Birth: [birth year]  Today's Date: 09/05/2014 OT Individual Time: 4098-1191 OT Individual Time Calculation (min): 55 min    Short Term Goals: Week 1:  OT Short Term Goal 1 (Week 1): STG=LTG due to anticipated short LOS  Skilled Therapeutic Interventions/Progress Updates:    Patient seen this am for OT intervention to address safety and carryover of back precautions during simple self care tasks.  Patient very fearful in standing yet stood with min assist. Patient reliant on upper extremity support in standing due to fear and lower extremity weakness.  Patient able to utilize dressing stick with mod cueing to aide with lower body dressing / undressing.  Patient able to use long handled bath sponge effectively.  Patient requests rest break after each short stand episode.    Therapy Documentation Precautions:  Precautions Precautions: Back;Fall Precaution Booklet Issued: Yes (comment) Precaution Comments: pt able to report 3/3 back precautions.   Required Braces or Orthoses: Spinal Brace Spinal Brace: Applied in sitting position;Lumbar corset Restrictions Weight Bearing Restrictions: No  Pain: Pain Assessment Pain Score: 7  Pain Type: Surgical pain Pain Location: Back Pain Orientation: Mid;Lower Pain Descriptors / Indicators: Aching Pain Onset: On-going Pain Intervention(s): Medication (See eMAR) ADL: ADL ADL Comments: see FIM  See FIM for current functional status  Therapy/Group: Individual Therapy  Collier Salina 09/05/2014, 8:50 AM

## 2014-09-06 ENCOUNTER — Inpatient Hospital Stay (HOSPITAL_COMMUNITY): Payer: Worker's Compensation

## 2014-09-06 ENCOUNTER — Inpatient Hospital Stay (HOSPITAL_COMMUNITY): Payer: Worker's Compensation | Admitting: *Deleted

## 2014-09-06 LAB — GLUCOSE, CAPILLARY
GLUCOSE-CAPILLARY: 103 mg/dL — AB (ref 70–99)
GLUCOSE-CAPILLARY: 131 mg/dL — AB (ref 70–99)
GLUCOSE-CAPILLARY: 171 mg/dL — AB (ref 70–99)
Glucose-Capillary: 147 mg/dL — ABNORMAL HIGH (ref 70–99)
Glucose-Capillary: 205 mg/dL — ABNORMAL HIGH (ref 70–99)

## 2014-09-06 NOTE — Progress Notes (Signed)
Physical Therapy Session Note  Patient Details  Name: April [name]: [id number] Date of Birth: August 05, 1925  Today's Date: 09/06/2014 PT Individual Time: 1255-1355 PT Individual Time Calculation (min): 60 min   Short Term Goals: Week 1:  PT Short Term Goal 1 (Week 1): STG=LTG due to ELOS  Skilled Therapeutic Interventions/Progress Updates:  Patient sitting in wheelchair upon entering room without corset on. Patient assisted with donning corset. Patient reported pain in back at 7 and can't get any pain meds until after 2:00. Patient willing to participate in therapy. Patient propelled wheelchair 150 feet using bilateral UE's. Patient sit to stand and ambulated 32 feet x 2 with RW with min steady assist. Patient reported pain as the reason for not being able to go further. Patient attempted otago exercises for balance but unable to complete those requiring unilateral stance on left LE due to pain. Exercises modified as patient performed the following for strengthening: LAQ's, knee flexion with resistive theraband, toe raises in standing and ambulating up and down 3 steps x 2 with bilateral rails and min assist. Patient exercised on kinetron for 1 minute x 3 for LE strengthening. Patient ambulated with RW 45 feet and min assist back towards room. PT checked with nursing and patient able to receive pain meds now. Patient reported she did not feel up to her next PT session immediately following.   Therapy Documentation Precautions:  Precautions Precautions: Back;Fall Precaution Booklet Issued: Yes (comment) Precaution Comments: pt able to report 3/3 back precautions.   Required Braces or Orthoses: Spinal Brace Spinal Brace: Applied in sitting position;Lumbar corset Restrictions Weight Bearing Restrictions: No  Pain: Pain Assessment Pain Assessment: 0-10 Pain Score: 7  Faces Pain Scale: Hurts a little bit Pain Type: Surgical pain Pain Location: Back Pain Orientation: Lower Pain  Descriptors / Indicators: Throbbing Pain Onset: With Activity Pain Intervention(s): Medication (See eMAR) PAINAD (Pain Assessment in Advanced Dementia) Breathing: normal  Locomotion : Ambulation Ambulation/Gait Assistance: 4: Min guard Wheelchair Mobility Distance: 150   See FIM for current functional status  Therapy/Group: Individual Therapy  Arelia Longest M 09/06/2014, 3:12 PM

## 2014-09-06 NOTE — Progress Notes (Signed)
April [name] is a 80 y.o. female 1925/07/11 [id number]  Subjective: Continued but improved pressure sensation in L eye, vision intact. no new problems. Slept well. Feeling well.  Objective: Vital signs in last 24 hours: Temp:  [98.1 F (36.7 C)-98.6 F (37 C)] 98.4 F (36.9 C) (09/13 0544) Pulse Rate:  [65-72] 65 (09/13 0544) Resp:  [17-18] 18 (09/13 0544) BP: (148-167)/(56-67) 167/67 mmHg (09/13 0544) SpO2:  [96 %-98 %] 96 % (09/13 0544) Weight change:  Last BM Date: 09/06/14  Intake/Output from previous day: 09/12 0701 - 09/13 0700 In: 840 [P.O.:840] Out: -   Physical Exam General: No apparent distress  L eye with scant watery clear DC, no erythema or conjunctivitis  Lungs: Normal effort. Lungs clear to auscultation, no crackles or wheezes. Cardiovascular: Regular rate and rhythm, no edema Wounds:  Clean, dry, intact. No signs of infection.  Lab Results: BMET    Component Value Date/Time   NA 139 09/04/2014 0600   K 3.9 09/04/2014 0600   CL 103 09/04/2014 0600   CO2 24 09/04/2014 0600   GLUCOSE 115* 09/04/2014 0600   BUN 17 09/04/2014 0600   CREATININE 0.88 09/04/2014 0600   CALCIUM 8.6 09/04/2014 0600   GFRNONAA 58* 09/04/2014 0600   GFRAA 67* 09/04/2014 0600   CBC    Component Value Date/Time   WBC 6.3 09/04/2014 0600   RBC 2.94* 09/04/2014 0600   HGB 9.2* 09/04/2014 0600   HCT 27.8* 09/04/2014 0600   PLT 270 09/04/2014 0600   MCV 94.6 09/04/2014 0600   MCH 31.3 09/04/2014 0600   MCHC 33.1 09/04/2014 0600   RDW 14.0 09/04/2014 0600   LYMPHSABS 1.4 09/04/2014 0600   MONOABS 0.5 09/04/2014 0600   EOSABS 0.2 09/04/2014 0600   BASOSABS 0.0 09/04/2014 0600   CBG's (last 3):    Recent Labs  09/05/14 1158 09/05/14 1647 09/05/14 2151  GLUCAP 112* 140* 147*   LFT's Lab Results  Component Value Date   ALT 5 09/01/2014   AST 9 09/01/2014   ALKPHOS 99 09/01/2014   BILITOT 0.5 09/01/2014    Studies/Results: No results found.  Medications:  I have reviewed the  patient's current medications. Scheduled Medications: . amLODipine  10 mg Oral Daily  . aspirin EC  81 mg Oral Daily  . atenolol  100 mg Oral Daily  . conjugated estrogens  1 Applicatorful Vaginal Q48H  . fenofibrate  54 mg Oral Daily  . imipenem-cilastatin  500 mg Intravenous 3 times per day  . insulin glargine  10 Units Subcutaneous QHS  . levothyroxine  125 mcg Oral QAC breakfast  . lisinopril  40 mg Oral Daily  . pantoprazole  40 mg Oral BID  . polyethylene glycol  17 g Oral Daily  . senna  1 tablet Oral BID  . simvastatin  5 mg Oral q1800  . [START ON 09/07/2014] Vitamin D (Ergocalciferol)  50,000 Units Oral Q7 days   PRN Medications: acetaminophen, alum & mag hydroxide-simeth, bisacodyl, guaiFENesin-dextromethorphan, menthol-cetylpyridinium, methocarbamol, naphazoline-pheniramine, oxyCODONE, phenol, prochlorperazine, prochlorperazine, prochlorperazine, sodium chloride, sodium phosphate, sorbitol, traMADol, traZODone  Assessment/Plan: Active Problems:   Left lumbar radiculopathy   1. Functional deficits secondary to Left Lumbar 3 radiculopathy/stenosis s/p L3-4 laminectomy and fusion  2. DVT Prophylaxis/Anticoagulation: Mechanical: Sequential compression devices, below knee Bilateral lower extremities  3. Pain Management: now on oxycodone prn for pain management.  -gabapentin for radicular pain  -improved control  4. Mood: Team to provide ego support. LCSW to follow for evaluation and  support.  5. Neuropsych: This patient is capable of making decisions on her own behalf.  6. Skin/Wound Care:  Routine pressure relief measures. nutrition  7. DM type 2: . Continue lantus 10 units and and use SSI for elevated BS.  -reasonable control  8. 100k Klebsiella oxytoca (ESBL) on 9/7: continue impenem and contact precautions -urine re-cultured 9/10 - insig growth 9. HTN: Monitor BP every 8 hours. Continue norvasc, Lisinopril and tenormin  10. Constipation: augment bowel regimen with  daily miralax. Supp/enema prn  11. FEN: encourage fluids 12. L eye discomfort, improved per pt report - cause not evident on exam - encouraged to use eye drops as per rehab MD (only prn and reports infreq use) - vision intact   Length of stay, days: 6    [name], MD 09/06/2014, 10:31 AM

## 2014-09-07 ENCOUNTER — Inpatient Hospital Stay (HOSPITAL_COMMUNITY): Payer: Worker's Compensation

## 2014-09-07 ENCOUNTER — Inpatient Hospital Stay (HOSPITAL_COMMUNITY): Payer: Worker's Compensation | Admitting: Rehabilitation

## 2014-09-07 DIAGNOSIS — E1142 Type 2 diabetes mellitus with diabetic polyneuropathy: Secondary | ICD-10-CM

## 2014-09-07 DIAGNOSIS — M48061 Spinal stenosis, lumbar region without neurogenic claudication: Secondary | ICD-10-CM

## 2014-09-07 DIAGNOSIS — E1149 Type 2 diabetes mellitus with other diabetic neurological complication: Secondary | ICD-10-CM

## 2014-09-07 DIAGNOSIS — Z5189 Encounter for other specified aftercare: Secondary | ICD-10-CM

## 2014-09-07 DIAGNOSIS — IMO0002 Reserved for concepts with insufficient information to code with codable children: Secondary | ICD-10-CM | POA: Diagnosis not present

## 2014-09-07 LAB — GLUCOSE, CAPILLARY
GLUCOSE-CAPILLARY: 106 mg/dL — AB (ref 70–99)
Glucose-Capillary: 110 mg/dL — ABNORMAL HIGH (ref 70–99)
Glucose-Capillary: 185 mg/dL — ABNORMAL HIGH (ref 70–99)
Glucose-Capillary: 181 mg/dL — ABNORMAL HIGH (ref 70–99)

## 2014-09-07 NOTE — Progress Notes (Signed)
PHYSICAL MEDICINE & REHABILITATION     PROGRESS NOTE    Subjective/Complaints: Left eye better. Left leg, back hurts still, but she works through pain  Objective: Vital Signs: Blood pressure 155/65, pulse 66, temperature 98.5 F (36.9 C), temperature source Oral, resp. rate 18, height  (1.753 m), weight 74 kg (163 lb 2.3 oz), SpO2 98.00%. No results found. No results found for this basename: WBC, HGB, HCT, PLT,  in the last 72 hours No results found for this basename: NA, K, CL, CO, GLUCOSE, BUN, CREATININE, CALCIUM,  in the last 72 hours CBG (last 3)   Recent Labs  09/06/14 1634 09/06/14 2057 09/07/14 0705  GLUCAP 171* 205* 110*    Wt Readings from Last 3 Encounters:  09/02/14 74 kg (163 lb 2.3 oz)  08/25/14 86.4 kg (190 lb 7.6 oz)  08/25/14 86.4 kg (190 lb 7.6 oz)    Physical Exam:  Nursing note and vitals reviewed.  Constitutional: She is oriented to person, place, and time. She appears well-developed and well-nourished.  HENT: oral mucosa pink and moist  Head: Normocephalic and atraumatic.  Eyes: Conjunctivae are normal. Pupils are equal, round, and reactive to light. (still no redness OS) Neck: Normal range of motion. Neck supple.  Cardiovascular: Normal rate and regular rhythm. No murmurs or rubs.  Respiratory: Effort normal and breath sounds normal. No respiratory distress. She has no wheezes. No rales  GI: Soft. Bowel sounds are normal. She exhibits no distension. There is no tenderness.  Musculoskeletal: She exhibits no edema.  Back pain with SLR BLE. Left hip pain with ROM at hip.  Neurological: She is alert and oriented to person, place, and time. No cranial nerve deficit. Coordination normal.  Good sitting balance. Still some weakness (4-) in left quads, hip adductors. HF are 4- as well (pain). ADF/APF 5/5. UE's grossly 5/5 prox to distal. Pain in L3 distribution, left > right legs. No focal sensory loss is appreciated however except for mild  stocking glove sensory loss in the feet (to ankles). Cognitively displays normal insight, awareness, memory.  Skin: Skin is warm and dry.  Psychiatric: She has a normal mood and affect. Her behavior is normal. Thought content normal      Assessment/Plan: 1. Functional deficits secondary to Left L3 lumbar radiculopathy/stenosis s/p L3-4 laminectomy and fusion which require 3+ hours per day of interdisciplinary therapy in a comprehensive inpatient rehab setting. Physiatrist is providing close team supervision and 24 hour management of active medical problems listed below. Physiatrist and rehab team continue to assess barriers to discharge/monitor patient progress toward functional and medical goals. FIM: FIM - Bathing Bathing Steps Patient Completed: Chest;Right Arm;Left Arm;Abdomen;Front perineal area;Buttocks;Right upper leg;Left upper leg;Right lower leg (including foot);Left lower leg (including foot) Bathing: 4: Steadying assist  FIM - Upper Body Dressing/Undressing Upper body dressing/undressing steps patient completed: Thread/unthread right sleeve of pullover shirt/dresss;Thread/unthread left sleeve of pullover shirt/dress;Put head through opening of pull over shirt/dress;Pull shirt over trunk Upper body dressing/undressing: 5: Supervision: Safety issues/verbal cues FIM - Lower Body Dressing/Undressing Lower body dressing/undressing steps patient completed: Pull pants up/down Lower body dressing/undressing: 0: Activity did not occur  FIM - Toileting Toileting steps completed by patient: Adjust clothing prior to toileting;Performs perineal hygiene;Adjust clothing after toileting Toileting Assistive Devices: Grab bar or rail for support Toileting: 4: Steadying assist  FIM - Diplomatic Services operational officer Devices: Best boy Transfers: 5-To toilet/BSC: Supervision (verbal cues/safety issues);6-From toilet/BSC  FIM - Press photographer  Assistive Devices: Environmental consultant;Arm rests;Orthosis Bed/Chair Transfer: 4: Bed > Chair or W/C: Min A (steadying Pt. > 75%);4: Chair or W/C > Bed: Min A (steadying Pt. > 75%)  FIM - Locomotion: Wheelchair Distance: 150 Locomotion: Wheelchair: 5: Travels 150 ft or more: maneuvers on rugs and over door sills with supervision, cueing or coaxing FIM - Locomotion: Ambulation Locomotion: Ambulation Assistive Devices: Orthosis;Walker - Rolling Ambulation/Gait Assistance: 4: Min guard Locomotion: Ambulation: 1: Travels less than 50 ft with minimal assistance (Pt.>75%)  Comprehension Comprehension Mode: Auditory Comprehension: 5-Follows basic conversation/direction: With extra time/assistive device  Expression Expression Mode: Verbal Expression: 5-Expresses complex 90% of the time/cues < 10% of the time  Social Interaction Social Interaction: 5-Interacts appropriately 90% of the time - Needs monitoring or encouragement for participation or interaction.  Problem Solving Problem Solving: 3-Solves basic 50 - 74% of the time/requires cueing 25 - 49% of the time  Memory Memory: 3-Recognizes or recalls 50 - 74% of the time/requires cueing 25 - 49% of the time Medical Problem List and Plan:  1. Functional deficits secondary to Left Lumbar 3 radiculopathy/stenosis s/p L3-4 laminectomy and fusion  2. DVT Prophylaxis/Anticoagulation: Mechanical: Sequential compression devices, below knee Bilateral lower extremities  3. Pain Management: now on oxycodone prn for pain management.   -gabapentin for radicular pain   -improved control overall 4. Mood: Team to provide ego support. LCSW to follow for evaluation and support.  5. Neuropsych: This patient is capable of making decisions on her own behalf.  6. Skin/Wound Care:   - Routine pressure relief measures.   -nutrition 7. DM type 2:  . Continue lantus 10 units and and use SSI for elevated BS.   -reasonable control  8. 100k Klebsiella oxytoca: impenem     -urine re-cultured with insignificant growth---dc abx 9. HTN: Monitor BP every 8 hours. Continue norvasc, Lisinopril and tenormin  10. Constipation: augment bowel regimen with daily miralax. Supp/enema prn 11. FEN: encourage fluids 12. Left eye discomfort----improved?  -visine drops prn  LOS (Days) 7 A FACE TO FACE EVALUATION WAS PERFORMED  Xyon Lukasik T 09/07/2014 8:16 AM

## 2014-09-07 NOTE — Plan of Care (Signed)
Problem: RH Bed Mobility Goal: LTG Patient will perform bed mobility with assist (PT) LTG: Patient will perform bed mobility with assistance, with/without cues (PT).  Outcome: Not Met (add Reason) Pt requires Supervision and occasional MinA to manage LE into bed or move R sidelying to seated EOB.  Problem: RH Floor Transfers Goal: LTG Patient will perform floor transfers w/assist (PT) LTG: Patient will perform floor transfers with assistance (PT).  Outcome: Not Met (add Reason) Unable to assess floor transfers due to pain, anxiety with movement  Problem: RH Ambulation Goal: LTG Patient will ambulate in controlled environment (PT) LTG: Patient will ambulate in a controlled environment, # of feet with assistance (PT).  Outcome: Not Met (add Reason) Pt able to walk up to 100', limited from further distances by decreased endurance  Problem: RH Stairs Goal: LTG Patient will ambulate up and down stairs w/assist (PT) LTG: Patient will ambulate up and down # of stairs with assistance (PT)  Outcome: Not Met (add Reason) Pt requires MinA to MinGuard for stair negotiation including HHA on L, pt unable to reach both rails at home entrance.

## 2014-09-07 NOTE — Progress Notes (Signed)
Physical Therapy Discharge Summary  Patient Details  Name: April [name] MRN: [id number] Date of Birth: 1925/08/21  Today's Date: 09/07/2014 PT Individual Time: 0901-1001 PT Individual Time Calculation (min): 60 min  Session 2 Time: 2979-8921 Time Calculation (min): 30 min  Patient has met 6 of 10 long term goals due to improved activity tolerance, improved balance, improved postural control and increased strength.  Patient to discharge at a wheelchair/ambulatory level Supervision.   Patient's care partner is independent to provide the necessary physical assistance at discharge.  Reasons goals not met: Ambulation distance limited by decreased endurance  Recommendation:  Patient will benefit from ongoing skilled PT services in home health setting to continue to advance safe functional mobility, address ongoing impairments in standing balance, endurance, pain, strength in BLE, and minimize fall risk.  Equipment: No equipment provided, pt previously owned equipment  Reasons for discharge: treatment goals met and discharge from hospital  Patient/family agrees with progress made and goals achieved: Yes  Skilled Therapeutic Interventions: Session 1: Session focused on bed mobility, w/c propulsion, stairs. Lots of supine<>sit w/ SBA to MinA, depending on fatigue and pain. Propelled w/c 150' to rehab gym, ambulated 74' then 150' w/ 1 standing rest break w/ RW and close S. Discussion of discharge disposition and mobility around home. Pt left seated in w/c w/ all needs within reach.  Session 2: Family training w/ dtr. Daughter demonstrated ability to safely assist pt w/ supine<>sit, stair negotiation, and transfers bed<>w/c.   PT Discharge Precautions/Restrictions Precautions Precautions: Back;Fall Precaution Booklet Issued: Yes (comment) Precaution Comments: pt able to report 3/3 back precautions.   Required Braces or Orthoses: Spinal Brace Spinal Brace: Applied in sitting  position;Lumbar corset Restrictions Weight Bearing Restrictions: No Vital Signs Therapy Vitals Temp: 98.9 F (37.2 C) Temp src: Oral Pulse Rate: 63 Resp: 18 BP: 117/54 mmHg Patient Position (if appropriate): Sitting Oxygen Therapy SpO2: 98 % O2 Device: None (Room air) Pain Pain Assessment Pain Assessment: 0-10 Pain Score: 7  Pain Type: Surgical pain Pain Location: Back Pain Orientation: Lower Pain Descriptors / Indicators: Aching Pain Onset: With Activity Patients Stated Pain Goal: 3 Pain Intervention(s): Medication (See eMAR) Multiple Pain Sites: No Vision/Perception  Vision - Assessment Additional Comments: plans to see optometrist s/o discharge Perception Comments: WFL  Cognition Overall Cognitive Status: Within Functional Limits for tasks assessed Arousal/Alertness: Awake/alert Orientation Level: Oriented X4 Attention: Selective;Alternating Alternating Attention: Impaired Alternating Attention Impairment: Functional complex (may break back precautions if distracted) Memory: Appears intact Awareness: Appears intact Problem Solving: Impaired Problem Solving Impairment: Functional complex Safety/Judgment: Impaired Comments: stood without spinal brace, knowingly breaking precaution, when therapist was occupied with discussion with staff. Sensation Sensation Light Touch: Appears Intact Stereognosis: Not tested Hot/Cold: Not tested Proprioception: Appears Intact Additional Comments: BLEs intact except decreased sensation in B big toe L5 dermatome Coordination Gross Motor Movements are Fluid and Coordinated: Yes Fine Motor Movements are Fluid and Coordinated: Yes Motor  Motor Motor: Within Functional Limits  Mobility Bed Mobility Bed Mobility: Supine to Sit;Sit to Supine Supine to Sit: 5: Supervision Sit to Supine: 5: Supervision Transfers Transfers: Yes Sit to Stand: 5: Supervision Sit to Stand Details: Verbal cues for precautions/safety Stand to Sit:  5: Supervision Stand to Sit Details (indicate cue type and reason): Verbal cues for precautions/safety Stand Pivot Transfers: 5: Supervision Stand Pivot Transfer Details: Verbal cues for precautions/safety Locomotion  Ambulation Ambulation: Yes Ambulation/Gait Assistance: 5: Supervision Ambulation Distance (Feet): 75 Feet Assistive device: Rolling walker Ambulation/Gait Assistance Details: Verbal cues for  precautions/safety;Verbal cues for safe use of DME/AE Gait Gait: Yes Gait Pattern: Decreased step length - right Gait velocity: decreased Stairs / Additional Locomotion Stairs: Yes Stairs Assistance: 4: Min guard Stairs Assistance Details (indicate cue type and reason): HHA on L hand, pt unable to reach both rails at home entrance Stair Management Technique: One rail Right (HHA on L) Number of Stairs: 9 Height of Stairs: 5 Wheelchair Mobility Wheelchair Mobility: Yes Wheelchair Assistance: 6: Modified independent (Device/Increase time) Environmental health practitioner: Both upper extremities Wheelchair Parts Management: Supervision/cueing Distance: 300  Trunk/Postural Assessment  Cervical Assessment Cervical Assessment: Within Functional Limits Thoracic Assessment Thoracic Assessment: Within Functional Limits Lumbar Assessment Lumbar Assessment:  (Limited due to precautions, pain) Postural Control Postural Control: Within Functional Limits  Balance   Extremity Assessment  RUE Assessment RUE Assessment: Exceptions to [facility] RUE Strength RUE Overall Strength Comments: Overall 4/5 LUE Assessment LUE Assessment: Exceptions to [facility] LUE Strength LUE Overall Strength Comments: Overall 3+/5 RLE Assessment RLE Assessment: Exceptions to [facility] RLE Strength RLE Overall Strength Comments: continues to have overall 4+/5, except for hip flex 3+/5 and painful LLE Assessment LLE Assessment: Exceptions to [facility] LLE Strength LLE Overall Strength Comments: overall 4+/5 except for hip flex  3+/5  See FIM for current functional status  Rada Hay Rada Hay, PT, DPT 09/07/2014, 4:35 PM

## 2014-09-07 NOTE — Progress Notes (Signed)
Physical Therapy Session Note  Patient Details  Name: April [name] MRN: [id number] Date of Birth: 12-16-1925  Today's Date: 09/07/2014 PT Individual Time: 4098-1191 PT Individual Time Calculation (min): 45 min   Short Term Goals: Week 1:  PT Short Term Goal 1 (Week 1): STG=LTG due to ELOS  Skilled Therapeutic Interventions/Progress Updates:   Pt received sitting in w/c in room, agreeable to therapy session, but stating fatigue from previous sessions and increased pain.  She did state that she had gotten pain medicine just prior to session.  Skilled session focused on w/c mobility in controlled, home and busy (to simulate community) environment >300' with BUEs.  Pt requires intermittent rest breaks due to fatigue but able to complete at S level with min cues in home environment for technique.  Assessed strength, sensation, etc (see details in D/C summary).  Performed seated kinetron x 3 reps of 2 mins at heavy resistance.  Rest breaks in between activity.  Ended session with two reps of gait x 30' and another 21' at S level with RW.  Min cues for upright/relaxed posture, increased stride length and wider steps.  Pt tolerated well and propelled back to room.  Left in w/c in room with all needs in reach.  Discussed difficulties with getting in and out of bed and pt states that she has practiced many times and feels comfortable with this task.    Therapy Documentation Precautions:  Precautions Precautions: Back;Fall Precaution Booklet Issued: Yes (comment) Precaution Comments: pt able to report 3/3 back precautions.   Required Braces or Orthoses: Spinal Brace Spinal Brace: Applied in sitting position;Lumbar corset Restrictions Weight Bearing Restrictions: No   Vital Signs: Therapy Vitals Temp: 98.9 F (37.2 C) Temp src: Oral Pulse Rate: 63 Resp: 18 BP: 117/54 mmHg Patient Position (if appropriate): Sitting Oxygen Therapy SpO2: 98 % O2 Device: None (Room air) Pain: Pain  Assessment Pain Assessment: 0-10 Pain Score: 7  Pain Type: Surgical pain Pain Location: Back Pain Orientation: Lower Pain Descriptors / Indicators: Aching Pain Onset: With Activity Patients Stated Pain Goal: 3 Pain Intervention(s): Medication (See eMAR) Multiple Pain Sites: No Locomotion : Ambulation Ambulation/Gait Assistance: 5: Supervision Wheelchair Mobility Distance: 300    See FIM for current functional status  Therapy/Group: Individual Therapy  Vista Deck 09/07/2014, 4:07 PM

## 2014-09-07 NOTE — Progress Notes (Addendum)
Occupational Therapy Session and Discharge Summary  Patient Details  Name: April [name] MRN: [id number] Date of Birth: 1925-03-24  Today's Date: 09/07/2014 OT Individual Time: 1000-1045 OT Individual Time Calculation (min): 45 min   Skilled Intervention: ADL-retraining with emphasis on reinforcement of back precautions, safety awareness and lower body dressing using AE.   Pt received in w/c with physical therapist at end of their session.   Pt aware of d/c and receptive for final treatment to review goals/objectives and demonstrate competence with use of AE.   Pt ambulated from w/c to toilet to simulate toileting (all steps) and progressed to seated bathing.   Pt is able to pull down her pants, toilet, and complete peri-area cleansing after voiding urine but she is unable to reach her buttocks for hygeine s/p BM or pull up her pants while standing or using lateral leans d/t poor coordination with management of garments and toilet paper with either hand.   During bathing, while therapist was engaged in a dw Hotel manager, pt stood to wash her buttocks despite prior training emphasizing use of spinal brace while standing.   Pt acknowledged that she knew precaution but disregarded it this one occasion d/t need for more thorough cleaning.  Pt dressed upper body and donned brace after shower, still seated on shower chair.   Pt completed lower body dressing in w/c at sink and required min assist to pull up pants and don shoes with use of AE.   Pt left at sink at end of session attending to additional grooming needs with call light and phone placed within reach.                                                                                                                                       Discharge Summary  Patient has met 7 of 8 long term goals due to improved activity tolerance and ability to compensate for deficits.  Patient to discharge at [facility] Assist level.  Patient's care partner  is independent to provide the necessary physical and cognitive assistance at discharge to assist with lower body bathing/dressing (lower legs and feet) and to provide cues and reminders to maintain back precautions during BADL.    Reasons goals not met: Partially met with toilet hygiene, consistent when voiding urine, frontal approach but inconsistent with thoroughness using posterior approach following BM.  Recommendation:  Patient will benefit from ongoing skilled OT services in home health setting to continue to advance functional skills in the area of BADL.  Equipment: No equipment provided  Reasons for discharge: discharge from hospital  Patient/family agrees with progress made and goals achieved: Yes  OT Discharge Precautions/Restrictions  Precautions Precautions: Back;Fall Precaution Booklet Issued: Yes (comment) Precaution Comments: pt able to report 3/3 back precautions.   Required Braces or Orthoses: Spinal Brace Spinal Brace: Applied in sitting position;Lumbar corset Restrictions Weight Bearing Restrictions: No  Pain  Pain Assessment Pain Assessment: 0-10 Pain Score: 3   ADL ADL ADL Comments: see FIM  Vision/Perception  Vision- History Baseline Vision/History: Wears glasses Wears Glasses: Reading only;Distance only Patient Visual Report: Blurring of vision Vision- Assessment Vision Assessment?: No apparent visual deficits Additional Comments: plans to see optometrist s/o discharge Perception Comments: WFL   Cognition Overall Cognitive Status: Within Functional Limits for tasks assessed Arousal/Alertness: Awake/alert Orientation Level: Oriented X4 Attention: Selective;Alternating Alternating Attention: Impaired Alternating Attention Impairment: Functional complex (may break back precautions if distracted) Memory: Appears intact Awareness: Appears intact Problem Solving: Impaired Problem Solving Impairment: Functional complex Safety/Judgment:  Impaired Comments: stood without spinal brace, knowingly breaking precaution, when therapist was occupied with discussion with staff.  Sensation Sensation Light Touch: Appears Intact Stereognosis: Appears Intact Hot/Cold: Appears Intact Proprioception: Appears Intact Additional Comments: @ BUE Coordination Gross Motor Movements are Fluid and Coordinated: Yes Fine Motor Movements are Fluid and Coordinated: Yes  Motor  Motor Motor: Within Functional Limits  Mobility  Transfers Transfers: Sit to Stand;Stand to Sit Sit to Stand: 5: Supervision Sit to Stand Details: Verbal cues for precautions/safety Stand to Sit: 5: Supervision Stand to Sit Details (indicate cue type and reason): Verbal cues for precautions/safety   Trunk/Postural Assessment  Cervical Assessment Cervical Assessment: Within Functional Limits Thoracic Assessment Thoracic Assessment: Within Functional Limits Lumbar Assessment Lumbar Assessment: Exceptions to [facility] (post-op limitation) Postural Control Postural Control: Within Functional Limits   Balance    Extremity/Trunk Assessment RUE Assessment RUE Assessment: Exceptions to [facility] RUE Strength RUE Overall Strength Comments: Overall 4/5 LUE Assessment LUE Assessment: Exceptions to [facility] LUE Strength LUE Overall Strength Comments: Overall 3+/5  See FIM for current functional status  [place] 09/07/2014, 2:54 PM

## 2014-09-08 DIAGNOSIS — IMO0002 Reserved for concepts with insufficient information to code with codable children: Secondary | ICD-10-CM

## 2014-09-08 DIAGNOSIS — M48061 Spinal stenosis, lumbar region without neurogenic claudication: Secondary | ICD-10-CM

## 2014-09-08 DIAGNOSIS — E1142 Type 2 diabetes mellitus with diabetic polyneuropathy: Secondary | ICD-10-CM

## 2014-09-08 DIAGNOSIS — E1149 Type 2 diabetes mellitus with other diabetic neurological complication: Secondary | ICD-10-CM

## 2014-09-08 DIAGNOSIS — Z5189 Encounter for other specified aftercare: Secondary | ICD-10-CM

## 2014-09-08 MED ORDER — PANTOPRAZOLE SODIUM 40 MG PO TBEC: 40.0000 mg | DELAYED_RELEASE_TABLET | Freq: Two times a day (BID) | ORAL | Status: DC

## 2014-09-08 MED ORDER — ASPIRIN EC 81 MG PO TBEC: 81.0000 mg | DELAYED_RELEASE_TABLET | Freq: Every day | ORAL | Status: DC

## 2014-09-08 MED ORDER — FENOFIBRATE 54 MG PO TABS: 54.0000 mg | ORAL_TABLET | Freq: Every day | ORAL | Status: DC

## 2014-09-08 MED ORDER — FENOFIBRATE 54 MG PO TABS: 54.0000 mg | ORAL_TABLET | Freq: Every day | ORAL | Status: AC

## 2014-09-08 MED ORDER — POLYETHYLENE GLYCOL 3350 17 G PO PACK: 17.0000 g | PACK | Freq: Every day | ORAL | Status: AC

## 2014-09-08 MED ORDER — SENNA 8.6 MG PO TABS: 1.0000 | ORAL_TABLET | Freq: Two times a day (BID) | ORAL | Status: DC

## 2014-09-08 MED ORDER — OXYCODONE HCL 10 MG PO TABS: 10.0000 mg | ORAL_TABLET | Freq: Four times a day (QID) | ORAL | Status: DC | PRN

## 2014-09-08 MED ORDER — AMLODIPINE BESYLATE 10 MG PO TABS: 10.0000 mg | ORAL_TABLET | Freq: Every day | ORAL | Status: DC

## 2014-09-08 MED ORDER — POLYETHYLENE GLYCOL 3350 17 G PO PACK: 17.0000 g | PACK | Freq: Every day | ORAL | Status: DC

## 2014-09-08 MED ORDER — METHOCARBAMOL 500 MG PO TABS: 500.0000 mg | ORAL_TABLET | Freq: Four times a day (QID) | ORAL | Status: DC | PRN

## 2014-09-08 NOTE — Discharge Summary (Signed)
Physician Discharge Summary  Patient ID: April [name] MRN: [id number] DOB/AGE: 78-May-1926 78 y.o.  Admit date: 08/31/2014 Discharge date: 09/08/2014  Discharge Diagnoses:  Principal Problem:   Left lumbar radiculopathy Active Problems:   HTN (hypertension)   DM (diabetes mellitus)   UTI (lower urinary tract infection)   Dehydration   Discharged Condition: Stable.   Labs:  Basic Metabolic Panel:  Recent Labs Lab 09/04/14 0600  NA 139  K 3.9  CL 103  CO2 24  GLUCOSE 115*  BUN 17  CREATININE 0.88  CALCIUM 8.6    CBC:  Recent Labs Lab 09/04/14 0600  WBC 6.3  NEUTROABS 4.3  HGB 9.2*  HCT 27.8*  MCV 94.6  PLT 270    CBG:  Recent Labs Lab 09/06/14 2057 09/07/14 0705 09/07/14 1216 09/07/14 1652 09/07/14 2145  GLUCAP 205* 110* 106* 185* 181*    Brief HPI:   April [name] is a 80 y.o. female with h/o HTN, DM type 2, who has had progressive worsening back and bilateral leg pain in L4 nerve root pattern. Workup revealed severe spinal stenosis with virtually complete CSF block at L3-4 with a virtually autofused L5-S1 and patient elected to undergo decompressive lam L3/4 with fusion with repair of inadvertent dural tear by Dr. Saintclair Halsted on 08/24/14. Post op placed on bedrest till 08/26/14. She has problems with urinary retention, LE weakness as well as pain affecting mobiliy.  UA done due to frequency and shows evidence of pyuria. Therapy ongoing and CIR was recommended for follow up therapy.    Hospital Course: April [name] was admitted to rehab 08/31/2014 for inpatient therapies to consist of PT and OT at least three hours five days a week. Past admission physiatrist, therapy team and rehab RN have worked together to provide customized collaborative inpatient rehab. Blood pressures have been reasonably controlled. Diabetes was monitored with ac/hs checks and blood sugars are reasonably controlled and she is to follow up with PMD for further adjustment in  lantus. Po intake has been good.  Back incision has healed well without signs or symptoms of infection. Reactive leucocytosis has resolved and H/H is stable.  Pain control has been an issue and gabapentin was added with improved control. She was maintained on keflex till sensitivities showed Klebsiella oxytoca in urine. She was started on  Imipenem for treatment but repeat culture showed insignificant growth therefore this was discontinued. She was started on Miralax and  senna was added to help with narcotic induced constipation. Endurance and mobility has slowly improved and patient was at supervision to min assist at discharge. She will continue to receive HHPT and HHOT by [facility] past discharge.    Rehab course: During patient's stay in rehab weekly team conferences were held to monitor patient's progress, set goals and discuss barriers to discharge. Patient has had improvement in activity tolerance, improved balance, improved postural control and increased strength.  She requires steady assist for bathing tasks and supervision with upper body dressing. She requires standby to min assist for bed mobility depending on fatigue as well as min assist for transfers and stair navigation. She is able to ambulate 200 feet with rest break  RW at close supervision level. Ambulation goals not met due to decreased endurance. Family education was done with daughter on all aspects of care.     Disposition:   Home  Diet: Diabetic   Special Instructions: 1. No bending, twisting, arching or lifting items over 5 lbs. 2. Check blood  sugars 2-3 times a day and record.    Medication List    STOP taking these medications       docusate sodium 100 MG capsule  Commonly known as:  COLACE     gemfibrozil 600 MG tablet  Commonly known as:  LOPID     HYDROcodone-acetaminophen 5-325 MG per tablet  Commonly known as:  NORCO/VICODIN     mupirocin ointment 2 %  Commonly known as:  BACTROBAN      TAKE  these medications       acidophilus Caps capsule  Take 1 capsule by mouth daily.     alendronate 70 MG tablet  Commonly known as:  FOSAMAX  Take 70 mg by mouth once a week. Take with a full glass of water on an empty stomach.     amLODipine 10 MG tablet  Commonly known as:  NORVASC  Take 1 tablet (10 mg total) by mouth daily.     aspirin EC 81 MG tablet  Take 1 tablet (81 mg total) by mouth daily.     atenolol 100 MG tablet  Commonly known as:  TENORMIN  Take 1 tablet (100 mg total) by mouth daily.     conjugated estrogens vaginal cream  Commonly known as:  PREMARIN  Place 1 Applicatorful vaginally every other day. Uses Monday, Wednesday, and Friday     CRANBERRY PO  Take 1 tablet by mouth daily.     fenofibrate 54 MG tablet  Take 1 tablet (54 mg total) by mouth daily.     insulin glargine 100 UNIT/ML injection  Commonly known as:  LANTUS  Inject 10 Units into the skin at bedtime.     levothyroxine 125 MCG tablet  Commonly known as:  SYNTHROID, LEVOTHROID  Take 1 tablet (125 mcg total) by mouth daily before breakfast.     lisinopril 40 MG tablet  Commonly known as:  PRINIVIL,ZESTRIL  Take 1 tablet (40 mg total) by mouth daily.     lovastatin 40 MG tablet  Commonly known as:  MEVACOR  Take 40 mg by mouth daily.     methocarbamol 500 MG tablet--Rx # 45 pills  Commonly known as:  ROBAXIN  Take 1 tablet (500 mg total) by mouth every 6 (six) hours as needed for muscle spasms.     Oxycodone HCl 10 MG Tabs--Rx # 120 pills   Take 1 tablet (10 mg total) by mouth every 6 (six) hours as needed for severe pain.     pantoprazole 40 MG tablet  Commonly known as:  PROTONIX  Take 1 tablet (40 mg total) by mouth 2 (two) times daily.     polyethylene glycol packet  Commonly known as:  MIRALAX / GLYCOLAX  Take 17 g by mouth daily.     senna 8.6 MG Tabs tablet  Commonly known as:  SENOKOT  Take 1 tablet (8.6 mg total) by mouth 2 (two) times daily.     Vitamin D  (Ergocalciferol) 50000 UNITS Caps capsule  Commonly known as:  DRISDOL  Take 50,000 Units by mouth as needed.           Follow-up Information   Follow up with Meredith Staggers, MD On 10/13/2014. (Be there at 9:30 for  10 am appointment)    Specialty:  Physical Medicine and Rehabilitation   Contact information:   [address], [address] [place] [address] [phone]       Follow up with Elaina Hoops, MD. Call today. (for follow  up appointment)    Specialty:  Neurosurgery   Contact information:   [address] [place] [address] [phone]       Signed: Bary Leriche 09/08/2014, 9:30 AM

## 2014-09-08 NOTE — Progress Notes (Signed)
PHYSICAL MEDICINE & REHABILITATION     PROGRESS NOTE    Subjective/Complaints: Excited to be going home. Able to work through pain.  Objective: Vital Signs: Blood pressure 147/59, pulse 66, temperature 97.4 F (36.3 C), temperature source Oral, resp. rate 17, height  (1.753 m), weight 74 kg (163 lb 2.3 oz), SpO2 97.00%. No results found. No results found for this basename: WBC, HGB, HCT, PLT,  in the last 72 hours No results found for this basename: NA, K, CL, CO, GLUCOSE, BUN, CREATININE, CALCIUM,  in the last 72 hours CBG (last 3)   Recent Labs  09/07/14 1216 09/07/14 1652 09/07/14 2145  GLUCAP 106* 185* 181*    Wt Readings from Last 3 Encounters:  09/02/14 74 kg (163 lb 2.3 oz)  08/25/14 86.4 kg (190 lb 7.6 oz)  08/25/14 86.4 kg (190 lb 7.6 oz)    Physical Exam:  Nursing note and vitals reviewed.  Constitutional: She is oriented to person, place, and time. She appears well-developed and well-nourished.  HENT: oral mucosa pink and moist  Head: Normocephalic and atraumatic.  Eyes: Conjunctivae are normal. Pupils are equal, round, and reactive to light. (still no redness OS) Neck: Normal range of motion. Neck supple.  Cardiovascular: Normal rate and regular rhythm. No murmurs or rubs.  Respiratory: Effort normal and breath sounds normal. No respiratory distress. She has no wheezes. No rales  GI: Soft. Bowel sounds are normal. She exhibits no distension. There is no tenderness.  Musculoskeletal: She exhibits no edema.  Back pain with SLR BLE. Left hip pain with ROM at hip.  Neurological: She is alert and oriented to person, place, and time. No cranial nerve deficit. Coordination normal.  Good sitting balance. Quads,KE 4/5.  ADF/APF 5/5. UE's grossly 5/5 prox to distal. Pain in L3 distribution, left > right legs. No focal sensory loss is appreciated however except for mild stocking glove sensory loss in the feet (to ankles). Cognitively displays normal  insight, awareness, memory.  Skin: Skin is warm and dry.  Psychiatric: She has a normal mood and affect. Her behavior is normal. Thought content normal      Assessment/Plan: 1. Functional deficits secondary to Left L3 lumbar radiculopathy/stenosis s/p L3-4 laminectomy and fusion which require 3+ hours per day of interdisciplinary therapy in a comprehensive inpatient rehab setting. Physiatrist is providing close team supervision and 24 hour management of active medical problems listed below. Physiatrist and rehab team continue to assess barriers to discharge/monitor patient progress toward functional and medical goals. FIM: FIM - Bathing Bathing Steps Patient Completed: Chest;Right Arm;Left Arm;Abdomen;Front perineal area;Buttocks;Right upper leg;Left upper leg;Right lower leg (including foot);Left lower leg (including foot) Bathing: 4: Steadying assist  FIM - Upper Body Dressing/Undressing Upper body dressing/undressing steps patient completed: Thread/unthread right sleeve of pullover shirt/dresss;Thread/unthread left sleeve of pullover shirt/dress;Put head through opening of pull over shirt/dress;Pull shirt over trunk Upper body dressing/undressing: 5: Supervision: Safety issues/verbal cues FIM - Lower Body Dressing/Undressing Lower body dressing/undressing steps patient completed: Pull pants up/down Lower body dressing/undressing: 0: Activity did not occur  FIM - Toileting Toileting steps completed by patient: Adjust clothing prior to toileting;Performs perineal hygiene;Adjust clothing after toileting Toileting Assistive Devices: Grab bar or rail for support Toileting: 4: Steadying assist  FIM - Diplomatic Services operational officer Devices: Best boy Transfers: 5-To toilet/BSC: Supervision (verbal cues/safety issues);6-From toilet/BSC  FIM - Banker Devices: Environmental consultant;Arm rests;Orthosis Bed/Chair Transfer: 4: Bed > Chair or  W/C: Min A (steadying  Pt. > 75%);4: Chair or W/C > Bed: Min A (steadying Pt. > 75%)  FIM - Locomotion: Wheelchair Distance: 300 Locomotion: Wheelchair: 5: Travels 150 ft or more: maneuvers on rugs and over door sills with supervision, cueing or coaxing FIM - Locomotion: Ambulation Locomotion: Ambulation Assistive Devices: Orthosis;Walker - Rolling Ambulation/Gait Assistance: 5: Supervision Locomotion: Ambulation: 1: Travels less than 50 ft with supervision/safety issues  Comprehension Comprehension Mode: Auditory Comprehension: 5-Follows basic conversation/direction: With extra time/assistive device  Expression Expression Mode: Verbal Expression: 5-Expresses complex 90% of the time/cues < 10% of the time  Social Interaction Social Interaction: 5-Interacts appropriately 90% of the time - Needs monitoring or encouragement for participation or interaction.  Problem Solving Problem Solving: 3-Solves basic 50 - 74% of the time/requires cueing 25 - 49% of the time  Memory Memory: 3-Recognizes or recalls 50 - 74% of the time/requires cueing 25 - 49% of the time Medical Problem List and Plan:  1. Functional deficits secondary to Left Lumbar 3 radiculopathy/stenosis s/p L3-4 laminectomy and fusion  2. DVT Prophylaxis/Anticoagulation: Mechanical: Sequential compression devices, below knee Bilateral lower extremities  3. Pain Management: now on oxycodone prn for pain management.   -gabapentin for radicular pain  -continued outpt pain mgt 4. Mood: Team to provide ego support. LCSW to follow for evaluation and support.  5. Neuropsych: This patient is capable of making decisions on her own behalf.  6. Skin/Wound Care:   - Routine pressure relief measures.   -nutrition 7. DM type 2:  . Continue lantus 10 units and and use SSI for elevated BS.   -reasonable control   -outpt management of dm per pcp 8. 100k Klebsiella oxytoca: impenem    -urine re-cultured with insignificant growth---dc  abx 9. HTN: Monitor BP every 8 hours. Continue norvasc, Lisinopril and tenormin  10. Constipation: augment bowel regimen with daily miralax. Supp/enema prn 11. FEN: encourage fluids 12. Left eye discomfort--improved  -visine drops prn  LOS (Days) 8 A FACE TO FACE EVALUATION WAS PERFORMED  Carollee Nussbaumer T 09/08/2014 8:29 AM

## 2014-09-08 NOTE — Discharge Instructions (Signed)
Inpatient Rehab Discharge Instructions  April [name] DischZYA FINKLEime:  09/08/14  Activities/Precautions/ Functional Status: Activity: no lifting, driving, or strenuous exercise till cleared by MD. No bending, twisting or arching.  Diet: regular diet Wound Care: keep wound clean and dry  Functional status:  ___ No restrictions     ___ Walk up steps independently _X__ 24/7 supervision/assistance   ___ Walk up steps with assistance ___ Intermittent supervision/assistance  ___ Bathe/dress independently ___ Walk with walker     _X__ Bathe/dress with assistance ___ Walk Independently    ___ Shower independently ___ Walk with assistance    ___ Shower with assistance _X__ No alcohol     ___ Return to work/school ________   COMMUNITY REFERRALS UPON DISCHARGE:    Home Health:   PT     OT                     Agency: [facility] @ [phone]     Special Instructions:    My questions have been answered and I understand these instructions. I will adhere to these goals and the provided educational materials after my discharge from the hospital.  Patient/Caregiver Signature _______________________________ Date __________  Clinician Signature _______________________________________ Date __________  Please bring this form and your medication list with you to all your follow-up doctor's appointments.

## 2014-09-08 NOTE — Progress Notes (Signed)
Social Work   Discharge Note  The overall goal for the admission was met for:   Discharge location: Yes - home with daughters to continue providing 24/7 assist  Length of Stay: Yes - 8 days  Discharge activity level: Yes - supervision to light assist  Home/community participation: Yes  Services provided included: MD, RD, PT, OT, RN, TR, Pharmacy and SW  Financial Services: Worker's Comp  Follow-up services arranged: Home Health: PT, OT via [facility] and Patient/Family has no preference for HH/DME agencies  Comments (or additional information):  Patient/Family verbalized understanding of follow-up arrangements: Yes  Individual responsible for coordination of the follow-up plan: patient  Confirmed correct DME delivered: NA - had all needed DME    Lindsy Cerullo

## 2014-09-15 ENCOUNTER — Telehealth: Payer: Self-pay | Admitting: *Deleted

## 2014-09-15 NOTE — Telephone Encounter (Signed)
Called for verbal order for PT plano of care 2wk4 gait training.  Verbal approval given.

## 2014-10-05 ENCOUNTER — Telehealth: Payer: Self-pay | Admitting: *Deleted

## 2014-10-08 ENCOUNTER — Other Ambulatory Visit (HOSPITAL_COMMUNITY): Payer: Self-pay | Admitting: Neurosurgery

## 2014-10-08 DIAGNOSIS — I82403 Acute embolism and thrombosis of unspecified deep veins of lower extremity, bilateral: Secondary | ICD-10-CM

## 2014-10-08 NOTE — Telephone Encounter (Signed)
Called April [name] back and left message for her to return my call

## 2014-10-09 ENCOUNTER — Other Ambulatory Visit (HOSPITAL_COMMUNITY): Payer: Self-pay | Admitting: Neurosurgery

## 2014-10-09 ENCOUNTER — Ambulatory Visit (HOSPITAL_COMMUNITY)
Admission: RE | Admit: 2014-10-09 | Discharge: 2014-10-09 | Disposition: A | Discharge status: Home / Self Care | Payer: Medicare Other | Source: Ambulatory Visit | Attending: Neurosurgery | Admitting: Neurosurgery

## 2014-10-09 ENCOUNTER — Other Ambulatory Visit (HOSPITAL_COMMUNITY): Payer: Self-pay

## 2014-10-09 DIAGNOSIS — I82403 Acute embolism and thrombosis of unspecified deep veins of lower extremity, bilateral: Secondary | ICD-10-CM

## 2014-10-13 ENCOUNTER — Encounter: Payer: Self-pay | Admitting: Physical Medicine & Rehabilitation

## 2014-10-13 ENCOUNTER — Encounter
Payer: Worker's Compensation | Attending: Physical Medicine & Rehabilitation | Admitting: Physical Medicine & Rehabilitation

## 2014-10-13 VITALS — BP 153/45 | HR 68 | Resp 14 | Wt 173.6 lb

## 2014-10-13 DIAGNOSIS — I824Z9 Acute embolism and thrombosis of unspecified deep veins of unspecified distal lower extremity: Secondary | ICD-10-CM | POA: Insufficient documentation

## 2014-10-13 DIAGNOSIS — R350 Frequency of micturition: Secondary | ICD-10-CM | POA: Insufficient documentation

## 2014-10-13 DIAGNOSIS — I82409 Acute embolism and thrombosis of unspecified deep veins of unspecified lower extremity: Secondary | ICD-10-CM | POA: Diagnosis not present

## 2014-10-13 DIAGNOSIS — M5416 Radiculopathy, lumbar region: Secondary | ICD-10-CM

## 2014-10-13 DIAGNOSIS — I824Z3 Acute embolism and thrombosis of unspecified deep veins of distal lower extremity, bilateral: Secondary | ICD-10-CM

## 2014-10-13 DIAGNOSIS — S72002S Fracture of unspecified part of neck of left femur, sequela: Secondary | ICD-10-CM

## 2014-10-13 MED ORDER — OXYBUTYNIN CHLORIDE 5 MG PO TABS: 5.0000 mg | ORAL_TABLET | Freq: Three times a day (TID) | ORAL | Status: DC

## 2014-10-13 NOTE — Progress Notes (Signed)
Subjective:    Patient ID: April [name], female    DOB: 11/22/1925, 80 y.o.   MRN: [id number]  HPI  April [name] is back regarding her lumbar radiculopathy and subsequent surgery. She was found to have bilateral calf DVT's and was placed on xarelto by her PBP. Follow up dopplers are negative.   She is using a walker for ambulation. She has had HHPT  Which stopped about a week ago.   Her left leg remains a little sore. She feels that the leg is better since she started the xarelto.   Her bladder frequency is present but overall better. Her bowels are moving regularly.    Pain Inventory Average Pain 6 Pain Right Now 5 My pain is aching  In the last 24 hours, has pain interfered with the following? General activity 0 Relation with others 0 Enjoyment of life 0 What TIME of day is your pain at its worst? evening Sleep (in general) Fair  Pain is worse with: walking, inactivity, standing and some activites Pain improves with: medication Relief from Meds: 6  Mobility use a walker how many minutes can you walk? 5 ability to climb steps?  yes do you drive?  no  Function I need assistance with the following:  meal prep, household duties and shopping  Neuro/Psych bladder control problems weakness trouble walking depression  Prior Studies blood clots and doppler  Physicians involved in your care Neurosurgeon Cram   History reviewed. No pertinent family history. History   Social History  . Marital Status: Single    Spouse Name: N/A    Number of Children: N/A  . Years of Education: N/A   Social History Main Topics  . Smoking status: Never Smoker   . Smokeless tobacco: Never Used  . Alcohol Use: No  . Drug Use: No  . Sexual Activity: No   Other Topics Concern  . None   Social History Narrative  . None   Past Surgical History  Procedure Laterality Date  . Cholecystectomy    . Hip arthroplasty Left 04/08/2014    Procedure: LEFT HIP  HEMIARTHROPLASTY;  Surgeon: Verlee RossettiSteven R Norris, MD;  [facility];  Service: Orthopedics;  Laterality: Left;  . Tonsillectomy    . Adenoidectomy    . Left cataract removed    . Eye surgery     Past Medical History  Diagnosis Date  . Hyperlipidemia 04/07/2014    takes Lovastatin daily  . OA (osteoarthritis) 04/07/2014  . Hypertension     takes Amlodipine and Atenolol daily  . HTN (hypertension) 04/07/2014  . Diabetes mellitus without complication     takes Lantus daily  . Hypothyroidism 04/07/2014    takes Synthroid daily  . History of bronchitis 20 yrs ago  . History of lupus 3037yrs ago  . Joint pain   . Peripheral edema   . UTI (lower urinary tract infection)     completed antibioitcs on 07/28/14  . Urinary frequency     takes Vesicare daily  . Chronic back pain     stenosis  . Constipation     takes an OTC stool softener   . GERD (gastroesophageal reflux disease)     not on Protonix daily  . Nocturia   . Cataract     left eye  . History of shingles    BP 153/45  Pulse 68  Resp 14  Wt 173 lb 9.6 oz (78.744 kg)  SpO2 97%  Opioid Risk Score:   Fall  Risk Score: High Fall Risk (>13 points) (educated and given handout for fall prevention) Review of Systems  Cardiovascular: Positive for leg swelling.  Endocrine:       High blood sugar  Musculoskeletal: Positive for gait problem.  Skin: Positive for rash.  Neurological: Positive for weakness and numbness.       Tingling  All other systems reviewed and are negative.      Objective:   Physical Exam  Constitutional: She is oriented to person, place, and time. She appears well-developed and well-nourished.  HENT: oral mucosa pink and moist  Head: Normocephalic and atraumatic.  Eyes: Conjunctivae are normal. Pupils are equal, round, and reactive to light.   Neck: Normal range of motion. Neck supple.  Cardiovascular: Normal rate and regular rhythm. No murmurs or rubs.  Respiratory: Effort normal and breath sounds  normal. No respiratory distress. She has no wheezes. No rales  GI: Soft. Bowel sounds are normal. She exhibits no distension. There is no tenderness.  Musculoskeletal: She exhibits no edema.  Homan's + left. Antalgic on left leg with weight bearing Neurological: She is alert and oriented to person, place, and time. No cranial nerve deficit. Coordination normal.  Good sitting balance. Quads,KE 5/5. ADF/APF 5/5. UE's grossly 5/5 prox to distal.  No focal sensory loss is appreciated however except for mild stocking glove sensory loss in the feet (to ankles). Cognitively displays normal insight, awareness, memory.  Skin: Skin is warm and dry.  Psychiatric: She has a normal mood and affect. Her behavior is normal. Thought content normal    Assessment/Plan:   1. Functional deficits secondary to Left Lumbar 3 radiculopathy/stenosis s/p L3-4 laminectomy and fusion   -Outpt PT for gait training, advance to cane 2. DVT rx with xarelto for 2 weeks then re-check dopplers  -if dopplers clear, I would continue xarelto for 2 more weeks then stop.  -if dopplers still + for DVT, i would continue xarelto for 4 weeks and re-check doppler  3. Pain Management: can continue oxycodone for pain mgt, 10mg  q12.  4. Bladder: change to ditropan for hs urinary frequency, 5mg .  -dc vesicare  Follow up with me in 2 months. Thirty minutes of face to face patient care time were spent during this visit. All questions were encouraged and answered.

## 2014-10-13 NOTE — Patient Instructions (Signed)
CONTINUE WALKING AS MUCH AS YOU CAN.  ELEVATE LEGS TO HELP WITH SWELLING IF IT OCCURS  MAY USES MOIST HEAT TO CALVES FOR PAIN RELIEF.

## 2014-10-23 ENCOUNTER — Other Ambulatory Visit (HOSPITAL_COMMUNITY): Payer: Self-pay | Admitting: Family Medicine

## 2014-10-23 DIAGNOSIS — I82403 Acute embolism and thrombosis of unspecified deep veins of lower extremity, bilateral: Secondary | ICD-10-CM

## 2014-10-26 ENCOUNTER — Ambulatory Visit (HOSPITAL_COMMUNITY)
Admission: RE | Admit: 2014-10-26 | Discharge: 2014-10-26 | Disposition: A | Discharge status: Home / Self Care | Payer: Medicare Other | Source: Ambulatory Visit | Attending: Family Medicine | Admitting: Family Medicine

## 2014-10-26 DIAGNOSIS — I82403 Acute embolism and thrombosis of unspecified deep veins of lower extremity, bilateral: Secondary | ICD-10-CM | POA: Diagnosis not present

## 2014-10-27 ENCOUNTER — Other Ambulatory Visit (HOSPITAL_COMMUNITY): Payer: Self-pay | Admitting: Family Medicine

## 2014-10-27 DIAGNOSIS — O2230 Deep phlebothrombosis in pregnancy, unspecified trimester: Secondary | ICD-10-CM

## 2014-11-04 ENCOUNTER — Other Ambulatory Visit (HOSPITAL_COMMUNITY): Payer: Self-pay

## 2014-11-11 ENCOUNTER — Other Ambulatory Visit: Payer: Self-pay | Admitting: Physical Medicine and Rehabilitation

## 2014-11-23 ENCOUNTER — Ambulatory Visit (HOSPITAL_COMMUNITY)
Admission: RE | Admit: 2014-11-23 | Discharge: 2014-11-23 | Disposition: A | Discharge status: Home / Self Care | Payer: Medicare Other | Source: Ambulatory Visit | Attending: Family Medicine | Admitting: Family Medicine

## 2014-11-23 DIAGNOSIS — I82501 Chronic embolism and thrombosis of unspecified deep veins of right lower extremity: Secondary | ICD-10-CM | POA: Insufficient documentation

## 2014-11-23 DIAGNOSIS — O2230 Deep phlebothrombosis in pregnancy, unspecified trimester: Secondary | ICD-10-CM

## 2014-11-23 DIAGNOSIS — M79606 Pain in leg, unspecified: Secondary | ICD-10-CM | POA: Diagnosis present

## 2014-12-09 ENCOUNTER — Encounter
Payer: Worker's Compensation | Attending: Physical Medicine & Rehabilitation | Admitting: Physical Medicine & Rehabilitation

## 2014-12-09 ENCOUNTER — Encounter: Payer: Self-pay | Admitting: Physical Medicine & Rehabilitation

## 2014-12-09 VITALS — BP 126/62 | HR 58 | Resp 14

## 2014-12-09 DIAGNOSIS — G8929 Other chronic pain: Secondary | ICD-10-CM | POA: Insufficient documentation

## 2014-12-09 DIAGNOSIS — Z79891 Long term (current) use of opiate analgesic: Secondary | ICD-10-CM | POA: Insufficient documentation

## 2014-12-09 DIAGNOSIS — N390 Urinary tract infection, site not specified: Secondary | ICD-10-CM

## 2014-12-09 DIAGNOSIS — Z86718 Personal history of other venous thrombosis and embolism: Secondary | ICD-10-CM | POA: Insufficient documentation

## 2014-12-09 DIAGNOSIS — M4806 Spinal stenosis, lumbar region: Secondary | ICD-10-CM | POA: Diagnosis present

## 2014-12-09 DIAGNOSIS — M1990 Unspecified osteoarthritis, unspecified site: Secondary | ICD-10-CM | POA: Insufficient documentation

## 2014-12-09 DIAGNOSIS — R350 Frequency of micturition: Secondary | ICD-10-CM

## 2014-12-09 DIAGNOSIS — Z794 Long term (current) use of insulin: Secondary | ICD-10-CM | POA: Insufficient documentation

## 2014-12-09 DIAGNOSIS — K219 Gastro-esophageal reflux disease without esophagitis: Secondary | ICD-10-CM | POA: Diagnosis not present

## 2014-12-09 DIAGNOSIS — K5900 Constipation, unspecified: Secondary | ICD-10-CM | POA: Insufficient documentation

## 2014-12-09 DIAGNOSIS — E785 Hyperlipidemia, unspecified: Secondary | ICD-10-CM | POA: Diagnosis not present

## 2014-12-09 DIAGNOSIS — Z7901 Long term (current) use of anticoagulants: Secondary | ICD-10-CM | POA: Diagnosis not present

## 2014-12-09 DIAGNOSIS — Z981 Arthrodesis status: Secondary | ICD-10-CM | POA: Diagnosis not present

## 2014-12-09 DIAGNOSIS — R351 Nocturia: Secondary | ICD-10-CM | POA: Insufficient documentation

## 2014-12-09 DIAGNOSIS — E039 Hypothyroidism, unspecified: Secondary | ICD-10-CM | POA: Diagnosis not present

## 2014-12-09 DIAGNOSIS — Z79899 Other long term (current) drug therapy: Secondary | ICD-10-CM | POA: Diagnosis not present

## 2014-12-09 DIAGNOSIS — I10 Essential (primary) hypertension: Secondary | ICD-10-CM | POA: Diagnosis not present

## 2014-12-09 DIAGNOSIS — M5416 Radiculopathy, lumbar region: Secondary | ICD-10-CM | POA: Insufficient documentation

## 2014-12-09 DIAGNOSIS — E119 Type 2 diabetes mellitus without complications: Secondary | ICD-10-CM | POA: Diagnosis not present

## 2014-12-09 NOTE — Progress Notes (Signed)
Subjective:    Patient ID: April [name], female    DOB: 07/16/1925, 80 y.o.   MRN: [id number]  HPI   April [name] is back regarding her lumbar radiculopathy/stensosis. She is working towards using a quad cane with PT. She is still using the rolling walker. Her back brace remains in place although she "dreads" wearing it.   She continues on xarelto for her DVT through the end of the month.   From a bladder standpoint, she continues to have frequency. She empties more at night. She stopped the ditropan due to swelling. Her PCP hasn't treated her for a UTI although she states a sample came back with "bacteria".   Overall she's pleased withher progress and is determined to get to a cane.   Pain Inventory Average Pain 5 Pain Right Now 4 My pain is constant, dull and aching  In the last 24 hours, has pain interfered with the following? General activity 0 Relation with others 0 Enjoyment of life 0 What TIME of day is your pain at its worst? evening Sleep (in general) Good  Pain is worse with: some activites Pain improves with: medication Relief from Meds: 5  Mobility use a walker how many minutes can you walk? 5 ability to climb steps?  yes do you drive?  no  Function retired  Neuro/Psych bladder control problems trouble walking spasms  Prior Studies Any changes since last visit?  no  Physicians involved in your care Any changes since last visit?  no   History reviewed. No pertinent family history. History   Social History  . Marital Status: Single    Spouse Name: N/A    Number of Children: N/A  . Years of Education: N/A   Social History Main Topics  . Smoking status: Never Smoker   . Smokeless tobacco: Never Used  . Alcohol Use: No  . Drug Use: No  . Sexual Activity: No   Other Topics Concern  . None   Social History Narrative   Past Surgical History  Procedure Laterality Date  . Cholecystectomy    . Hip arthroplasty Left 04/08/2014   Procedure: LEFT HIP HEMIARTHROPLASTY;  Surgeon: Verlee RossettiSteven R Norris, MD;  [facility];  Service: Orthopedics;  Laterality: Left;  . Tonsillectomy    . Adenoidectomy    . Left cataract removed    . Eye surgery     Past Medical History  Diagnosis Date  . Hyperlipidemia 04/07/2014    takes Lovastatin daily  . OA (osteoarthritis) 04/07/2014  . Hypertension     takes Amlodipine and Atenolol daily  . HTN (hypertension) 04/07/2014  . Diabetes mellitus without complication     takes Lantus daily  . Hypothyroidism 04/07/2014    takes Synthroid daily  . History of bronchitis 20 yrs ago  . History of lupus 3339yrs ago  . Joint pain   . Peripheral edema   . UTI (lower urinary tract infection)     completed antibioitcs on 07/28/14  . Urinary frequency     takes Vesicare daily  . Chronic back pain     stenosis  . Constipation     takes an OTC stool softener   . GERD (gastroesophageal reflux disease)     not on Protonix daily  . Nocturia   . Cataract     left eye  . History of shingles    BP 126/62 mmHg  Pulse 58  Resp 14  SpO2 96%  Opioid Risk Score:   Fall  Risk Score: High Fall Risk (>13 points)  Review of Systems  Constitutional: Negative.   HENT: Negative.   Eyes: Negative.   Respiratory: Negative.   Cardiovascular: Negative.   Gastrointestinal: Negative.   Endocrine: Negative.   Genitourinary: Negative.   Musculoskeletal: Positive for myalgias and back pain.  Skin: Negative.   Allergic/Immunologic: Negative.   Neurological: Negative.        TROUBLE WALKING, SPASMS  Hematological: Negative.   Psychiatric/Behavioral: Negative.        Objective:   Physical Exam   Constitutional: She is oriented to person, place, and time. She appears well-developed and well-nourished.  HENT: oral mucosa pink and moist  Head: Normocephalic and atraumatic.  Eyes: Conjunctivae are normal. Pupils are equal, round, and reactive to light.  Neck: Normal range of motion. Neck supple.    Cardiovascular: Normal rate and regular rhythm. No murmurs or rubs.  Respiratory: Effort normal and breath sounds normal. No respiratory distress. She has no wheezes. No rales  GI: Soft. Bowel sounds are normal. She exhibits no distension. There is no tenderness.  Musculoskeletal: She exhibits no edema.  Gait is improved with better balance. She still favors the left leg slightly Neurological: She is alert and oriented to person, place, and time. No cranial nerve deficit. Coordination normal.  Good sitting balance. Quads,KE 5/5. ADF/APF 5/5. UE's grossly 5/5 prox to distal. No focal sensory loss is appreciated however except for mild stocking glove sensory loss in the feet (to ankles) persists. Cognitively displays normal insight, awareness, memory.  Skin: Skin is warm and dry.  Psychiatric: She has a normal mood and affect. Her behavior is normal. Thought content normal    Assessment/Plan:  1. Functional deficits secondary to Left Lumbar 3 radiculopathy/stenosis s/p L3-4 laminectomy and fusion  -continue Outpt PT, advance to quad cane  -might be a candidate for aquatic therapy 2. DVT rx with xarelto per pcp----thru end of the year  3. Pain Management: oxycodone per NS.  4. Bladder:  Recommend ua and cx  -urology eval would be helpful to determine pathology and treatment  -i suspect that she is experiencing overflow rather than a spastic bladder Follow up with me in 3 months. Thirty minutes of face to face patient care time were spent during this visit. All questions were encouraged and answered

## 2014-12-09 NOTE — Patient Instructions (Signed)
PLEASE CALL ME WITH ANY PROBLEMS OR QUESTIONS (#[phone]).      

## 2014-12-14 ENCOUNTER — Telehealth: Payer: Self-pay | Admitting: *Deleted

## 2014-12-14 NOTE — Telephone Encounter (Signed)
Calling with FYI about the urine culture submitted 12/09/14.  They are having problems isolating 2 organisms and it may be another 2 days before it is complete.

## 2014-12-15 LAB — URINE CULTURE: Colony Count: >=100000

## 2015-03-07 ENCOUNTER — Other Ambulatory Visit: Payer: Self-pay | Admitting: Physical Medicine and Rehabilitation

## 2015-03-10 ENCOUNTER — Encounter
Payer: Worker's Compensation | Attending: Physical Medicine & Rehabilitation | Admitting: Physical Medicine & Rehabilitation

## 2015-03-10 ENCOUNTER — Encounter: Payer: Self-pay | Admitting: Physical Medicine & Rehabilitation

## 2015-03-10 VITALS — BP 140/59 | HR 63 | Resp 14

## 2015-03-10 DIAGNOSIS — S72002S Fracture of unspecified part of neck of left femur, sequela: Secondary | ICD-10-CM | POA: Diagnosis not present

## 2015-03-10 DIAGNOSIS — I809 Phlebitis and thrombophlebitis of unspecified site: Secondary | ICD-10-CM

## 2015-03-10 DIAGNOSIS — M5416 Radiculopathy, lumbar region: Secondary | ICD-10-CM | POA: Diagnosis not present

## 2015-03-10 NOTE — Progress Notes (Signed)
Subjective:    Patient ID: April [name], female    DOB: 03/05/1925, 81 y.o.   MRN: [id number]  HPI   [name] is here in follow up of her lumbar radiculopathy. She tells me that Dr. Ranell PatrickNorris found "ghost bones at her left hip. She saw Dr. Wynetta Emeryram last week who wants to check an MRI of her low back as well. She feels that her legs are weaker. She is also having more pain in both legs (including right now). She is using her walker for support. There is some persistent numbness in her left leg which hasn't changed. She denies fever. She has had some swelling in both legs more so in the calves.   Dr. Wynetta Emeryram is writing her oxycodone 5mg  which she takes 2x daily. She has taken ibuprofen once.      Pain Inventory Average Pain 6 Pain Right Now 5 My pain is dull and stabbing  In the last 24 hours, has pain interfered with the following? General activity 7 Relation with others 7 Enjoyment of life 7 What TIME of day is your pain at its worst? morning, night  Sleep (in general) Poor  Pain is worse with: walking and standing Pain improves with: rest, heat/ice, therapy/exercise and medication Relief from Meds: 6  Mobility walk with assistance use a cane use a walker how many minutes can you walk? 10 ability to climb steps?  no do you drive?  no transfers alone  Function employed # of hrs/week . I need assistance with the following:  meal prep and household duties  Neuro/Psych bladder control problems weakness tingling trouble walking  Prior Studies Any changes since last visit?  no  Physicians involved in your care Any changes since last visit?  no   No family history on file. History   Social History  . Marital Status: Single    Spouse Name: N/A  . Number of Children: N/A  . Years of Education: N/A   Social History Main Topics  . Smoking status: Never Smoker   . Smokeless tobacco: Never Used  . Alcohol Use: No  . Drug Use: No  . Sexual Activity: No    Other Topics Concern  . Not on file   Social History Narrative   Past Surgical History  Procedure Laterality Date  . Cholecystectomy    . Hip arthroplasty Left 04/08/2014    Procedure: LEFT HIP HEMIARTHROPLASTY;  Surgeon: Verlee RossettiSteven R Norris, MD;  [facility];  Service: Orthopedics;  Laterality: Left;  . Tonsillectomy    . Adenoidectomy    . Left cataract removed    . Eye surgery     Past Medical History  Diagnosis Date  . Hyperlipidemia 04/07/2014    takes Lovastatin daily  . OA (osteoarthritis) 04/07/2014  . Hypertension     takes Amlodipine and Atenolol daily  . HTN (hypertension) 04/07/2014  . Diabetes mellitus without complication     takes Lantus daily  . Hypothyroidism 04/07/2014    takes Synthroid daily  . History of bronchitis 20 yrs ago  . History of lupus 4250yrs ago  . Joint pain   . Peripheral edema   . UTI (lower urinary tract infection)     completed antibioitcs on 07/28/14  . Urinary frequency     takes Vesicare daily  . Chronic back pain     stenosis  . Constipation     takes an OTC stool softener   . GERD (gastroesophageal reflux disease)  not on Protonix daily  . Nocturia   . Cataract     left eye  . History of shingles    There were no vitals taken for this visit.  Opioid Risk Score:   Fall Risk Score:    Review of Systems  Cardiovascular: Positive for leg swelling.  Endocrine:       High blood sugar  Genitourinary:       Incontinence  Musculoskeletal: Positive for joint swelling, arthralgias and gait problem.  Neurological: Positive for weakness.       Tingling   All other systems reviewed and are negative.      Objective:   Physical Exam Constitutional: She is oriented to person, place, and time. She appears well-developed and well-nourished.  HENT: oral mucosa pink and moist  Head: Normocephalic and atraumatic.  Eyes: Conjunctivae are normal. Pupils are equal, round, and reactive to light.  Neck: Normal range of motion. Neck  supple.  Cardiovascular: Normal rate and regular rhythm. No murmurs or rubs.  Respiratory: Effort normal and breath sounds normal. No respiratory distress. She has no wheezes. No rales  GI: Soft. Bowel sounds are normal. She exhibits no distension. There is no tenderness.  Musculoskeletal: She exhibits no edema.  She is somewhat antalgic on the right with more antalgia noted still on the left. Neurological: She is alert and oriented to person, place, and time. No cranial nerve deficit. Coordination normal.  Good sitting balance. Quads,KE 5/5. ADF/APF 5/5. UE's grossly 5/5 prox to distal. No focal sensory loss is appreciated however except for mild stocking glove sensory loss in the feet (to ankles). Cognitively displays normal insight, awareness, memory.  Skin: Skin is warm and dry.  Psychiatric: She has a normal mood and affect. Her behavior is normal. Thought content normal    Assessment/Plan:  1. Functional deficits secondary to Left Lumbar 3 radiculopathy/stenosis s/p L3-4 laminectomy and fusion  -continue with walker for now. -follow up MRI per Dr. Wynetta Emery 2. DVT/? Chronic phlebitis bilateral LE's--TEDS, moist heat, maintain activity to tolerance  -no dopplers today, but described symptoms which she needs to look out for  -  ibuprofen  qd prn would be ok with food 3. Pain Management: oxycodone per NS.  4. Bladder: follow up per pcp regarding UTI  5. ?heterotopic bone--left hip? Per Dr. Ranell Patrick   Follow up with me in 3 months. Thirty minutes of face to face patient care time were spent during this visit. All questions were encouraged and answered

## 2015-03-10 NOTE — Patient Instructions (Addendum)
IBUPROFEN 200MG  DAILY AS NEEDED FOR LEG PAIN (WITH FOOD)  FOLLOW UP REGARDING YOUR MRI RESULTS.   KNEE HIGH TED STOCKINGS, REGULAR EXERCISE AND STRETCHING FOR YOUR LEGS  MOIST HEAT TO YOUR LEGS  PLEASE CALL ME IF YOU DEVELOP MORE SWELLING, LEG PAIN, WARMTH OR FEVER (#[phone]).

## 2015-06-09 ENCOUNTER — Encounter
Payer: Worker's Compensation | Attending: Physical Medicine & Rehabilitation | Admitting: Physical Medicine & Rehabilitation

## 2015-06-09 ENCOUNTER — Encounter: Payer: Self-pay | Admitting: Physical Medicine & Rehabilitation

## 2015-06-09 ENCOUNTER — Encounter: Payer: Worker's Compensation | Admitting: Physical Medicine & Rehabilitation

## 2015-06-09 VITALS — BP 153/59 | HR 63

## 2015-06-09 DIAGNOSIS — M5416 Radiculopathy, lumbar region: Secondary | ICD-10-CM | POA: Diagnosis present

## 2015-06-09 DIAGNOSIS — E1140 Type 2 diabetes mellitus with diabetic neuropathy, unspecified: Secondary | ICD-10-CM

## 2015-06-09 DIAGNOSIS — M48061 Spinal stenosis, lumbar region without neurogenic claudication: Secondary | ICD-10-CM

## 2015-06-09 DIAGNOSIS — M898X9 Other specified disorders of bone, unspecified site: Secondary | ICD-10-CM

## 2015-06-09 DIAGNOSIS — I809 Phlebitis and thrombophlebitis of unspecified site: Secondary | ICD-10-CM | POA: Insufficient documentation

## 2015-06-09 DIAGNOSIS — M948X9 Other specified disorders of cartilage, unspecified sites: Secondary | ICD-10-CM

## 2015-06-09 DIAGNOSIS — M4806 Spinal stenosis, lumbar region: Secondary | ICD-10-CM

## 2015-06-09 DIAGNOSIS — S72002S Fracture of unspecified part of neck of left femur, sequela: Secondary | ICD-10-CM | POA: Diagnosis not present

## 2015-06-09 DIAGNOSIS — E1142 Type 2 diabetes mellitus with diabetic polyneuropathy: Secondary | ICD-10-CM | POA: Insufficient documentation

## 2015-06-09 MED ORDER — OXYCODONE HCL 10 MG PO TABS: 10.0000 mg | ORAL_TABLET | Freq: Four times a day (QID) | ORAL | Status: DC | PRN

## 2015-06-09 MED ORDER — LIDOCAINE 5 % EX PTCH: 1.0000 | MEDICATED_PATCH | CUTANEOUS | Status: DC

## 2015-06-09 MED ORDER — DICLOFENAC SODIUM 1 % TD GEL: 1.0000 "application " | Freq: Three times a day (TID) | TRANSDERMAL | Status: DC

## 2015-06-09 NOTE — Patient Instructions (Signed)
CONTACT ME ABOUT THE DOSING OF YOUR GABAPENTIN   DISCUSS WITH DR. Ranell Patrick REGARDING THE MANAGEMENT OF YOUR LEFT HIP. CERTAINLY THERAPY WOULD BE APPROPRIATE TO START.

## 2015-06-09 NOTE — Progress Notes (Signed)
Subjective:    Patient ID: April [name], female    DOB: 1925/07/23, 81 y.o.   MRN: [id number]  HPI  April [name] is here in follow up of her back pain and gait disorder. Dr. Wynetta Emery requested an EMG/NCS of her legs which revealed only a peripheral neuropathy by report of CM. She has been placed on hydrocodone per family request which hasn't worked as well as the oxycodone. She also was placed on gabapentin (unknown dose). Apparently MRI from 4/16 showed little change per CM report. (I don't have the scan for reference.)  A referral was made to neurology also  Pain is most severe in the left hip and thigh with radiation of tingling/burning into to her left foot. She also has pain and tingling in the right leg radiating form the thigh to the foot. It seems to be worst at night and with walking/weight bearing.  Pain Inventory Average Pain 5 Pain Right Now 6 My pain is constant, sharp, burning and gnawing  In the last 24 hours, has pain interfered with the following? General activity 1 Relation with others 3 Enjoyment of life 5 What TIME of day is your pain at its worst? all Sleep (in general) Fair  Pain is worse with: walking and standing Pain improves with: rest and medication Relief from Meds: 4  Mobility use a walker do you drive?  no Do you have any goals in this area?  yes  Function not employed: date last employed . I need assistance with the following:  shopping  Neuro/Psych bladder control problems weakness numbness depression  Prior Studies Any changes since last visit?  yes nerve study  Physicians involved in your care Any changes since last visit?  no   History reviewed. No pertinent family history. History   Social History  . Marital Status: Single    Spouse Name: N/A  . Number of Children: N/A  . Years of Education: N/A   Social History Main Topics  . Smoking status: Never Smoker   . Smokeless tobacco: Never Used  . Alcohol Use: No  . Drug  Use: No  . Sexual Activity: No   Other Topics Concern  . None   Social History Narrative   Past Surgical History  Procedure Laterality Date  . Cholecystectomy    . Hip arthroplasty Left 04/08/2014    Procedure: LEFT HIP HEMIARTHROPLASTY;  Surgeon: Verlee Rossetti, MD;  Location: [facility] OR;  Service: Orthopedics;  Laterality: Left;  . Tonsillectomy    . Adenoidectomy    . Left cataract removed    . Eye surgery     Past Medical History  Diagnosis Date  . Hyperlipidemia 04/07/2014    takes Lovastatin daily  . OA (osteoarthritis) 04/07/2014  . Hypertension     takes Amlodipine and Atenolol daily  . HTN (hypertension) 04/07/2014  . Diabetes mellitus without complication     takes Lantus daily  . Hypothyroidism 04/07/2014    takes Synthroid daily  . History of bronchitis 20 yrs ago  . History of lupus 3yrs ago  . Joint pain   . Peripheral edema   . UTI (lower urinary tract infection)     completed antibioitcs on 07/28/14  . Urinary frequency     takes Vesicare daily  . Chronic back pain     stenosis  . Constipation     takes an OTC stool softener   . GERD (gastroesophageal reflux disease)     not on Protonix daily  .  Nocturia   . Cataract     left eye  . History of shingles    BP 153/59 mmHg  Pulse 63  SpO2 96%  Opioid Risk Score:   Fall Risk Score: Moderate Fall Risk (6-13 points)`1  Depression screen PHQ 2/9  Depression screen PHQ 2/9 03/10/2015  Decreased Interest 0  Down, Depressed, Hopeless 0  PHQ - 2 Score 0  Altered sleeping 0  Tired, decreased energy 0  Change in appetite 0  Feeling bad or failure about yourself  0  Trouble concentrating 0  Moving slowly or fidgety/restless 0  Suicidal thoughts 0  PHQ-9 Score 0      Review of Systems  Constitutional:       Weight gain  Cardiovascular: Positive for leg swelling.  Endocrine:       High blood sugar  Genitourinary: Positive for frequency.  Neurological: Positive for weakness and numbness.    Psychiatric/Behavioral: Positive for dysphoric mood.  All other systems reviewed and are negative.      Objective:   Physical Exam   Constitutional: She is oriented to person, place, and time. She appears well-developed and well-nourished.  HENT: oral mucosa pink and moist  Head: Normocephalic and atraumatic.  Eyes: Conjunctivae are normal. Pupils are equal, round, and reactive to light.  Neck: Normal range of motion. Neck supple.  Cardiovascular: Normal rate and regular rhythm. No murmurs or rubs.  Respiratory: Effort normal and breath sounds normal. No respiratory distress. She has no wheezes. No rales  GI: Soft. Bowel sounds are normal. She exhibits no distension. There is no tenderness.  Musculoskeletal: She exhibits mild edema lower ext edema, left more than right  Increasing antalgia left lower ext, large palpable bone mass along lateral hip---area very tender to touch throughout (inferior to superior) Neurological: She is alert and oriented to person, place, and time. No cranial nerve deficit. Coordination normal.  Good sitting balance. Quads,KE 5/5. ADF/APF 5/5. UE's grossly 5/5 prox to distal. No focal sensory loss is appreciated however except for mild stocking glove sensory loss in the feet (to ankles.ower leg). Cognitively displays normal insight, awareness, memory.  Skin: Skin is warm and dry.  Psychiatric: She has a normal mood and affect. Her behavior is normal. Thought content normal    Assessment/Plan:  1. Left Lumbar 3 radiculopathy/stenosis s/p L3-4 laminectomy and fusion    2. Diabetes with peripheral neuropathy: likely component of distal, bilateral leg pain 3. Apparent, substantial heterotopic bone--left hip---this is likely the biggest component to her pain picture   Plan: 1. continue with walker for now for balance and stability 2. Resume oxcyodone 10mg  q6 prn #120, discard hydrocodone. She has tolerated the oxycodone well in the past. A CSA was signed.  3.  Pt/family will call with gabapentin dose. (currently 100mg  BID or TID?) This likely can be titrated upward paying close attention to pt tolerance.  4. Follow up with Dr. Ranell Patrick regarding left hip. At the very least I would recommend therapy address ROM, left hip strengthening, modalities 5. Trial of voltaren gel TID during the day 6. Lidoderm patch to left hip at night.   Time was spent reviewing case with CM. She will provide me recent studies for review as well.  Follow up with me in 3 months. Thirty minutes of face to face patient care time were spent during this visit. All questions were encouraged and answered

## 2015-06-11 ENCOUNTER — Telehealth: Payer: Self-pay | Admitting: *Deleted

## 2015-06-11 NOTE — Telephone Encounter (Signed)
Error - no action taken at this time

## 2015-07-16 ENCOUNTER — Encounter: Payer: Self-pay | Admitting: Registered Nurse

## 2015-07-16 ENCOUNTER — Encounter: Payer: Worker's Compensation | Attending: Registered Nurse | Admitting: Registered Nurse

## 2015-07-16 VITALS — BP 132/69 | HR 66 | Resp 16

## 2015-07-16 DIAGNOSIS — M329 Systemic lupus erythematosus, unspecified: Secondary | ICD-10-CM | POA: Insufficient documentation

## 2015-07-16 DIAGNOSIS — M48061 Spinal stenosis, lumbar region without neurogenic claudication: Secondary | ICD-10-CM

## 2015-07-16 DIAGNOSIS — Z981 Arthrodesis status: Secondary | ICD-10-CM | POA: Insufficient documentation

## 2015-07-16 DIAGNOSIS — M4806 Spinal stenosis, lumbar region: Secondary | ICD-10-CM | POA: Insufficient documentation

## 2015-07-16 DIAGNOSIS — M948X9 Other specified disorders of cartilage, unspecified sites: Secondary | ICD-10-CM | POA: Diagnosis not present

## 2015-07-16 DIAGNOSIS — M5416 Radiculopathy, lumbar region: Secondary | ICD-10-CM | POA: Insufficient documentation

## 2015-07-16 DIAGNOSIS — K219 Gastro-esophageal reflux disease without esophagitis: Secondary | ICD-10-CM | POA: Insufficient documentation

## 2015-07-16 DIAGNOSIS — S72002S Fracture of unspecified part of neck of left femur, sequela: Secondary | ICD-10-CM | POA: Diagnosis not present

## 2015-07-16 DIAGNOSIS — E785 Hyperlipidemia, unspecified: Secondary | ICD-10-CM | POA: Diagnosis not present

## 2015-07-16 DIAGNOSIS — E039 Hypothyroidism, unspecified: Secondary | ICD-10-CM | POA: Insufficient documentation

## 2015-07-16 DIAGNOSIS — Z5181 Encounter for therapeutic drug level monitoring: Secondary | ICD-10-CM

## 2015-07-16 DIAGNOSIS — E1140 Type 2 diabetes mellitus with diabetic neuropathy, unspecified: Secondary | ICD-10-CM | POA: Diagnosis not present

## 2015-07-16 DIAGNOSIS — Z889 Allergy status to unspecified drugs, medicaments and biological substances status: Secondary | ICD-10-CM | POA: Diagnosis not present

## 2015-07-16 DIAGNOSIS — M25552 Pain in left hip: Secondary | ICD-10-CM | POA: Diagnosis present

## 2015-07-16 DIAGNOSIS — I10 Essential (primary) hypertension: Secondary | ICD-10-CM | POA: Insufficient documentation

## 2015-07-16 DIAGNOSIS — M545 Low back pain: Secondary | ICD-10-CM | POA: Diagnosis present

## 2015-07-16 DIAGNOSIS — Z79899 Other long term (current) drug therapy: Secondary | ICD-10-CM

## 2015-07-16 DIAGNOSIS — M898X9 Other specified disorders of bone, unspecified site: Secondary | ICD-10-CM

## 2015-07-16 DIAGNOSIS — E1142 Type 2 diabetes mellitus with diabetic polyneuropathy: Secondary | ICD-10-CM

## 2015-07-16 MED ORDER — OXYCODONE HCL 10 MG PO TABS: 10.0000 mg | ORAL_TABLET | Freq: Four times a day (QID) | ORAL | Status: DC | PRN

## 2015-07-16 NOTE — Progress Notes (Signed)
Subjective:    Patient ID: April [name], female    DOB: 20-Apr-1925, 81 y.o.   MRN: [id number]  HPI: Ms. April [name] is a 81 year old female who returns for follow up appointment and medication refill. She says her pain is located in her lower back and left hip. She rates her pain 5. She is not following an exercise regime she states per MD orders. She is walking in her home with walker. She has  followed up with Dr. Ranell Patrick and Dr. Wynetta Emery, she's  scheduled for [facility] in August 2016 with Dr. Wynetta Emery. Daughter in room all questions answered.   Pain Inventory Average Pain 5 Pain Right Now 5 My pain is constant and aching  In the last 24 hours, has pain interfered with the following? General activity 4 Relation with others 4 Enjoyment of life 4 What TIME of day is your pain at its worst? evening Sleep (in general) Fair  Pain is worse with: walking and standing Pain improves with: medication Relief from Meds: 6  Mobility use a walker ability to climb steps?  no do you drive?  no  Function employed # of hrs/week wkman comp currently I need assistance with the following:  household duties and shopping  Neuro/Psych bladder control problems tingling trouble walking  Prior Studies Any changes since last visit?  no  Physicians involved in your care Any changes since last visit?  no   History reviewed. No pertinent family history. History   Social History  . Marital Status: Single    Spouse Name: N/A  . Number of Children: N/A  . Years of Education: N/A   Social History Main Topics  . Smoking status: Never Smoker   . Smokeless tobacco: Never Used  . Alcohol Use: No  . Drug Use: No  . Sexual Activity: No   Other Topics Concern  . None   Social History Narrative   Past Surgical History  Procedure Laterality Date  . Cholecystectomy    . Hip arthroplasty Left 04/08/2014    Procedure: LEFT HIP HEMIARTHROPLASTY;  Surgeon: Verlee Rossetti, MD;  Location: [facility]  OR;  Service: Orthopedics;  Laterality: Left;  . Tonsillectomy    . Adenoidectomy    . Left cataract removed    . Eye surgery     Past Medical History  Diagnosis Date  . Hyperlipidemia 04/07/2014    takes Lovastatin daily  . OA (osteoarthritis) 04/07/2014  . Hypertension     takes Amlodipine and Atenolol daily  . HTN (hypertension) 04/07/2014  . Diabetes mellitus without complication     takes Lantus daily  . Hypothyroidism 04/07/2014    takes Synthroid daily  . History of bronchitis 20 yrs ago  . History of lupus 23yrs ago  . Joint pain   . Peripheral edema   . UTI (lower urinary tract infection)     completed antibioitcs on 07/28/14  . Urinary frequency     takes Vesicare daily  . Chronic back pain     stenosis  . Constipation     takes an OTC stool softener   . GERD (gastroesophageal reflux disease)     not on Protonix daily  . Nocturia   . Cataract     left eye  . History of shingles    BP 146/67 mmHg  Pulse 66  Resp 16  SpO2 95%  Opioid Risk Score:   Fall Risk Score:  `1  Depression screen PHQ 2/9  Depression screen  [facility] 2/9 07/16/2015 03/10/2015  Decreased Interest 0 0  Down, Depressed, Hopeless 0 0  PHQ - 2 Score 0 0  Altered sleeping - 0  Tired, decreased energy - 0  Change in appetite - 0  Feeling bad or failure about yourself  - 0  Trouble concentrating - 0  Moving slowly or fidgety/restless - 0  Suicidal thoughts - 0  PHQ-9 Score - 0    Review of Systems  Genitourinary:       Bladder control problems  Musculoskeletal: Positive for gait problem.  Neurological:       Tingling  All other systems reviewed and are negative.      Objective:   Physical Exam  Constitutional: She is oriented to person, place, and time. She appears well-developed and well-nourished.  HENT:  Head: Normocephalic and atraumatic.  Neck: Normal range of motion. Neck supple.  Cardiovascular: Normal rate and regular rhythm.   Pulmonary/Chest: Effort normal and breath  sounds normal.  Musculoskeletal: She exhibits edema.  Normal Muscle Bulk and Muscle Testing Reveals: Upper Extremities: Left:Full ROM and Muscle Strength 5/5 Right: Decreased ROM 45 Degrees and Muscle Strength 5/5 Lumbar Paraspinal Tenderness: L-4- L-5 Lower Extremities: Full ROM and Muscle Strength 5/5 Arises from chair slowly Using Walker for support Narrow Based gait  Neurological: She is alert and oriented to person, place, and time.  Skin: Skin is warm and dry.  Psychiatric: She has a normal mood and affect.  Nursing note and vitals reviewed.         Assessment & Plan:  1. Left Lumbar 3 radiculopathy/stenosis s/p L3-4 laminectomy and fusion: Refilled:  Oxycodone 10 mg one tablet every 6 hours as needed #75.Second script given to accommodate scheduled appointment. Continue using walker for balance and stability. Scheduled for ESI with Dr. Wynetta Emery in August/ 2016. 2. Diabetes with peripheral neuropathy: Continue with Lidoderm and Voltaren Gel.  30 minutes of face to face patient care time was spent during this visit. All questions were encouraged and answered   F/U in 1 month

## 2015-08-18 ENCOUNTER — Encounter: Payer: Self-pay | Admitting: Physical Medicine & Rehabilitation

## 2015-08-18 ENCOUNTER — Encounter
Payer: Worker's Compensation | Attending: Physical Medicine & Rehabilitation | Admitting: Physical Medicine & Rehabilitation

## 2015-08-18 VITALS — BP 166/60 | HR 65

## 2015-08-18 DIAGNOSIS — M4806 Spinal stenosis, lumbar region: Secondary | ICD-10-CM | POA: Diagnosis not present

## 2015-08-18 DIAGNOSIS — M329 Systemic lupus erythematosus, unspecified: Secondary | ICD-10-CM | POA: Diagnosis not present

## 2015-08-18 DIAGNOSIS — K219 Gastro-esophageal reflux disease without esophagitis: Secondary | ICD-10-CM | POA: Insufficient documentation

## 2015-08-18 DIAGNOSIS — M545 Low back pain: Secondary | ICD-10-CM | POA: Diagnosis present

## 2015-08-18 DIAGNOSIS — M948X9 Other specified disorders of cartilage, unspecified sites: Secondary | ICD-10-CM

## 2015-08-18 DIAGNOSIS — Z889 Allergy status to unspecified drugs, medicaments and biological substances status: Secondary | ICD-10-CM | POA: Diagnosis not present

## 2015-08-18 DIAGNOSIS — M898X9 Other specified disorders of bone, unspecified site: Secondary | ICD-10-CM

## 2015-08-18 DIAGNOSIS — E1140 Type 2 diabetes mellitus with diabetic neuropathy, unspecified: Secondary | ICD-10-CM | POA: Insufficient documentation

## 2015-08-18 DIAGNOSIS — Z5181 Encounter for therapeutic drug level monitoring: Secondary | ICD-10-CM | POA: Diagnosis not present

## 2015-08-18 DIAGNOSIS — M5416 Radiculopathy, lumbar region: Secondary | ICD-10-CM

## 2015-08-18 DIAGNOSIS — I10 Essential (primary) hypertension: Secondary | ICD-10-CM | POA: Diagnosis not present

## 2015-08-18 DIAGNOSIS — Z981 Arthrodesis status: Secondary | ICD-10-CM | POA: Diagnosis not present

## 2015-08-18 DIAGNOSIS — Z79899 Other long term (current) drug therapy: Secondary | ICD-10-CM | POA: Diagnosis not present

## 2015-08-18 DIAGNOSIS — M48061 Spinal stenosis, lumbar region without neurogenic claudication: Secondary | ICD-10-CM

## 2015-08-18 DIAGNOSIS — M25552 Pain in left hip: Secondary | ICD-10-CM | POA: Diagnosis present

## 2015-08-18 DIAGNOSIS — E039 Hypothyroidism, unspecified: Secondary | ICD-10-CM | POA: Insufficient documentation

## 2015-08-18 DIAGNOSIS — E785 Hyperlipidemia, unspecified: Secondary | ICD-10-CM | POA: Insufficient documentation

## 2015-08-18 DIAGNOSIS — S72002S Fracture of unspecified part of neck of left femur, sequela: Secondary | ICD-10-CM

## 2015-08-18 MED ORDER — HYDROCODONE-ACETAMINOPHEN 7.5-325 MG PO TABS: 1.0000 | ORAL_TABLET | Freq: Four times a day (QID) | ORAL | Status: DC | PRN

## 2015-08-18 NOTE — Patient Instructions (Addendum)
GABAPENTIN:    TWICE DAILY FOR 4 DAYS THEN THREE X DAILY THEREAFTER.  THIS MAY NEED FURTHER INCREASE FOR EFFECT. STOP MEDICATION IF YOU EXPERIENCE DIZZINESS OR SEDATION.   PLEASE CALL ME WITH ANY PROBLEMS OR QUESTIONS (#[phone]).  HAVE A GOOD DAY!   PLEASE MAKE SURE YOU TAKE YOUR IBUPROFEN WITH FOOD OR MILK.

## 2015-08-18 NOTE — Progress Notes (Signed)
Subjective:    Patient ID: April [name], female    DOB: 12/25/1925, 81 y.o.   MRN: [id number]  HPI   She has had some relief after a lumbar epidural with pain down to a "5/10" after the first block. Dr. Ranell Patrick is waiting to see how she responds to the epidurals first before he considers hip intervention. She doesn't feel  That she needs the oxycodone at this point as her pain is not as severe. She typically is only using the oxycodone 1-3 x per week. In between she uses advil. She also likes the lidocaine patches.     April [name] daughter says that they were given a second Rx for oxycodone at last visit and it was not filled.  They do not have Rx with them today.  April [name] is for a random UDS today.   Pain Inventory Average Pain 5 Pain Right Now 5 My pain is aching  In the last 24 hours, has pain interfered with the following? General activity 4 Relation with others 2 Enjoyment of life 4 What TIME of day is your pain at its worst? night Sleep (in general) Fair  Pain is worse with: walking Pain improves with: medication Relief from Meds: not answered  Mobility walk with assistance use a walker how many minutes can you walk? 5  Function I need assistance with the following:  bathing, meal prep, household duties and shopping  Neuro/Psych No problems in this area  Prior Studies Any changes since last visit?  no  Physicians involved in your care Any changes since last visit?  no   History reviewed. No pertinent family history. Social History   Social History  . Marital Status: Single    Spouse Name: N/A  . Number of Children: N/A  . Years of Education: N/A   Social History Main Topics  . Smoking status: Never Smoker   . Smokeless tobacco: Never Used  . Alcohol Use: No  . Drug Use: No  . Sexual Activity: No   Other Topics Concern  . None   Social History Narrative   Past Surgical History  Procedure Laterality Date  . Cholecystectomy    . Hip  arthroplasty Left 04/08/2014    Procedure: LEFT HIP HEMIARTHROPLASTY;  Surgeon: Verlee Rossetti, MD;  Location: [facility] OR;  Service: Orthopedics;  Laterality: Left;  . Tonsillectomy    . Adenoidectomy    . Left cataract removed    . Eye surgery     Past Medical History  Diagnosis Date  . Hyperlipidemia 04/07/2014    takes Lovastatin daily  . OA (osteoarthritis) 04/07/2014  . Hypertension     takes Amlodipine and Atenolol daily  . HTN (hypertension) 04/07/2014  . Diabetes mellitus without complication     takes Lantus daily  . Hypothyroidism 04/07/2014    takes Synthroid daily  . History of bronchitis 20 yrs ago  . History of lupus 62yrs ago  . Joint pain   . Peripheral edema   . UTI (lower urinary tract infection)     completed antibioitcs on 07/28/14  . Urinary frequency     takes Vesicare daily  . Chronic back pain     stenosis  . Constipation     takes an OTC stool softener   . GERD (gastroesophageal reflux disease)     not on Protonix daily  . Nocturia   . Cataract     left eye  . History of shingles    BP 166/60  mmHg  Pulse 65  SpO2 95%  Opioid Risk Score:   Fall Risk Score:  `1  Depression screen PHQ 2/9  Depression screen [facility] 2/9 08/18/2015 07/16/2015 03/10/2015  Decreased Interest 0 0 0  Down, Depressed, Hopeless 0 0 0  PHQ - 2 Score 0 0 0  Altered sleeping - - 0  Tired, decreased energy - - 0  Change in appetite - - 0  Feeling bad or failure about yourself  - - 0  Trouble concentrating - - 0  Moving slowly or fidgety/restless - - 0  Suicidal thoughts - - 0  PHQ-9 Score - - 0      Review of Systems  All other systems reviewed and are negative.      Objective:   Physical Exam  Constitutional: She is oriented to person, place, and time. She appears well-developed and well-nourished.  HENT: oral mucosa pink and moist  Head: Normocephalic and atraumatic.  Eyes: Conjunctivae are normal. Pupils are equal, round, and reactive to light.  Neck: Normal range  of motion. Neck supple.  Cardiovascular: Normal rate and regular rhythm. No murmurs or rubs.  Respiratory: Effort normal and breath sounds normal. No respiratory distress. She has no wheezes. No rales  GI: Soft. Bowel sounds are normal. She exhibits no distension. There is no tenderness.  Musculoskeletal: She exhibits mild edema lower ext edema, left more than right  Less antalgia left lower ext, large palpable bone mass along lateral hip---area less tender to touch. Ambulated with walker with good balance and improved weight shift. Has more pain along the left low back and buttock. Neurological: She is alert and oriented to person, place, and time. No cranial nerve deficit. Coordination normal.  Good sitting balance. Quads,KE 5/5. ADF/APF 5/5. UE's grossly 5/5 prox to distal. No focal sensory loss is appreciated however except for mild stocking glove sensory loss in the feet (to ankles.ower leg). Cognitively displays normal insight, awareness, memory.  Skin: Skin is warm and dry.  Psychiatric: She has a normal mood and affect. Her behavior is normal. Thought content normal    Assessment/Plan:  1. Left Lumbar 3 radiculopathy/stenosis s/p L3-4 laminectomy and fusion  2. Diabetes with peripheral neuropathy: likely component of distal, bilateral leg pain  3. Heterotopic bone--left hip---this is likely the biggest component to her pain picture    Plan:   1. continue with walker for now for balance and stability--balancing better  2. Will step her down to hydrocodone 7.5/235mg  with goal of further titration in the near future.   3. Asked her to resume gabapentin,  BID for 4 days then TID.   4. Follow up with Dr. Ranell Patrick and NS regarding left hip and low back. May benefit from a follow up epidural   5. Trial of voltaren gel TID during the day  6. Lidoderm patch to left hip/leg  .    Follow up with me in 2 months. Thirty minutes of face to face patient care time were spent during this visit.  All questions were encouraged and answered. Reviewed with case manager also.

## 2015-09-27 DIAGNOSIS — E1122 Type 2 diabetes mellitus with diabetic chronic kidney disease: Secondary | ICD-10-CM | POA: Diagnosis not present

## 2015-09-27 DIAGNOSIS — I824Z3 Acute embolism and thrombosis of unspecified deep veins of distal lower extremity, bilateral: Secondary | ICD-10-CM | POA: Diagnosis not present

## 2015-09-27 DIAGNOSIS — E039 Hypothyroidism, unspecified: Secondary | ICD-10-CM | POA: Diagnosis not present

## 2015-09-27 DIAGNOSIS — E782 Mixed hyperlipidemia: Secondary | ICD-10-CM | POA: Diagnosis not present

## 2015-09-27 DIAGNOSIS — E559 Vitamin D deficiency, unspecified: Secondary | ICD-10-CM | POA: Diagnosis not present

## 2015-09-27 DIAGNOSIS — N181 Chronic kidney disease, stage 1: Secondary | ICD-10-CM | POA: Diagnosis not present

## 2015-10-04 DIAGNOSIS — E119 Type 2 diabetes mellitus without complications: Secondary | ICD-10-CM | POA: Diagnosis not present

## 2015-10-04 DIAGNOSIS — Z23 Encounter for immunization: Secondary | ICD-10-CM | POA: Diagnosis not present

## 2015-10-04 DIAGNOSIS — E559 Vitamin D deficiency, unspecified: Secondary | ICD-10-CM | POA: Diagnosis not present

## 2015-10-04 DIAGNOSIS — D500 Iron deficiency anemia secondary to blood loss (chronic): Secondary | ICD-10-CM | POA: Diagnosis not present

## 2015-10-04 DIAGNOSIS — I10 Essential (primary) hypertension: Secondary | ICD-10-CM | POA: Diagnosis not present

## 2015-10-04 DIAGNOSIS — E1122 Type 2 diabetes mellitus with diabetic chronic kidney disease: Secondary | ICD-10-CM | POA: Diagnosis not present

## 2015-10-04 DIAGNOSIS — E039 Hypothyroidism, unspecified: Secondary | ICD-10-CM | POA: Diagnosis not present

## 2015-10-04 DIAGNOSIS — M810 Age-related osteoporosis without current pathological fracture: Secondary | ICD-10-CM | POA: Diagnosis not present

## 2015-10-04 DIAGNOSIS — N181 Chronic kidney disease, stage 1: Secondary | ICD-10-CM | POA: Diagnosis not present

## 2015-10-04 DIAGNOSIS — E782 Mixed hyperlipidemia: Secondary | ICD-10-CM | POA: Diagnosis not present

## 2015-10-11 ENCOUNTER — Encounter: Payer: Worker's Compensation | Admitting: Physical Medicine & Rehabilitation

## 2015-10-27 ENCOUNTER — Other Ambulatory Visit: Payer: Self-pay | Admitting: Neurosurgery

## 2015-10-27 DIAGNOSIS — M5137 Other intervertebral disc degeneration, lumbosacral region: Secondary | ICD-10-CM

## 2015-11-02 DIAGNOSIS — E039 Hypothyroidism, unspecified: Secondary | ICD-10-CM | POA: Diagnosis not present

## 2015-11-02 DIAGNOSIS — R350 Frequency of micturition: Secondary | ICD-10-CM | POA: Diagnosis not present

## 2015-11-02 DIAGNOSIS — E782 Mixed hyperlipidemia: Secondary | ICD-10-CM | POA: Diagnosis not present

## 2015-11-02 DIAGNOSIS — I10 Essential (primary) hypertension: Secondary | ICD-10-CM | POA: Diagnosis not present

## 2015-11-02 DIAGNOSIS — N181 Chronic kidney disease, stage 1: Secondary | ICD-10-CM | POA: Diagnosis not present

## 2015-11-02 DIAGNOSIS — E1122 Type 2 diabetes mellitus with diabetic chronic kidney disease: Secondary | ICD-10-CM | POA: Diagnosis not present

## 2015-11-02 DIAGNOSIS — E119 Type 2 diabetes mellitus without complications: Secondary | ICD-10-CM | POA: Diagnosis not present

## 2015-11-02 DIAGNOSIS — N3000 Acute cystitis without hematuria: Secondary | ICD-10-CM | POA: Diagnosis not present

## 2015-11-08 ENCOUNTER — Ambulatory Visit: Payer: Worker's Compensation | Admitting: Physical Medicine & Rehabilitation

## 2015-11-09 ENCOUNTER — Encounter
Payer: Worker's Compensation | Attending: Physical Medicine & Rehabilitation | Admitting: Physical Medicine & Rehabilitation

## 2015-11-09 ENCOUNTER — Encounter: Payer: Self-pay | Admitting: Physical Medicine & Rehabilitation

## 2015-11-09 VITALS — BP 194/66 | HR 65

## 2015-11-09 DIAGNOSIS — K219 Gastro-esophageal reflux disease without esophagitis: Secondary | ICD-10-CM | POA: Insufficient documentation

## 2015-11-09 DIAGNOSIS — M5416 Radiculopathy, lumbar region: Secondary | ICD-10-CM | POA: Diagnosis not present

## 2015-11-09 DIAGNOSIS — M898X9 Other specified disorders of bone, unspecified site: Secondary | ICD-10-CM

## 2015-11-09 DIAGNOSIS — I10 Essential (primary) hypertension: Secondary | ICD-10-CM | POA: Diagnosis not present

## 2015-11-09 DIAGNOSIS — E785 Hyperlipidemia, unspecified: Secondary | ICD-10-CM | POA: Insufficient documentation

## 2015-11-09 DIAGNOSIS — M545 Low back pain: Secondary | ICD-10-CM | POA: Diagnosis present

## 2015-11-09 DIAGNOSIS — Z889 Allergy status to unspecified drugs, medicaments and biological substances status: Secondary | ICD-10-CM | POA: Insufficient documentation

## 2015-11-09 DIAGNOSIS — M329 Systemic lupus erythematosus, unspecified: Secondary | ICD-10-CM | POA: Diagnosis not present

## 2015-11-09 DIAGNOSIS — M4806 Spinal stenosis, lumbar region: Secondary | ICD-10-CM | POA: Diagnosis not present

## 2015-11-09 DIAGNOSIS — E1140 Type 2 diabetes mellitus with diabetic neuropathy, unspecified: Secondary | ICD-10-CM | POA: Insufficient documentation

## 2015-11-09 DIAGNOSIS — S72002S Fracture of unspecified part of neck of left femur, sequela: Secondary | ICD-10-CM | POA: Diagnosis not present

## 2015-11-09 DIAGNOSIS — Z981 Arthrodesis status: Secondary | ICD-10-CM | POA: Diagnosis not present

## 2015-11-09 DIAGNOSIS — M948X9 Other specified disorders of cartilage, unspecified sites: Secondary | ICD-10-CM

## 2015-11-09 DIAGNOSIS — M25552 Pain in left hip: Secondary | ICD-10-CM | POA: Diagnosis present

## 2015-11-09 DIAGNOSIS — E039 Hypothyroidism, unspecified: Secondary | ICD-10-CM | POA: Diagnosis not present

## 2015-11-09 MED ORDER — OXYCODONE HCL 10 MG PO TABS: 10.0000 mg | ORAL_TABLET | Freq: Three times a day (TID) | ORAL | Status: DC | PRN

## 2015-11-09 NOTE — Patient Instructions (Signed)
PLEASE CALL ME WITH ANY PROBLEMS OR QUESTIONS (#[phone]). HAVE A HAPPY HOLIDAY SEASON!!!    

## 2015-11-09 NOTE — Progress Notes (Signed)
Subjective:    Patient ID: April [name], female    DOB: March 03, 1925, 81 y.o.   MRN: [id number]  HPI   April [name] is here in follow up of her chronic pain. She had two more ESI's after I last saw her. Dr. Ranell Patrick feels that her pain is coming from her heterotopic bone bone in her hips. He has signed off. She is still having a lot of pain in the left hip with weight bearing and most activity.   She doesn't like the hydrocodone as it doesn't really help much. The oxycodone has been much more effective. Also the ibuprofen helps---she takes  twice daily.   She is utilizing her rolling walker for balance. Family is available for support.     Pain Inventory Average Pain 5 Pain Right Now 5 My pain is dull and tingling  In the last 24 hours, has pain interfered with the following? General activity 0 Relation with others 0 Enjoyment of life 0 What TIME of day is your pain at its worst? evening Sleep (in general) Fair  Pain is worse with: walking Pain improves with: medication Relief from Meds: 0  Mobility walk with assistance use a walker how many minutes can you walk? 10 ability to climb steps?  no do you drive?  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  no  Physicians involved in your care Any changes since last visit?  no   History reviewed. No pertinent family history. Social History   Social History  . Marital Status: Single    Spouse Name: N/A  . Number of Children: N/A  . Years of Education: N/A   Social History Main Topics  . Smoking status: Never Smoker   . Smokeless tobacco: Never Used  . Alcohol Use: No  . Drug Use: No  . Sexual Activity: No   Other Topics Concern  . None   Social History Narrative   Past Surgical History  Procedure Laterality Date  . Cholecystectomy    . Hip arthroplasty Left 04/08/2014   Procedure: LEFT HIP HEMIARTHROPLASTY;  Surgeon: Verlee Rossetti, MD;  Location: [facility] OR;  Service: Orthopedics;  Laterality: Left;  . Tonsillectomy    . Adenoidectomy    . Left cataract removed    . Eye surgery     Past Medical History  Diagnosis Date  . Hyperlipidemia 04/07/2014    takes Lovastatin daily  . OA (osteoarthritis) 04/07/2014  . Hypertension     takes Amlodipine and Atenolol daily  . HTN (hypertension) 04/07/2014  . Diabetes mellitus without complication (HCC)     takes Lantus daily  . Hypothyroidism 04/07/2014    takes Synthroid daily  . History of bronchitis 20 yrs ago  . History of lupus 52yrs ago  . Joint pain   . Peripheral edema   . UTI (lower urinary tract infection)     completed antibioitcs on 07/28/14  . Urinary frequency     takes Vesicare daily  . Chronic back pain     stenosis  . Constipation     takes an OTC stool softener   . GERD (gastroesophageal reflux disease)     not on Protonix daily  . Nocturia   . Cataract     left eye  . History of shingles    BP 194/66 mmHg  Pulse 65  SpO2 95%  Opioid Risk Score:  Fall Risk Score:  `1  Depression screen PHQ 2/9  [facility] 08/18/2015 07/16/2015 03/10/2015  Decreased Interest 0 0 0  Down, Depressed, Hopeless 0 0 0  PHQ - 2 Score 0 0 0  Altered sleeping - - 0  Tired, decreased energy - - 0  Change in appetite - - 0  Feeling bad or failure about yourself  - - 0  Trouble concentrating - - 0  Moving slowly or fidgety/restless - - 0  Suicidal thoughts - - 0  PHQ-9 Score - - 0      Review of Systems  Genitourinary:       Bladder control problems  All other systems reviewed and are negative.      Objective:   Physical Exam  Constitutional: She is oriented to person, place, and time. She appears well-developed and well-nourished.  HENT: oral mucosa pink and moist  Head: Normocephalic and atraumatic.  Eyes: Conjunctivae are normal. Pupils are equal, round, and reactive to  light.  Neck: Normal range of motion. Neck supple.  Cardiovascular: Normal rate and regular rhythm. No murmurs or rubs.  Respiratory: Effort normal and breath sounds normal. No respiratory distress. She has no wheezes. No rales  GI: Soft. Bowel sounds are normal. She exhibits no distension. There is no tenderness.  Musculoskeletal: She exhibits mild edema lower ext edema, left more than right  Less antalgia left lower ext, large palpable bone mass along lateral hip---area less tender to touch. Area is tender to touch.  Ambulated with walker with reasonable balance and improved weight shift.   Neurological: She is alert and oriented to person, place, and time. No cranial nerve deficit. Coordination normal.  Good sitting balance. Quads,KE 5/5. ADF/APF 5/5. UE's grossly 5/5 prox to distal. No focal sensory loss is appreciated however except for mild stocking glove sensory loss in the feet (to ankles.ower leg). Cognitively displays normal insight, awareness, memory.  Skin: Skin is warm and dry.  Psychiatric: She has a normal mood and affect. Her behavior is normal. Thought content normal    Assessment/Plan:  1. Left Lumbar 3 radiculopathy/stenosis s/p L3-4 laminectomy and fusion  2. Diabetes with peripheral neuropathy: likely component of distal, bilateral leg pain  3. Heterotopic bone--left hip---this is likely the biggest component to her pain picture    Plan:  1. Continue with walker for the immediate future for balance and stability   2. Oxycodone for breakthrough pain. Stop vicodin. Wrote for oxycodone 10mg  #60  3. Stop gabapentin  4. Will refer for therapy to address modalities for left hip. TENS may be helpful  5. Ibuprofen is fine---just make sure she akes with food.  6. Lidoderm patch to left hip/leg   .   Follow up with me in 2 months. Thirty minutes of face to face patient care time were spent during this visit. All questions were encouraged and answered. Reviewed with case manager  also.

## 2015-11-16 ENCOUNTER — Telehealth: Payer: Self-pay

## 2015-11-16 NOTE — Telephone Encounter (Signed)
Prior authorization request done for lidoderm patches via cover my meds, waiting response.

## 2015-12-07 ENCOUNTER — Ambulatory Visit
Admission: RE | Admit: 2015-12-07 | Discharge: 2015-12-07 | Disposition: A | Discharge status: Home / Self Care | Payer: Worker's Compensation | Source: Ambulatory Visit | Attending: Neurosurgery | Admitting: Neurosurgery

## 2015-12-07 VITALS — BP 140/56 | HR 67

## 2015-12-07 DIAGNOSIS — M5137 Other intervertebral disc degeneration, lumbosacral region: Secondary | ICD-10-CM

## 2015-12-07 DIAGNOSIS — M48061 Spinal stenosis, lumbar region without neurogenic claudication: Secondary | ICD-10-CM

## 2015-12-07 DIAGNOSIS — M5416 Radiculopathy, lumbar region: Secondary | ICD-10-CM

## 2015-12-07 MED ORDER — DIAZEPAM 5 MG PO TABS
5.0000 mg | ORAL_TABLET | Freq: Once | ORAL | Status: AC
  Administered 2015-12-07: 5 mg via ORAL

## 2015-12-07 MED ORDER — IOHEXOL 180 MG/ML  SOLN
15.0000 mL | Freq: Once | INTRAMUSCULAR | Status: AC | PRN
  Administered 2015-12-07: 15 mL via INTRATHECAL

## 2015-12-07 NOTE — Discharge Instructions (Signed)

## 2015-12-08 DIAGNOSIS — E119 Type 2 diabetes mellitus without complications: Secondary | ICD-10-CM | POA: Diagnosis not present

## 2015-12-08 DIAGNOSIS — E039 Hypothyroidism, unspecified: Secondary | ICD-10-CM | POA: Diagnosis not present

## 2015-12-08 DIAGNOSIS — Z794 Long term (current) use of insulin: Secondary | ICD-10-CM | POA: Diagnosis not present

## 2015-12-08 DIAGNOSIS — E785 Hyperlipidemia, unspecified: Secondary | ICD-10-CM | POA: Diagnosis not present

## 2015-12-08 DIAGNOSIS — I10 Essential (primary) hypertension: Secondary | ICD-10-CM | POA: Diagnosis not present

## 2016-01-06 ENCOUNTER — Emergency Department (HOSPITAL_COMMUNITY)
Admission: EM | Admit: 2016-01-06 | Discharge: 2016-01-06 | Discharge status: Against Medical Advice | Payer: Medicare HMO | Attending: Emergency Medicine | Admitting: Emergency Medicine

## 2016-01-06 ENCOUNTER — Encounter: Payer: Self-pay | Admitting: Registered Nurse

## 2016-01-06 ENCOUNTER — Encounter (HOSPITAL_COMMUNITY): Payer: Self-pay | Admitting: Nurse Practitioner

## 2016-01-06 ENCOUNTER — Encounter: Payer: Worker's Compensation | Attending: Physical Medicine & Rehabilitation | Admitting: Registered Nurse

## 2016-01-06 VITALS — BP 207/95 | HR 61

## 2016-01-06 DIAGNOSIS — M329 Systemic lupus erythematosus, unspecified: Secondary | ICD-10-CM | POA: Diagnosis not present

## 2016-01-06 DIAGNOSIS — Z5181 Encounter for therapeutic drug level monitoring: Secondary | ICD-10-CM | POA: Diagnosis not present

## 2016-01-06 DIAGNOSIS — G8929 Other chronic pain: Secondary | ICD-10-CM | POA: Insufficient documentation

## 2016-01-06 DIAGNOSIS — E785 Hyperlipidemia, unspecified: Secondary | ICD-10-CM | POA: Insufficient documentation

## 2016-01-06 DIAGNOSIS — I10 Essential (primary) hypertension: Secondary | ICD-10-CM | POA: Diagnosis not present

## 2016-01-06 DIAGNOSIS — E039 Hypothyroidism, unspecified: Secondary | ICD-10-CM | POA: Insufficient documentation

## 2016-01-06 DIAGNOSIS — M25552 Pain in left hip: Secondary | ICD-10-CM | POA: Diagnosis present

## 2016-01-06 DIAGNOSIS — E119 Type 2 diabetes mellitus without complications: Secondary | ICD-10-CM | POA: Diagnosis not present

## 2016-01-06 DIAGNOSIS — M4806 Spinal stenosis, lumbar region: Secondary | ICD-10-CM | POA: Insufficient documentation

## 2016-01-06 DIAGNOSIS — M948X9 Other specified disorders of cartilage, unspecified sites: Secondary | ICD-10-CM

## 2016-01-06 DIAGNOSIS — Z889 Allergy status to unspecified drugs, medicaments and biological substances status: Secondary | ICD-10-CM | POA: Diagnosis not present

## 2016-01-06 DIAGNOSIS — Z79899 Other long term (current) drug therapy: Secondary | ICD-10-CM

## 2016-01-06 DIAGNOSIS — Z981 Arthrodesis status: Secondary | ICD-10-CM | POA: Insufficient documentation

## 2016-01-06 DIAGNOSIS — S72002S Fracture of unspecified part of neck of left femur, sequela: Secondary | ICD-10-CM | POA: Diagnosis not present

## 2016-01-06 DIAGNOSIS — M5416 Radiculopathy, lumbar region: Secondary | ICD-10-CM | POA: Diagnosis not present

## 2016-01-06 DIAGNOSIS — M898X9 Other specified disorders of bone, unspecified site: Secondary | ICD-10-CM

## 2016-01-06 DIAGNOSIS — E1140 Type 2 diabetes mellitus with diabetic neuropathy, unspecified: Secondary | ICD-10-CM | POA: Insufficient documentation

## 2016-01-06 DIAGNOSIS — K219 Gastro-esophageal reflux disease without esophagitis: Secondary | ICD-10-CM | POA: Insufficient documentation

## 2016-01-06 DIAGNOSIS — M545 Low back pain: Secondary | ICD-10-CM | POA: Diagnosis present

## 2016-01-06 MED ORDER — OXYCODONE HCL 10 MG PO TABS: 10.0000 mg | ORAL_TABLET | Freq: Three times a day (TID) | ORAL | Status: DC | PRN

## 2016-01-06 NOTE — ED Notes (Signed)
Patient called multiple areas of ED waiting room. No answer.

## 2016-01-06 NOTE — ED Notes (Addendum)
She went for routine check up with dr swords today and they were concerned due to her elevated BP in the office. She has been taking her BP meds as prescribed . She reports no complaints, although she has had cold symptoms this week but she was only going to PCP today for a routine check up. She is A&Ox4, resp e/u

## 2016-01-06 NOTE — ED Notes (Signed)
Patient called for room x3  With no response

## 2016-01-06 NOTE — Progress Notes (Addendum)
Subjective:    Patient ID: April [name], female    DOB: 11/09/1925, 82 y.o.   MRN: [id number]  HPI: April [name] is a 82 year old female who returns for follow up appointment and medication refill. She says her pain is located in her lower back radiating into his left hip and lower extremity anteriorly. She rates her pain 5. Her current exercise regime is walking in her home and attending physical therapy twice a week.  April [name] arrived to office hypertensive 202/92 rechecked several times 199/92, 231/108, 207/95. April [name] refused ED evaluation. I placed a call to Dr. Leandrew Koyanagi her PCP spoke to his nurse Albin Felling, she states Dr. Leandrew Koyanagi wanted April [name] to go to the ED. Manual blood pressure checked at 12:10 210/110.Spoke with April [name] and her daughter in detail regarding hypertension. April [name] decided to go to [facility] ED, refused EMS her daughter will escort her to the ED. April [name] transferred to wheelchair and escorted to the car.  Daughter in room all questions answered.  Pain Inventory Average Pain 5 Pain Right Now 5 My pain is tingling and aching  In the last 24 hours, has pain interfered with the following? General activity 5 Relation with others 5 Enjoyment of life 5 What TIME of day is your pain at its worst? evening Sleep (in general) Fair  Pain is worse with: walking and some activites Pain improves with: medication Relief from Meds: 5  Mobility use a walker  Function retired  Neuro/Psych bladder control problems  Prior Studies Any changes since last visit?  no  Physicians involved in your care Any changes since last visit?  no   History reviewed. No pertinent family history. Social History   Social History  . Marital Status: Single    Spouse Name: N/A  . Number of Children: N/A  . Years of Education: N/A   Social History Main Topics  . Smoking status: Never Smoker   . Smokeless tobacco: Never Used  . Alcohol Use: No   . Drug Use: No  . Sexual Activity: No   Other Topics Concern  . None   Social History Narrative   Past Surgical History  Procedure Laterality Date  . Cholecystectomy    . Hip arthroplasty Left 04/08/2014    Procedure: LEFT HIP HEMIARTHROPLASTY;  Surgeon: Verlee Rossetti, MD;  Location: [facility] OR;  Service: Orthopedics;  Laterality: Left;  . Tonsillectomy    . Adenoidectomy    . Left cataract removed    . Eye surgery     Past Medical History  Diagnosis Date  . Hyperlipidemia 04/07/2014    takes Lovastatin daily  . OA (osteoarthritis) 04/07/2014  . Hypertension     takes Amlodipine and Atenolol daily  . HTN (hypertension) 04/07/2014  . Diabetes mellitus without complication (HCC)     takes Lantus daily  . Hypothyroidism 04/07/2014    takes Synthroid daily  . History of bronchitis 20 yrs ago  . History of lupus 19yrs ago  . Joint pain   . Peripheral edema   . UTI (lower urinary tract infection)     completed antibioitcs on 07/28/14  . Urinary frequency     takes Vesicare daily  . Chronic back pain     stenosis  . Constipation     takes an OTC stool softener   . GERD (gastroesophageal reflux disease)     not on Protonix daily  . Nocturia   . Cataract  left eye  . History of shingles    BP 202/92 mmHg  Pulse 65  SpO2 95%  Opioid Risk Score:   Fall Risk Score:  `1  Depression screen PHQ 2/9  [facility] 01/06/2016 08/18/2015 07/16/2015 03/10/2015  Decreased Interest 0 0 0 0  Down, Depressed, Hopeless 0 0 0 0  PHQ - 2 Score 0 0 0 0  Altered sleeping - - - 0  Tired, decreased energy - - - 0  Change in appetite - - - 0  Feeling bad or failure about yourself  - - - 0  Trouble concentrating - - - 0  Moving slowly or fidgety/restless - - - 0  Suicidal thoughts - - - 0  PHQ-9 Score - - - 0     Review of Systems  All other systems reviewed and are negative.      Objective:   Physical Exam  Constitutional: She is oriented to person, place, and  time. She appears well-developed and well-nourished.  HENT:  Head: Normocephalic and atraumatic.  Neck: Normal range of motion. Neck supple.  Cardiovascular: Normal rate and regular rhythm.   Pulmonary/Chest: Effort normal and breath sounds normal.  Musculoskeletal:  Normal Muscle Bulk and Muscle Testing Reveals: Upper Extremities: Left: Full ROM and Muscle Strength 5/5 Right: Decreased ROM 45 Degrees Lumbar Paraspinal Tenderness: L-3- L-5 Mainly Left Side Left Greater Trochanteric Tenderness Lower Extremities: Full ROM and Muscle Strength 5/5 Left Lower Extremity Flexion Produces Pain into Left Hip and Lower Extremity Arrived walking with walker Transferred to wheelchair  Neurological: She is alert and oriented to person, place, and time.  Skin: Skin is warm and dry.  Psychiatric: She has a normal mood and affect.  Nursing note and vitals reviewed.         Assessment & Plan:  1. Left Lumbar 3 radiculopathy/stenosis s/p L3-4 laminectomy and fusion: Refilled: Oxycodone 10 mg one tablet every 8 hours as needed # 60. Continue with Physical Therapy and HEP using walker for balance and stability. 2. Diabetes with peripheral neuropathy: Continue with Lidoderm and Voltaren Gel. 3. [facility] 45 minutes of face to face patient care time was spent during this visit. All questions were encouraged and answered   F/U in 1 month

## 2016-01-06 NOTE — ED Notes (Addendum)
Patient called 3X for vital signs reassessment with no response 

## 2016-01-10 DIAGNOSIS — E039 Hypothyroidism, unspecified: Secondary | ICD-10-CM | POA: Diagnosis not present

## 2016-01-10 DIAGNOSIS — E1122 Type 2 diabetes mellitus with diabetic chronic kidney disease: Secondary | ICD-10-CM | POA: Diagnosis not present

## 2016-01-10 DIAGNOSIS — M810 Age-related osteoporosis without current pathological fracture: Secondary | ICD-10-CM | POA: Diagnosis not present

## 2016-01-10 DIAGNOSIS — E119 Type 2 diabetes mellitus without complications: Secondary | ICD-10-CM | POA: Diagnosis not present

## 2016-01-10 DIAGNOSIS — I10 Essential (primary) hypertension: Secondary | ICD-10-CM | POA: Diagnosis not present

## 2016-02-08 DIAGNOSIS — L719 Rosacea, unspecified: Secondary | ICD-10-CM | POA: Diagnosis not present

## 2016-02-08 DIAGNOSIS — L570 Actinic keratosis: Secondary | ICD-10-CM | POA: Diagnosis not present

## 2016-03-06 ENCOUNTER — Encounter: Payer: Self-pay | Admitting: Registered Nurse

## 2016-03-06 ENCOUNTER — Encounter: Payer: Worker's Compensation | Attending: Physical Medicine & Rehabilitation | Admitting: Registered Nurse

## 2016-03-06 VITALS — BP 152/67 | HR 60 | Resp 14

## 2016-03-06 DIAGNOSIS — M5416 Radiculopathy, lumbar region: Secondary | ICD-10-CM | POA: Insufficient documentation

## 2016-03-06 DIAGNOSIS — I10 Essential (primary) hypertension: Secondary | ICD-10-CM | POA: Diagnosis not present

## 2016-03-06 DIAGNOSIS — Z981 Arthrodesis status: Secondary | ICD-10-CM | POA: Diagnosis not present

## 2016-03-06 DIAGNOSIS — E1140 Type 2 diabetes mellitus with diabetic neuropathy, unspecified: Secondary | ICD-10-CM | POA: Diagnosis not present

## 2016-03-06 DIAGNOSIS — M948X9 Other specified disorders of cartilage, unspecified sites: Secondary | ICD-10-CM

## 2016-03-06 DIAGNOSIS — Z79899 Other long term (current) drug therapy: Secondary | ICD-10-CM

## 2016-03-06 DIAGNOSIS — E785 Hyperlipidemia, unspecified: Secondary | ICD-10-CM | POA: Diagnosis not present

## 2016-03-06 DIAGNOSIS — M329 Systemic lupus erythematosus, unspecified: Secondary | ICD-10-CM | POA: Insufficient documentation

## 2016-03-06 DIAGNOSIS — M898X9 Other specified disorders of bone, unspecified site: Secondary | ICD-10-CM

## 2016-03-06 DIAGNOSIS — Z5181 Encounter for therapeutic drug level monitoring: Secondary | ICD-10-CM

## 2016-03-06 DIAGNOSIS — M4806 Spinal stenosis, lumbar region: Secondary | ICD-10-CM | POA: Insufficient documentation

## 2016-03-06 DIAGNOSIS — K219 Gastro-esophageal reflux disease without esophagitis: Secondary | ICD-10-CM | POA: Insufficient documentation

## 2016-03-06 DIAGNOSIS — M545 Low back pain: Secondary | ICD-10-CM | POA: Diagnosis present

## 2016-03-06 DIAGNOSIS — G894 Chronic pain syndrome: Secondary | ICD-10-CM | POA: Diagnosis not present

## 2016-03-06 DIAGNOSIS — E039 Hypothyroidism, unspecified: Secondary | ICD-10-CM | POA: Insufficient documentation

## 2016-03-06 DIAGNOSIS — M25552 Pain in left hip: Secondary | ICD-10-CM | POA: Diagnosis present

## 2016-03-06 DIAGNOSIS — S72002S Fracture of unspecified part of neck of left femur, sequela: Secondary | ICD-10-CM

## 2016-03-06 DIAGNOSIS — Z889 Allergy status to unspecified drugs, medicaments and biological substances status: Secondary | ICD-10-CM | POA: Diagnosis not present

## 2016-03-06 MED ORDER — OXYCODONE HCL 10 MG PO TABS: 10.0000 mg | ORAL_TABLET | Freq: Three times a day (TID) | ORAL | Status: DC | PRN

## 2016-03-06 NOTE — Progress Notes (Signed)
Subjective:    Patient ID: April [name], female    DOB: Nov 10, 1925, 82 y.o.   MRN: [id number]  HPI: Ms. April [name] is a 82 year old female who returns for follow up appointment and medication refill. She states her pain is located in her left hip and left lower extremity. She rates her pain 5. Her current exercise regime is walking in her home and attending physical therapy twice a week.  Daughter in room.   Pain Inventory Average Pain 5 Pain Right Now 5 My pain is tingling  In the last 24 hours, has pain interfered with the following? General activity 3 Relation with others 3 Enjoyment of life 3 What TIME of day is your pain at its worst? night Sleep (in general) Fair  Pain is worse with: walking, bending and standing Pain improves with: rest, heat/ice and medication Relief from Meds: 5  Mobility walk with assistance use a walker  Function disabled: date disabled 04/07/14  Neuro/Psych bladder control problems trouble walking  Prior Studies Any changes since last visit?  no  Physicians involved in your care Any changes since last visit?  no   History reviewed. No pertinent family history. Social History   Social History  . Marital Status: Single    Spouse Name: N/A  . Number of Children: N/A  . Years of Education: N/A   Social History Main Topics  . Smoking status: Never Smoker   . Smokeless tobacco: Never Used  . Alcohol Use: No  . Drug Use: No  . Sexual Activity: No   Other Topics Concern  . None   Social History Narrative   Past Surgical History  Procedure Laterality Date  . Cholecystectomy    . Hip arthroplasty Left 04/08/2014    Procedure: LEFT HIP HEMIARTHROPLASTY;  Surgeon: Verlee Rossetti, MD;  Location: [facility] OR;  Service: Orthopedics;  Laterality: Left;  . Tonsillectomy    . Adenoidectomy    . Left cataract removed    . Eye surgery     Past Medical History  Diagnosis Date  . Hyperlipidemia 04/07/2014    takes Lovastatin  daily  . OA (osteoarthritis) 04/07/2014  . Hypertension     takes Amlodipine and Atenolol daily  . HTN (hypertension) 04/07/2014  . Diabetes mellitus without complication (HCC)     takes Lantus daily  . Hypothyroidism 04/07/2014    takes Synthroid daily  . History of bronchitis 20 yrs ago  . History of lupus 58yrs ago  . Joint pain   . Peripheral edema   . UTI (lower urinary tract infection)     completed antibioitcs on 07/28/14  . Urinary frequency     takes Vesicare daily  . Chronic back pain     stenosis  . Constipation     takes an OTC stool softener   . GERD (gastroesophageal reflux disease)     not on Protonix daily  . Nocturia   . Cataract     left eye  . History of shingles    BP 160/80 mmHg  Pulse 53  Resp 14  SpO2 96%  Opioid Risk Score:   Fall Risk Score:  `1  Depression screen PHQ 2/9  Depression screen [facility] 2/9 01/06/2016 08/18/2015 07/16/2015 03/10/2015  Decreased Interest 0 0 0 0  Down, Depressed, Hopeless 0 0 0 0  PHQ - 2 Score 0 0 0 0  Altered sleeping - - - 0  Tired, decreased energy - - - 0  Change in appetite - - - 0  Feeling bad or failure about yourself  - - - 0  Trouble concentrating - - - 0  Moving slowly or fidgety/restless - - - 0  Suicidal thoughts - - - 0  PHQ-9 Score - - - 0     Review of Systems  Constitutional:       Bladder control problems   Musculoskeletal: Positive for gait problem.  All other systems reviewed and are negative.      Objective:   Physical Exam  Constitutional: She is oriented to person, place, and time. She appears well-developed and well-nourished.  HENT:  Head: Normocephalic and atraumatic.  Neck: Normal range of motion. Neck supple.  Cardiovascular: Normal rate and regular rhythm.   Pulmonary/Chest: Effort normal and breath sounds normal.  Musculoskeletal:  Normal Muscle Bulk and Muscle Testing Reveals: Upper Extremities: Right: Decreased ROM 45 Degrees and Muscle Strength 4/5 Left: Decreased ROM 90  Degrees and Muscle Strength 5/5 Lumbar Paraspinal Tenderness: L-3- L-5 Left Greater Trochanteric Tenderness Lower Extremities: Full ROM and Muscle Strength 5/5 Left Lower Extremity Flexion Produces Pain into extremity Arises from chair slowly Using walker for support    Neurological: She is alert and oriented to person, place, and time.  Skin: Skin is warm and dry.  Psychiatric: She has a normal mood and affect.  Nursing note and vitals reviewed.         Assessment & Plan:  1. Left Lumbar 3 radiculopathy/stenosis s/p L3-4 laminectomy and fusion: Refilled: Oxycodone 10 mg one tablet every 8 hours as needed # 60. Continue with Physical Therapy and HEP using walker for balance and stability. 2. Diabetes with peripheral neuropathy: Continue with Lidoderm and Voltaren Gel.  20 minutes of face to face patient care time was spent during this visit. All questions were encouraged and answered   F/U in 2 month

## 2016-03-11 LAB — TOXASSURE SELECT,+ANTIDEPR,UR: PDF: 0

## 2016-03-16 NOTE — Progress Notes (Signed)
Urine drug screen for this encounter is consistent for prescribed medication 

## 2016-03-27 DIAGNOSIS — E559 Vitamin D deficiency, unspecified: Secondary | ICD-10-CM | POA: Diagnosis not present

## 2016-03-27 DIAGNOSIS — I10 Essential (primary) hypertension: Secondary | ICD-10-CM | POA: Diagnosis not present

## 2016-03-27 DIAGNOSIS — E1122 Type 2 diabetes mellitus with diabetic chronic kidney disease: Secondary | ICD-10-CM | POA: Diagnosis not present

## 2016-03-27 DIAGNOSIS — N181 Chronic kidney disease, stage 1: Secondary | ICD-10-CM | POA: Diagnosis not present

## 2016-03-27 DIAGNOSIS — I824Z3 Acute embolism and thrombosis of unspecified deep veins of distal lower extremity, bilateral: Secondary | ICD-10-CM | POA: Diagnosis not present

## 2016-03-27 DIAGNOSIS — E782 Mixed hyperlipidemia: Secondary | ICD-10-CM | POA: Diagnosis not present

## 2016-03-27 DIAGNOSIS — E039 Hypothyroidism, unspecified: Secondary | ICD-10-CM | POA: Diagnosis not present

## 2016-03-27 DIAGNOSIS — D500 Iron deficiency anemia secondary to blood loss (chronic): Secondary | ICD-10-CM | POA: Diagnosis not present

## 2016-04-05 DIAGNOSIS — I10 Essential (primary) hypertension: Secondary | ICD-10-CM | POA: Diagnosis not present

## 2016-04-05 DIAGNOSIS — M810 Age-related osteoporosis without current pathological fracture: Secondary | ICD-10-CM | POA: Diagnosis not present

## 2016-04-05 DIAGNOSIS — N181 Chronic kidney disease, stage 1: Secondary | ICD-10-CM | POA: Diagnosis not present

## 2016-04-05 DIAGNOSIS — E559 Vitamin D deficiency, unspecified: Secondary | ICD-10-CM | POA: Diagnosis not present

## 2016-04-05 DIAGNOSIS — D500 Iron deficiency anemia secondary to blood loss (chronic): Secondary | ICD-10-CM | POA: Diagnosis not present

## 2016-04-05 DIAGNOSIS — E039 Hypothyroidism, unspecified: Secondary | ICD-10-CM | POA: Diagnosis not present

## 2016-04-05 DIAGNOSIS — E782 Mixed hyperlipidemia: Secondary | ICD-10-CM | POA: Diagnosis not present

## 2016-04-05 DIAGNOSIS — E1122 Type 2 diabetes mellitus with diabetic chronic kidney disease: Secondary | ICD-10-CM | POA: Diagnosis not present

## 2016-05-02 ENCOUNTER — Encounter: Payer: Self-pay | Admitting: Registered Nurse

## 2016-05-02 ENCOUNTER — Encounter: Payer: Worker's Compensation | Attending: Physical Medicine & Rehabilitation | Admitting: Registered Nurse

## 2016-05-02 VITALS — BP 150/77 | HR 60 | Resp 14

## 2016-05-02 DIAGNOSIS — M329 Systemic lupus erythematosus, unspecified: Secondary | ICD-10-CM | POA: Diagnosis not present

## 2016-05-02 DIAGNOSIS — Z5181 Encounter for therapeutic drug level monitoring: Secondary | ICD-10-CM | POA: Diagnosis not present

## 2016-05-02 DIAGNOSIS — K219 Gastro-esophageal reflux disease without esophagitis: Secondary | ICD-10-CM | POA: Insufficient documentation

## 2016-05-02 DIAGNOSIS — G894 Chronic pain syndrome: Secondary | ICD-10-CM | POA: Diagnosis not present

## 2016-05-02 DIAGNOSIS — E039 Hypothyroidism, unspecified: Secondary | ICD-10-CM | POA: Insufficient documentation

## 2016-05-02 DIAGNOSIS — Z981 Arthrodesis status: Secondary | ICD-10-CM | POA: Insufficient documentation

## 2016-05-02 DIAGNOSIS — M4806 Spinal stenosis, lumbar region: Secondary | ICD-10-CM | POA: Diagnosis not present

## 2016-05-02 DIAGNOSIS — M5416 Radiculopathy, lumbar region: Secondary | ICD-10-CM | POA: Insufficient documentation

## 2016-05-02 DIAGNOSIS — M898X9 Other specified disorders of bone, unspecified site: Secondary | ICD-10-CM

## 2016-05-02 DIAGNOSIS — E1140 Type 2 diabetes mellitus with diabetic neuropathy, unspecified: Secondary | ICD-10-CM | POA: Diagnosis not present

## 2016-05-02 DIAGNOSIS — Z889 Allergy status to unspecified drugs, medicaments and biological substances status: Secondary | ICD-10-CM | POA: Diagnosis not present

## 2016-05-02 DIAGNOSIS — E785 Hyperlipidemia, unspecified: Secondary | ICD-10-CM | POA: Insufficient documentation

## 2016-05-02 DIAGNOSIS — M948X9 Other specified disorders of cartilage, unspecified sites: Secondary | ICD-10-CM

## 2016-05-02 DIAGNOSIS — M545 Low back pain: Secondary | ICD-10-CM | POA: Diagnosis present

## 2016-05-02 DIAGNOSIS — M25552 Pain in left hip: Secondary | ICD-10-CM | POA: Diagnosis present

## 2016-05-02 DIAGNOSIS — I10 Essential (primary) hypertension: Secondary | ICD-10-CM | POA: Diagnosis not present

## 2016-05-02 DIAGNOSIS — S72002S Fracture of unspecified part of neck of left femur, sequela: Secondary | ICD-10-CM

## 2016-05-02 DIAGNOSIS — Z79899 Other long term (current) drug therapy: Secondary | ICD-10-CM

## 2016-05-02 MED ORDER — OXYCODONE HCL 10 MG PO TABS: 10.0000 mg | ORAL_TABLET | Freq: Three times a day (TID) | ORAL | Status: DC | PRN

## 2016-05-02 NOTE — Progress Notes (Signed)
Subjective:    Patient ID: April [name], female    DOB: November 16, 1925, 82 y.o.   MRN: [id number]  HPI: April [name] is a 82 year old female who returns for follow up appointment and medication refill. She states her pain is located in her lower back radiating into her left hip and left lower extremity laterally. She rates her pain 4. Also states she has noticed some tingling in her left lower extremity, we will continue to observe at this time, she verbalizes understanding.Her current exercise regime is walking in her home and attending physical therapy twice a week. Also states she has three sessions of physical therapy left.  April [name] Compensation worker  ( Tora Duck Long, RN) states " Ms. Intriago needs to be seen by Dr. Riley Kill, they need a maintenance treatment plan initiated. Also states the treatment plan has to be done by the physician. Ms. Gaynor usually returns in two months due to the above she will return in a month. She verbalizes understanding.  Daughter in room all questions answered.  Pain Inventory Average Pain 4 Pain Right Now 4 My pain is burning, tingling and aching  In the last 24 hours, has pain interfered with the following? General activity NA Relation with others NA Enjoyment of life NA What TIME of day is your pain at its worst? NA Sleep (in general) NA  Pain is worse with: walking and standing Pain improves with: NA Relief from Meds: 5  Mobility walk with assistance use a walker  Function I need assistance with the following:  meal prep, household duties and shopping  Neuro/Psych bladder control problems  Prior Studies Any changes since last visit?  no  Physicians involved in your care Any changes since last visit?  no   History reviewed. No pertinent family history. Social History   Social History  . Marital Status: Single    Spouse Name: N/A  . Number of Children: N/A  . Years of Education: N/A   Social  History Main Topics  . Smoking status: Never Smoker   . Smokeless tobacco: Never Used  . Alcohol Use: No  . Drug Use: No  . Sexual Activity: No   Other Topics Concern  . None   Social History Narrative   Past Surgical History  Procedure Laterality Date  . Cholecystectomy    . Hip arthroplasty Left 04/08/2014    Procedure: LEFT HIP HEMIARTHROPLASTY;  Surgeon: Verlee Rossetti, MD;  Location: [name] OR;  Service: Orthopedics;  Laterality: Left;  . Tonsillectomy    . Adenoidectomy    . Left cataract removed    . Eye surgery     Past Medical History  Diagnosis Date  . Hyperlipidemia 04/07/2014    takes Lovastatin daily  . OA (osteoarthritis) 04/07/2014  . Hypertension     takes Amlodipine and Atenolol daily  . HTN (hypertension) 04/07/2014  . Diabetes mellitus without complication (HCC)     takes Lantus daily  . Hypothyroidism 04/07/2014    takes Synthroid daily  . History of bronchitis 20 yrs ago  . History of lupus 81yrs ago  . Joint pain   . Peripheral edema   . UTI (lower urinary tract infection)     completed antibioitcs on 07/28/14  . Urinary frequency     takes Vesicare daily  . Chronic back pain     stenosis  . Constipation     takes an OTC stool softener   . GERD (gastroesophageal reflux  disease)     not on Protonix daily  . Nocturia   . Cataract     left eye  . History of shingles    BP 150/77 mmHg  Pulse 59  Resp 14  SpO2 97%  Opioid Risk Score:   Fall Risk Score:  `1  Depression screen PHQ 2/9  Depression screen [facility] 2/9 03/06/2016 01/06/2016 08/18/2015 07/16/2015 03/10/2015  Decreased Interest 1 0 0 0 0  Down, Depressed, Hopeless 0 0 0 0 0  PHQ - 2 Score 1 0 0 0 0  Altered sleeping - - - - 0  Tired, decreased energy - - - - 0  Change in appetite - - - - 0  Feeling bad or failure about yourself  - - - - 0  Trouble concentrating - - - - 0  Moving slowly or fidgety/restless - - - - 0  Suicidal thoughts - - - - 0  PHQ-9 Score - - - - 0     Review of  Systems  Constitutional:       Bladder control problems   All other systems reviewed and are negative.      Objective:   Physical Exam  Constitutional: She is oriented to person, place, and time. She appears well-developed and well-nourished.  HENT:  Head: Normocephalic and atraumatic.  Neck: Normal range of motion. Neck supple.  Cardiovascular: Normal rate and regular rhythm.   Pulmonary/Chest: Effort normal and breath sounds normal.  Musculoskeletal:  Normal Muscle Bulk and Muscle Testing Reveals: Upper Extremities: Decreased ROM: Right 45 Degrees and Muscle Strength 4/5 Left: 90 Degrees and Muscle Strength 4/5 Lumbar Paraspinal Tenderness: L-3- L-5 Left Greater Trochanteric tenderness Lower Extremities: Full ROM and Muscle Strength 5/5 Arises from chair slowly using walker for support Narrow Based gait  Neurological: She is alert and oriented to person, place, and time.  Skin: Skin is warm and dry.  Psychiatric: She has a normal mood and affect.  Nursing note and vitals reviewed.         Assessment & Plan:  1. Left Lumbar 3 radiculopathy/stenosis s/p L3-4 laminectomy and fusion: Refilled: Oxycodone 10 mg one tablet every 8 hours as needed # 60.  We will continue the opioid monitoring program, this consists of regular clinic visits, examinations, urine drug screen, pill counts as well as use of [place] Cont[place] Substance reporting System.  Continue with Physical Therapy and HEP using walker for balance and stability. 2. Diabetes with peripheral neuropathy: Continue with Lidoderm and Voltaren Gel.  20 minutes of face to face patient care time was spent during this visit. All questions were encouraged and answered   F/U in 1 month

## 2016-05-25 DIAGNOSIS — N181 Chronic kidney disease, stage 1: Secondary | ICD-10-CM | POA: Diagnosis not present

## 2016-05-25 DIAGNOSIS — B961 Klebsiella pneumoniae [K. pneumoniae] as the cause of diseases classified elsewhere: Secondary | ICD-10-CM | POA: Diagnosis not present

## 2016-05-25 DIAGNOSIS — J069 Acute upper respiratory infection, unspecified: Secondary | ICD-10-CM | POA: Diagnosis not present

## 2016-05-25 DIAGNOSIS — I10 Essential (primary) hypertension: Secondary | ICD-10-CM | POA: Diagnosis not present

## 2016-05-25 DIAGNOSIS — E039 Hypothyroidism, unspecified: Secondary | ICD-10-CM | POA: Diagnosis not present

## 2016-05-30 ENCOUNTER — Encounter: Payer: Self-pay | Admitting: Physical Medicine & Rehabilitation

## 2016-05-30 ENCOUNTER — Encounter
Payer: Worker's Compensation | Attending: Physical Medicine & Rehabilitation | Admitting: Physical Medicine & Rehabilitation

## 2016-05-30 VITALS — BP 171/70 | HR 62 | Resp 14

## 2016-05-30 DIAGNOSIS — M5416 Radiculopathy, lumbar region: Secondary | ICD-10-CM | POA: Diagnosis not present

## 2016-05-30 DIAGNOSIS — E039 Hypothyroidism, unspecified: Secondary | ICD-10-CM | POA: Diagnosis not present

## 2016-05-30 DIAGNOSIS — M25552 Pain in left hip: Secondary | ICD-10-CM | POA: Diagnosis present

## 2016-05-30 DIAGNOSIS — M329 Systemic lupus erythematosus, unspecified: Secondary | ICD-10-CM | POA: Diagnosis not present

## 2016-05-30 DIAGNOSIS — E1140 Type 2 diabetes mellitus with diabetic neuropathy, unspecified: Secondary | ICD-10-CM | POA: Diagnosis not present

## 2016-05-30 DIAGNOSIS — M545 Low back pain: Secondary | ICD-10-CM | POA: Diagnosis present

## 2016-05-30 DIAGNOSIS — I10 Essential (primary) hypertension: Secondary | ICD-10-CM | POA: Insufficient documentation

## 2016-05-30 DIAGNOSIS — K219 Gastro-esophageal reflux disease without esophagitis: Secondary | ICD-10-CM | POA: Diagnosis not present

## 2016-05-30 DIAGNOSIS — S72002S Fracture of unspecified part of neck of left femur, sequela: Secondary | ICD-10-CM

## 2016-05-30 DIAGNOSIS — M948X9 Other specified disorders of cartilage, unspecified sites: Secondary | ICD-10-CM | POA: Diagnosis not present

## 2016-05-30 DIAGNOSIS — Z981 Arthrodesis status: Secondary | ICD-10-CM | POA: Diagnosis not present

## 2016-05-30 DIAGNOSIS — Z889 Allergy status to unspecified drugs, medicaments and biological substances status: Secondary | ICD-10-CM | POA: Insufficient documentation

## 2016-05-30 DIAGNOSIS — E785 Hyperlipidemia, unspecified: Secondary | ICD-10-CM | POA: Diagnosis not present

## 2016-05-30 DIAGNOSIS — M4806 Spinal stenosis, lumbar region: Secondary | ICD-10-CM | POA: Insufficient documentation

## 2016-05-30 DIAGNOSIS — M898X9 Other specified disorders of bone, unspecified site: Secondary | ICD-10-CM

## 2016-05-30 MED ORDER — OXYCODONE HCL 10 MG PO TABS: 10.0000 mg | ORAL_TABLET | Freq: Three times a day (TID) | ORAL | Status: DC | PRN

## 2016-05-30 NOTE — Progress Notes (Signed)
Subjective:    Patient ID: April [name], female    DOB: [birth year], 82 y.o.   MRN: [id number]  HPI   April [name] is here in follow up of her gait disorder and chronic pain. She was just discharged from PT last week. She hit a functional plateau of sorts. More recently she has just been performing maintenance exercises with PT.       Pain Inventory Average Pain 5 Pain Right Now 5 My pain is aching  In the last 24 hours, has pain interfered with the following? General activity 0 Relation with others 0 Enjoyment of life 0 What TIME of day is your pain at its worst? evening Sleep (in general) Fair  Pain is worse with: walking and standing Pain improves with: medication Relief from Meds: NA  Mobility walk with assistance use a walker how many minutes can you walk? 10 ability to climb steps?  yes do you drive?  yes Do you have any goals in this area?  yes  Function retired I need assistance with the following:  meal prep and household duties  Neuro/Psych bladder control problems  Prior Studies Any changes since last visit?  no  Physicians involved in your care Any changes since last visit?  no   History reviewed. No pertinent family history. Social History   Social History  . Marital Status: Single    Spouse Name: N/A  . Number of Children: N/A  . Years of Education: N/A   Social History Main Topics  . Smoking status: Never Smoker   . Smokeless tobacco: Never Used  . Alcohol Use: No  . Drug Use: No  . Sexual Activity: No   Other Topics Concern  . None   Social History Narrative   Past Surgical History  Procedure Laterality Date  . Cholecystectomy    . Hip arthroplasty Left 04/08/2014    Procedure: LEFT HIP HEMIARTHROPLASTY;  Surgeon: Verlee Rossetti, MD;  Location: [facility] OR;  Service: Orthopedics;  Laterality: Left;  . Tonsillectomy    . Adenoidectomy    . Left cataract removed    . Eye surgery     Past Medical History  Diagnosis Date    . Hyperlipidemia 04/07/2014    takes Lovastatin daily  . OA (osteoarthritis) 04/07/2014  . Hypertension     takes Amlodipine and Atenolol daily  . HTN (hypertension) 04/07/2014  . Diabetes mellitus without complication (HCC)     takes Lantus daily  . Hypothyroidism 04/07/2014    takes Synthroid daily  . History of bronchitis 20 yrs ago  . History of lupus 19yrs ago  . Joint pain   . Peripheral edema   . UTI (lower urinary tract infection)     completed antibioitcs on 07/28/14  . Urinary frequency     takes Vesicare daily  . Chronic back pain     stenosis  . Constipation     takes an OTC stool softener   . GERD (gastroesophageal reflux disease)     not on Protonix daily  . Nocturia   . Cataract     left eye  . History of shingles    BP 171/70 mmHg  Pulse 62  Resp 14  SpO2 96%  Opioid Risk Score:   Fall Risk Score:  `1  Depression screen PHQ 2/9  Depression screen [facility] 2/9 05/30/2016 03/06/2016 01/06/2016 08/18/2015 07/16/2015 03/10/2015  Decreased Interest 0 1 0 0 0 0  Down, Depressed, Hopeless 0 0 0 0 0  0  PHQ - 2 Score 0 1 0 0 0 0  Altered sleeping - - - - - 0  Tired, decreased energy - - - - - 0  Change in appetite - - - - - 0  Feeling bad or failure about yourself  - - - - - 0  Trouble concentrating - - - - - 0  Moving slowly or fidgety/restless - - - - - 0  Suicidal thoughts - - - - - 0  PHQ-9 Score - - - - - 0       Review of Systems  All other systems reviewed and are negative.      Objective:   Physical Exam  Constitutional: She is oriented to person, place, and time. She appears well-developed and well-nourished.  HENT: oral mucosa pink and moist  Head: Normocephalic and atraumatic.  Eyes: Conjunctivae are normal. Pupils are equal, round, and reactive to light.  Neck: Normal range of motion. Neck supple.  Cardiovascular: Normal rate and regular rhythm. No murmurs or rubs.  Respiratory: Effort normal and breath sounds normal. No respiratory distress.  She has no wheezes. No rales  GI: Soft. Bowel sounds are normal. She exhibits no distension. There is no tenderness.  Musculoskeletal: She exhibits mild edema lower ext edema, left more than right  Less antalgia left lower ext, large palpable bone mass along lateral hip---area less tender to touch. Area is tender to touch. Ambulated with walker with reasonable balance and improved weight shift.  Neurological: She is alert and oriented to person, place, and time. No cranial nerve deficit. Coordination normal.  Good sitting balance. Quads,KE 5/5. ADF/APF 5/5. UE's grossly 5/5 prox to distal. No focal sensory loss is appreciated however except for mild stocking glove sensory loss in the feet (to ankles.ower leg). Cognitively displays normal insight, awareness, memory.  Skin: Skin is warm and dry.  Psychiatric: She has a normal mood and affect. Her behavior is normal. Thought content normal    Assessment/Plan:  1. Left Lumbar 3 radiculopathy/stenosis s/p L3-4 laminectomy and fusion  2. Diabetes with peripheral neuropathy: likely component of distal, bilateral leg pain  3. Heterotopic bone--left hip---this is likely the biggest component to her pain picture     Plan:  1. Continue with walker for balance.  2. Oxycodone for breakthrough pain 10mg  #60. A second rx was given for next month. Given that April [name] is a not an operative candidate, she will require ongoing pain medication to help control her pain.   4. Additionally to help control her pain she should maintiain a HEP. Joining a gym would be good option to help maintain activity. A stationary bicycle would be ideal. 5. Ibuprofen PRN with food.  6. Lidoderm patch to left hip/leg. She may try OTC Icey Hot patches with lidocaine as a substitute.     Follow up with me in 2 months. Thirty minutes of face to face patient care time were spent during this visit. All questions were encouraged and answered. Reviewed with case manager also.

## 2016-05-30 NOTE — Patient Instructions (Addendum)
PLEASE CALL ME WITH ANY PROBLEMS OR QUESTIONS [phone]).     A GOOD HOME EXERCISE PROGRAM IS KEY.    ICEY-HOT LIDOCAINE PATCHES

## 2016-05-31 ENCOUNTER — Telehealth: Payer: Self-pay | Admitting: Physical Medicine & Rehabilitation

## 2016-05-31 NOTE — Telephone Encounter (Signed)
Please address thank you 

## 2016-05-31 NOTE — Telephone Encounter (Signed)
Pt is unable to return to work given her ongoing pain and gait/balance deficits

## 2016-05-31 NOTE — Telephone Encounter (Signed)
Cletis Athensheryl Long case manager needs to get a work note for patient.  The adjuster for patient's case needed to know if patient can return to work.  Elnita MaxwellCheryl will need a detailed note that gives work restrictions if she can go back to work, or if she is unable to return to work.  Any questions please call her at [phone] or if the note can be faxed over to [phone].

## 2016-06-01 NOTE — Telephone Encounter (Signed)
April [name] would like a letter to worker's comp documenting that she can not return to work. April [name] also stated that if you think this will be permanent, to include that in the letter. Otherwise, worker's comp will frequently ask for documentation for the pt to return to work. Please advise.

## 2016-07-05 DIAGNOSIS — D500 Iron deficiency anemia secondary to blood loss (chronic): Secondary | ICD-10-CM | POA: Diagnosis not present

## 2016-07-05 DIAGNOSIS — E1122 Type 2 diabetes mellitus with diabetic chronic kidney disease: Secondary | ICD-10-CM | POA: Diagnosis not present

## 2016-07-05 DIAGNOSIS — E039 Hypothyroidism, unspecified: Secondary | ICD-10-CM | POA: Diagnosis not present

## 2016-07-05 DIAGNOSIS — E782 Mixed hyperlipidemia: Secondary | ICD-10-CM | POA: Diagnosis not present

## 2016-07-12 DIAGNOSIS — Z6830 Body mass index (BMI) 30.0-30.9, adult: Secondary | ICD-10-CM | POA: Diagnosis not present

## 2016-07-12 DIAGNOSIS — E782 Mixed hyperlipidemia: Secondary | ICD-10-CM | POA: Diagnosis not present

## 2016-07-12 DIAGNOSIS — E039 Hypothyroidism, unspecified: Secondary | ICD-10-CM | POA: Diagnosis not present

## 2016-07-12 DIAGNOSIS — I10 Essential (primary) hypertension: Secondary | ICD-10-CM | POA: Diagnosis not present

## 2016-07-12 DIAGNOSIS — E1122 Type 2 diabetes mellitus with diabetic chronic kidney disease: Secondary | ICD-10-CM | POA: Diagnosis not present

## 2016-07-12 DIAGNOSIS — E119 Type 2 diabetes mellitus without complications: Secondary | ICD-10-CM | POA: Diagnosis not present

## 2016-07-12 DIAGNOSIS — D500 Iron deficiency anemia secondary to blood loss (chronic): Secondary | ICD-10-CM | POA: Diagnosis not present

## 2016-07-12 DIAGNOSIS — N181 Chronic kidney disease, stage 1: Secondary | ICD-10-CM | POA: Diagnosis not present

## 2016-07-17 DIAGNOSIS — L719 Rosacea, unspecified: Secondary | ICD-10-CM | POA: Diagnosis not present

## 2016-07-26 ENCOUNTER — Encounter: Payer: Self-pay | Admitting: Physical Medicine & Rehabilitation

## 2016-07-26 ENCOUNTER — Encounter
Payer: Worker's Compensation | Attending: Physical Medicine & Rehabilitation | Admitting: Physical Medicine & Rehabilitation

## 2016-07-26 VITALS — BP 132/78 | HR 64 | Resp 14

## 2016-07-26 DIAGNOSIS — Z79899 Other long term (current) drug therapy: Secondary | ICD-10-CM

## 2016-07-26 DIAGNOSIS — M549 Dorsalgia, unspecified: Secondary | ICD-10-CM | POA: Diagnosis not present

## 2016-07-26 DIAGNOSIS — M4806 Spinal stenosis, lumbar region: Secondary | ICD-10-CM | POA: Insufficient documentation

## 2016-07-26 DIAGNOSIS — Z5181 Encounter for therapeutic drug level monitoring: Secondary | ICD-10-CM

## 2016-07-26 DIAGNOSIS — K219 Gastro-esophageal reflux disease without esophagitis: Secondary | ICD-10-CM | POA: Diagnosis not present

## 2016-07-26 DIAGNOSIS — G8929 Other chronic pain: Secondary | ICD-10-CM | POA: Diagnosis present

## 2016-07-26 DIAGNOSIS — I10 Essential (primary) hypertension: Secondary | ICD-10-CM | POA: Diagnosis not present

## 2016-07-26 DIAGNOSIS — Z87440 Personal history of urinary (tract) infections: Secondary | ICD-10-CM | POA: Diagnosis not present

## 2016-07-26 DIAGNOSIS — R269 Unspecified abnormalities of gait and mobility: Secondary | ICD-10-CM | POA: Diagnosis present

## 2016-07-26 DIAGNOSIS — F329 Major depressive disorder, single episode, unspecified: Secondary | ICD-10-CM | POA: Diagnosis not present

## 2016-07-26 DIAGNOSIS — M898X9 Other specified disorders of bone, unspecified site: Secondary | ICD-10-CM

## 2016-07-26 DIAGNOSIS — M948X9 Other specified disorders of cartilage, unspecified sites: Secondary | ICD-10-CM

## 2016-07-26 DIAGNOSIS — E785 Hyperlipidemia, unspecified: Secondary | ICD-10-CM | POA: Diagnosis not present

## 2016-07-26 DIAGNOSIS — R601 Generalized edema: Secondary | ICD-10-CM | POA: Diagnosis not present

## 2016-07-26 DIAGNOSIS — M1990 Unspecified osteoarthritis, unspecified site: Secondary | ICD-10-CM | POA: Insufficient documentation

## 2016-07-26 DIAGNOSIS — K5900 Constipation, unspecified: Secondary | ICD-10-CM | POA: Insufficient documentation

## 2016-07-26 DIAGNOSIS — M5416 Radiculopathy, lumbar region: Secondary | ICD-10-CM | POA: Diagnosis not present

## 2016-07-26 DIAGNOSIS — E1142 Type 2 diabetes mellitus with diabetic polyneuropathy: Secondary | ICD-10-CM | POA: Diagnosis not present

## 2016-07-26 DIAGNOSIS — E039 Hypothyroidism, unspecified: Secondary | ICD-10-CM | POA: Insufficient documentation

## 2016-07-26 DIAGNOSIS — S72002S Fracture of unspecified part of neck of left femur, sequela: Secondary | ICD-10-CM | POA: Diagnosis not present

## 2016-07-26 DIAGNOSIS — Q6589 Other specified congenital deformities of hip: Secondary | ICD-10-CM | POA: Insufficient documentation

## 2016-07-26 MED ORDER — OXYCODONE HCL 10 MG PO TABS: 10.0000 mg | ORAL_TABLET | Freq: Three times a day (TID) | ORAL | 0 refills | Status: DC | PRN

## 2016-07-26 NOTE — Addendum Note (Signed)
Addended by: Angela Nevin D on: 07/26/2016 11:57 AM   Modules accepted: Orders

## 2016-07-26 NOTE — Patient Instructions (Signed)
CONTINUE WITH DAILY EXERCISE AND RANGE OF MOTION WORK.

## 2016-07-26 NOTE — Progress Notes (Addendum)
Subjective:    Patient ID: April [name], female    DOB: [birth year], 82 y.o.   MRN: [id number]  HPI   Nazaria is here in follow up of her chronic pain and gait disorder. She has been fairly stable with her pain. She deals with pain every day but with medication it's tolerable. She tries to walk most days. She is using some herbal, topical treatments for pain too which she's found helpful. Most of the time she is utilizing a walker for balance. Sometimes for short distances she goes without.  Oxycodone remains beneficial for her pain control. She takes 1-2 per day on avg. She is doing some driving locally but never uses oxycodone before she drives.     Pain Inventory Average Pain 5 Pain Right Now 5 My pain is aching  In the last 24 hours, has pain interfered with the following? General activity 3 Relation with others 3 Enjoyment of life 3 What TIME of day is your pain at its worst? evening Sleep (in general) Good  Pain is worse with: walking Pain improves with: medication Relief from Meds: 5  Mobility walk with assistance  Function retired  Neuro/Psych trouble walking depression  Prior Studies Any changes since last visit?  no  Physicians involved in your care Any changes since last visit?  no   History reviewed. No pertinent family history. Social History   Social History  . Marital status: Single    Spouse name: N/A  . Number of children: N/A  . Years of education: N/A   Social History Main Topics  . Smoking status: Never Smoker  . Smokeless tobacco: Never Used  . Alcohol use No  . Drug use: No  . Sexual activity: No   Other Topics Concern  . None   Social History Narrative  . None   Past Surgical History:  Procedure Laterality Date  . ADENOIDECTOMY    . CHOLECYSTECTOMY    . EYE SURGERY    . HIP ARTHROPLASTY Left 04/08/2014   Procedure: LEFT HIP HEMIARTHROPLASTY;  Surgeon: Verlee Rossetti, MD;  Location: [facility] OR;  Service: Orthopedics;   Laterality: Left;  . left cataract removed    . TONSILLECTOMY     Past Medical History:  Diagnosis Date  . Cataract    left eye  . Chronic back pain    stenosis  . Constipation    takes an OTC stool softener   . Diabetes mellitus without complication (HCC)    takes Lantus daily  . GERD (gastroesophageal reflux disease)    not on Protonix daily  . History of bronchitis 20 yrs ago  . History of lupus 58yrs ago  . History of shingles   . HTN (hypertension) 04/07/2014  . Hyperlipidemia 04/07/2014   takes Lovastatin daily  . Hypertension    takes Amlodipine and Atenolol daily  . Hypothyroidism 04/07/2014   takes Synthroid daily  . Joint pain   . Nocturia   . OA (osteoarthritis) 04/07/2014  . Peripheral edema   . Urinary frequency    takes Vesicare daily  . UTI (lower urinary tract infection)    completed antibioitcs on 07/28/14   BP 132/78 (BP Location: Left Arm, Patient Position: Sitting, Cuff Size: Normal)   Pulse 64   Resp 14   SpO2 91%   Opioid Risk Score:   Fall Risk Score:  `1  Depression screen PHQ 2/9  Depression screen [facility] 2/9 05/30/2016 03/06/2016 01/06/2016 08/18/2015 07/16/2015 03/10/2015  Decreased Interest 0  1 0 0 0 0  Down, Depressed, Hopeless 0 0 0 0 0 0  PHQ - 2 Score 0 1 0 0 0 0  Altered sleeping - - - - - 0  Tired, decreased energy - - - - - 0  Change in appetite - - - - - 0  Feeling bad or failure about yourself  - - - - - 0  Trouble concentrating - - - - - 0  Moving slowly or fidgety/restless - - - - - 0  Suicidal thoughts - - - - - 0  PHQ-9 Score - - - - - 0    Review of Systems  Constitutional: Negative.   HENT: Negative.   Eyes: Negative.   Respiratory: Positive for cough.   Cardiovascular: Negative.   Gastrointestinal: Positive for constipation.  Endocrine: Negative.   Genitourinary: Negative.   Musculoskeletal: Positive for gait problem.  Allergic/Immunologic: Negative.   Hematological: Negative.   Psychiatric/Behavioral: Positive for  dysphoric mood.  All other systems reviewed and are negative.      Objective:   Physical Exam  Constitutional: She is oriented to person, place, and time. She appears well-developed and well-nourished.  HENT: oral mucosa pink and moist  Head: Normocephalic and atraumatic.  Eyes: Conjunctivae are normal. Pupils are equal, round, and reactive to light.  Neck: Normal range of motion. Neck supple.  Cardiovascular: Normal rate and regular rhythm. No murmurs or rubs.  Respiratory: Effort normal and breath sounds normal. No respiratory distress. She has no wheezes. No rales  GI: Soft. Bowel sounds are normal. She exhibits no distension. There is no tenderness.  Musculoskeletal: She exhibits mild edema lower ext edema, left more than right  Ambulates with antalgia to left but better weight shift overall. She is steady with her RW. changes direction without issues. Neurological: She is alert and oriented to person, place, and time. No cranial nerve deficit. Coordination normal.  Good sitting balance. Quads,KE 5/5. ADF/APF 5/5. UE's grossly 5/5 prox to distal. No focal sensory loss is appreciated however except for mild stocking glove sensory loss in the feet (to ankles.ower leg). Cognitively displays normal insight, awareness, memory.  Skin: Skin is warm and dry.  Psychiatric: She has a normal mood and affect. Her behavior is normal. Thought content normal    Assessment/Plan:  1. Left Lumbar 3 radiculopathy/stenosis s/p L3-4 laminectomy and fusion  2. Diabetes with peripheral neuropathy: likely component of distal, bilateral leg pain  3. Heterotopic bone--left hip---this is likely the biggest component to her pain picture     Plan:  1. Continue with walker for balance.  2. Oxycodone for breakthrough pain  #60. A second rx was given for next month. Given that Mrs. Rozycki is a not an operative candidate, she will require ongoing pain medication to help control her pain.   4. Needs to  maintain HEP on a daily basis as possible to maintain stress/ROM. 5. Ibuprofen PRN with food.  6. Lidoderm patch to left hip/leg PRN.     Follow up with me in 3 months. Thirty minutes of face to face patient care time were spent during this visit. All questions were encouraged and answered. Reviewed with case manager also

## 2016-08-03 LAB — TOXASSURE SELECT,+ANTIDEPR,UR: PDF: 0

## 2016-08-04 NOTE — Progress Notes (Signed)
Urine drug screen for this encounter is consistent for prescribed medications.   

## 2016-10-09 ENCOUNTER — Telehealth: Payer: Self-pay | Admitting: *Deleted

## 2016-10-09 NOTE — Telephone Encounter (Signed)
Cletis Athensheryl Long, nurse case manager, [employer], left a message stating that back in June an insurance adjuster had clasified the patient as being capable of returning to work. Nurse case manager states that she had a discussion with Dr. Riley KillSwartz in which she quotes Dr. Riley KillSwartz as saying 'MD disagrees with return to work because she requires a walker to ambulate; she has limited stamina; She suffered a broken hip and lumbar spine. Due to these factors and her age, She says Dr. Riley KillSwartz was unwilling to return patient to work'. All that being said, for legal purposes they need this in writing from the physicians office. The case was apparently closed creating a difficult situation for the patient. The letter can be faxed directly to the patient's attorney, Little Ishikawaamela Foster, Fax#:[phone], Phone#:[phone]

## 2016-10-09 NOTE — Telephone Encounter (Signed)
Letter written

## 2016-10-10 ENCOUNTER — Telehealth: Payer: Self-pay | Admitting: *Deleted

## 2016-10-10 NOTE — Telephone Encounter (Signed)
error 

## 2016-10-10 NOTE — Telephone Encounter (Signed)
Letter faxed to attorney Little Ishikawaamela Foster Fx # [phone].

## 2016-10-18 DIAGNOSIS — E782 Mixed hyperlipidemia: Secondary | ICD-10-CM | POA: Diagnosis not present

## 2016-10-18 DIAGNOSIS — E039 Hypothyroidism, unspecified: Secondary | ICD-10-CM | POA: Diagnosis not present

## 2016-10-18 DIAGNOSIS — E559 Vitamin D deficiency, unspecified: Secondary | ICD-10-CM | POA: Diagnosis not present

## 2016-10-18 DIAGNOSIS — I10 Essential (primary) hypertension: Secondary | ICD-10-CM | POA: Diagnosis not present

## 2016-10-18 DIAGNOSIS — D500 Iron deficiency anemia secondary to blood loss (chronic): Secondary | ICD-10-CM | POA: Diagnosis not present

## 2016-10-18 DIAGNOSIS — E1122 Type 2 diabetes mellitus with diabetic chronic kidney disease: Secondary | ICD-10-CM | POA: Diagnosis not present

## 2016-10-23 DIAGNOSIS — E039 Hypothyroidism, unspecified: Secondary | ICD-10-CM | POA: Diagnosis not present

## 2016-10-23 DIAGNOSIS — E559 Vitamin D deficiency, unspecified: Secondary | ICD-10-CM | POA: Diagnosis not present

## 2016-10-23 DIAGNOSIS — N182 Chronic kidney disease, stage 2 (mild): Secondary | ICD-10-CM | POA: Diagnosis not present

## 2016-10-23 DIAGNOSIS — I10 Essential (primary) hypertension: Secondary | ICD-10-CM | POA: Diagnosis not present

## 2016-10-23 DIAGNOSIS — M810 Age-related osteoporosis without current pathological fracture: Secondary | ICD-10-CM | POA: Diagnosis not present

## 2016-10-23 DIAGNOSIS — Z23 Encounter for immunization: Secondary | ICD-10-CM | POA: Diagnosis not present

## 2016-10-23 DIAGNOSIS — Z6830 Body mass index (BMI) 30.0-30.9, adult: Secondary | ICD-10-CM | POA: Diagnosis not present

## 2016-10-23 DIAGNOSIS — E1122 Type 2 diabetes mellitus with diabetic chronic kidney disease: Secondary | ICD-10-CM | POA: Diagnosis not present

## 2016-10-23 DIAGNOSIS — E782 Mixed hyperlipidemia: Secondary | ICD-10-CM | POA: Diagnosis not present

## 2016-10-23 DIAGNOSIS — L89312 Pressure ulcer of right buttock, stage 2: Secondary | ICD-10-CM | POA: Diagnosis not present

## 2016-10-23 DIAGNOSIS — D500 Iron deficiency anemia secondary to blood loss (chronic): Secondary | ICD-10-CM | POA: Diagnosis not present

## 2016-10-23 DIAGNOSIS — R350 Frequency of micturition: Secondary | ICD-10-CM | POA: Diagnosis not present

## 2016-10-25 ENCOUNTER — Encounter
Payer: Worker's Compensation | Attending: Physical Medicine & Rehabilitation | Admitting: Physical Medicine & Rehabilitation

## 2016-10-25 ENCOUNTER — Encounter: Payer: Self-pay | Admitting: Physical Medicine & Rehabilitation

## 2016-10-25 VITALS — BP 139/82 | HR 62 | Resp 14

## 2016-10-25 DIAGNOSIS — M549 Dorsalgia, unspecified: Secondary | ICD-10-CM | POA: Insufficient documentation

## 2016-10-25 DIAGNOSIS — F329 Major depressive disorder, single episode, unspecified: Secondary | ICD-10-CM | POA: Diagnosis not present

## 2016-10-25 DIAGNOSIS — E1142 Type 2 diabetes mellitus with diabetic polyneuropathy: Secondary | ICD-10-CM | POA: Insufficient documentation

## 2016-10-25 DIAGNOSIS — M1990 Unspecified osteoarthritis, unspecified site: Secondary | ICD-10-CM | POA: Insufficient documentation

## 2016-10-25 DIAGNOSIS — K219 Gastro-esophageal reflux disease without esophagitis: Secondary | ICD-10-CM | POA: Insufficient documentation

## 2016-10-25 DIAGNOSIS — R601 Generalized edema: Secondary | ICD-10-CM | POA: Diagnosis not present

## 2016-10-25 DIAGNOSIS — M898X9 Other specified disorders of bone, unspecified site: Secondary | ICD-10-CM | POA: Diagnosis not present

## 2016-10-25 DIAGNOSIS — M48061 Spinal stenosis, lumbar region without neurogenic claudication: Secondary | ICD-10-CM

## 2016-10-25 DIAGNOSIS — E785 Hyperlipidemia, unspecified: Secondary | ICD-10-CM | POA: Diagnosis not present

## 2016-10-25 DIAGNOSIS — G8929 Other chronic pain: Secondary | ICD-10-CM | POA: Diagnosis present

## 2016-10-25 DIAGNOSIS — I10 Essential (primary) hypertension: Secondary | ICD-10-CM | POA: Diagnosis not present

## 2016-10-25 DIAGNOSIS — S72002S Fracture of unspecified part of neck of left femur, sequela: Secondary | ICD-10-CM | POA: Diagnosis not present

## 2016-10-25 DIAGNOSIS — M5416 Radiculopathy, lumbar region: Secondary | ICD-10-CM

## 2016-10-25 DIAGNOSIS — R269 Unspecified abnormalities of gait and mobility: Secondary | ICD-10-CM | POA: Insufficient documentation

## 2016-10-25 DIAGNOSIS — E039 Hypothyroidism, unspecified: Secondary | ICD-10-CM | POA: Insufficient documentation

## 2016-10-25 DIAGNOSIS — Z87440 Personal history of urinary (tract) infections: Secondary | ICD-10-CM | POA: Diagnosis not present

## 2016-10-25 DIAGNOSIS — K5900 Constipation, unspecified: Secondary | ICD-10-CM | POA: Diagnosis not present

## 2016-10-25 DIAGNOSIS — Q6589 Other specified congenital deformities of hip: Secondary | ICD-10-CM | POA: Insufficient documentation

## 2016-10-25 MED ORDER — OXYCODONE HCL 10 MG PO TABS: 10.0000 mg | ORAL_TABLET | Freq: Three times a day (TID) | ORAL | 0 refills | Status: DC | PRN

## 2016-10-25 NOTE — Patient Instructions (Signed)
PLEASE CALL ME WITH ANY PROBLEMS OR QUESTIONS ([phone])  

## 2016-10-25 NOTE — Progress Notes (Signed)
Subjective:    Patient ID: April [name], female    DOB: 03/16/1925, 82 y.o.   MRN: [id number]  HPI   [name] is here regarding lumbar radiculopathy and chronic pain.  She has maintained fairly well with her walker in regards to balance. Her exercise activity is limited however by pain with prolonged weight bearing.  She has decreased her oxycodone use and doesn't seem to mind it too much. She uses ibuprofen and her lidoderm patch also for pain control.     Pain Inventory Average Pain 5 Pain Right Now 5 My pain is other  In the last 24 hours, has pain interfered with the following? General activity 1 Relation with others 1 Enjoyment of life 1 What TIME of day is your pain at its worst? evening, night Sleep (in general) Good  Pain is worse with: walking and standing Pain improves with: medication Relief from Meds: n/a  Mobility use a walker how many minutes can you walk? 10 needs help with transfers  Function what is your job? n/a  Neuro/Psych bladder control problems  Prior Studies Any changes since last visit?  no  Physicians involved in your care Any changes since last visit?  no   No family history on file. Social History   Social History  . Marital status: Single    Spouse name: N/A  . Number of children: N/A  . Years of education: N/A   Social History Main Topics  . Smoking status: Never Smoker  . Smokeless tobacco: Never Used  . Alcohol use No  . Drug use: No  . Sexual activity: No   Other Topics Concern  . None   Social History Narrative  . None   Past Surgical History:  Procedure Laterality Date  . ADENOIDECTOMY    . CHOLECYSTECTOMY    . EYE SURGERY    . HIP ARTHROPLASTY Left 04/08/2014   Procedure: LEFT HIP HEMIARTHROPLASTY;  Surgeon: Verlee RossettiSteven R Norris, MD;  [facility];  Service: Orthopedics;  Laterality: Left;  . left cataract removed    . TONSILLECTOMY     Past Medical History:  Diagnosis Date  . Cataract    left  eye  . Chronic back pain    stenosis  . Constipation    takes an OTC stool softener   . Diabetes mellitus without complication (HCC)    takes Lantus daily  . GERD (gastroesophageal reflux disease)    not on Protonix daily  . History of bronchitis 20 yrs ago  . History of lupus 5114yrs ago  . History of shingles   . HTN (hypertension) 04/07/2014  . Hyperlipidemia 04/07/2014   takes Lovastatin daily  . Hypertension    takes Amlodipine and Atenolol daily  . Hypothyroidism 04/07/2014   takes Synthroid daily  . Joint pain   . Nocturia   . OA (osteoarthritis) 04/07/2014  . Peripheral edema   . Urinary frequency    takes Vesicare daily  . UTI (lower urinary tract infection)    completed antibioitcs on 07/28/14   BP 139/82   Pulse 62   Resp 14   SpO2 94%   Opioid Risk Score:   Fall Risk Score:  `1  Depression screen PHQ 2/9  Dep[facility] 2/9 05/30/2016 03/06/2016 01/06/2016 08/18/2015 07/16/2015 03/10/2015  Decreased Interest 0 1 0 0 0 0  Down, Depressed, Hopeless 0 0 0 0 0 0  PHQ - 2 Score 0 1 0 0 0 0  Altered sleeping - - - - -  0  Tired, decreased energy - - - - - 0  Change in appetite - - - - - 0  Feeling bad or failure about yourself  - - - - - 0  Trouble concentrating - - - - - 0  Moving slowly or fidgety/restless - - - - - 0  Suicidal thoughts - - - - - 0  PHQ-9 Score - - - - - 0    Review of Systems  Gastrointestinal: Positive for constipation.  Endocrine:       High blood sugar  All other systems reviewed and are negative.      Objective:   Physical Exam  Constitutional: She is oriented to person, place, and time. She appears well-developed and well-nourished.  HENT: oral mucosa pink and moist  Head: Normocephalic and atraumatic.  Eyes: Conjunctivae are normal. Pupils are equal, round, and reactive to light.  Neck: Normal range of motion. Neck supple.  Cardiovascular: Normal rate and regular rhythm. No murmurs or rubs.  Respiratory: Effort normal and  breath sounds normal. No respiratory distress. She has no wheezes. No rales  GI: Soft. Bowel sounds are normal. She exhibits no distension. There is no tenderness.  Musculoskeletal:  Prominence of left greater troch  Utilizes walker for balance. Still with left sided antalgia Neurological: She is alert and oriented to person, place, and time. No cranial nerve deficit. Coordination normal.    Quads,KE 5/5. ADF/APF 5/5. UE's grossly 5/5 prox to distal.  mild stocking glove sensory loss in the feet (to ankles.ower leg). Cognitively displays normal insight, awareness, memory. Skin: Skin is warm and dry.  Psychiatric: She has a normal mood and affect. Her behavior is normal. Thought content normal   Assessment/Plan:  1. Left Lumbar 3 radiculopathy/stenosis s/p L3-4 laminectomy and fusion  2. Diabetes with peripheral neuropathy: likely component of distal, bilateral leg pain  3. Heterotopic bone--left hip--stable    Plan:  1. Continue with walker for balance. She will likely require this long term 2. Oxycodone for breakthrough pain 10mg  #60. A refilled was provided today.  4. Needs to maintain HEP on a daily basis as possible to maintain strength/ROM. The patient would substantially benefit from aquatic based exercise where her weight could be unloaded while addressing strength, ROM, and balance. A script was written to reflex such  5. Ibuprofen PRN with food is fine for now  6. Lidoderm patch to left hip/leg PRN.    Follow up with me in 3 months. 15 minutes of face to face patient care time were spent during this visit. All questions were encouraged and answered. Reviewed with case manager also

## 2016-12-26 ENCOUNTER — Encounter
Payer: Worker's Compensation | Attending: Physical Medicine & Rehabilitation | Admitting: Physical Medicine & Rehabilitation

## 2016-12-26 ENCOUNTER — Encounter: Payer: Self-pay | Admitting: Physical Medicine & Rehabilitation

## 2016-12-26 VITALS — BP 155/77 | HR 74 | Resp 14

## 2016-12-26 DIAGNOSIS — M48061 Spinal stenosis, lumbar region without neurogenic claudication: Secondary | ICD-10-CM | POA: Diagnosis not present

## 2016-12-26 DIAGNOSIS — F329 Major depressive disorder, single episode, unspecified: Secondary | ICD-10-CM | POA: Insufficient documentation

## 2016-12-26 DIAGNOSIS — Z79899 Other long term (current) drug therapy: Secondary | ICD-10-CM

## 2016-12-26 DIAGNOSIS — G8929 Other chronic pain: Secondary | ICD-10-CM | POA: Diagnosis present

## 2016-12-26 DIAGNOSIS — M549 Dorsalgia, unspecified: Secondary | ICD-10-CM | POA: Insufficient documentation

## 2016-12-26 DIAGNOSIS — Z87440 Personal history of urinary (tract) infections: Secondary | ICD-10-CM | POA: Insufficient documentation

## 2016-12-26 DIAGNOSIS — K219 Gastro-esophageal reflux disease without esophagitis: Secondary | ICD-10-CM | POA: Insufficient documentation

## 2016-12-26 DIAGNOSIS — M898X9 Other specified disorders of bone, unspecified site: Secondary | ICD-10-CM

## 2016-12-26 DIAGNOSIS — I10 Essential (primary) hypertension: Secondary | ICD-10-CM | POA: Diagnosis not present

## 2016-12-26 DIAGNOSIS — K5900 Constipation, unspecified: Secondary | ICD-10-CM | POA: Insufficient documentation

## 2016-12-26 DIAGNOSIS — M1990 Unspecified osteoarthritis, unspecified site: Secondary | ICD-10-CM | POA: Diagnosis not present

## 2016-12-26 DIAGNOSIS — E1142 Type 2 diabetes mellitus with diabetic polyneuropathy: Secondary | ICD-10-CM | POA: Diagnosis not present

## 2016-12-26 DIAGNOSIS — S72002A Fracture of unspecified part of neck of left femur, initial encounter for closed fracture: Secondary | ICD-10-CM

## 2016-12-26 DIAGNOSIS — M5416 Radiculopathy, lumbar region: Secondary | ICD-10-CM | POA: Diagnosis not present

## 2016-12-26 DIAGNOSIS — E785 Hyperlipidemia, unspecified: Secondary | ICD-10-CM | POA: Diagnosis not present

## 2016-12-26 DIAGNOSIS — R601 Generalized edema: Secondary | ICD-10-CM | POA: Diagnosis not present

## 2016-12-26 DIAGNOSIS — E039 Hypothyroidism, unspecified: Secondary | ICD-10-CM | POA: Insufficient documentation

## 2016-12-26 DIAGNOSIS — R269 Unspecified abnormalities of gait and mobility: Secondary | ICD-10-CM | POA: Diagnosis present

## 2016-12-26 DIAGNOSIS — Q6589 Other specified congenital deformities of hip: Secondary | ICD-10-CM | POA: Insufficient documentation

## 2016-12-26 DIAGNOSIS — Z5181 Encounter for therapeutic drug level monitoring: Secondary | ICD-10-CM

## 2016-12-26 NOTE — Progress Notes (Signed)
Subjective:    Patient ID: April [name], female    DOB: 1925/08/30, 83 y.o.   MRN: [id number]  HPI   April [name] is here in follow up of her chronic pain. Her pain levels are consistent. She has maintained despite decreased activity over the last two, cold weeks we've had here.  She is using oxycodone rarely. She uses ibuprofen 400mg  most days which helps with pain.   She utilizes voltaren gel and lidoderm patches as well for pain relief which help.    Pain Inventory Average Pain 5 Pain Right Now 5 My pain is aching  In the last 24 hours, has pain interfered with the following? General activity 4 Relation with others 4 Enjoyment of life 4 What TIME of day is your pain at its worst? evening Sleep (in general) Fair  Pain is worse with: walking Pain improves with: medication Relief from Meds: 5  Mobility walk without assistance  Function retired  Neuro/Psych trouble walking  Prior Studies Any changes since last visit?  no  Physicians involved in your care Any changes since last visit?  no   History reviewed. No pertinent family history. Social History   Social History  . Marital status: Single    Spouse name: N/A  . Number of children: N/A  . Years of education: N/A   Social History Main Topics  . Smoking status: Never Smoker  . Smokeless tobacco: Never Used  . Alcohol use No  . Drug use: No  . Sexual activity: No   Other Topics Concern  . None   Social History Narrative  . None   Past Surgical History:  Procedure Laterality Date  . ADENOIDECTOMY    . CHOLECYSTECTOMY    . EYE SURGERY    . HIP ARTHROPLASTY Left 04/08/2014   Procedure: LEFT HIP HEMIARTHROPLASTY;  Surgeon: Verlee Rossetti, MD;  Location: [facility] OR;  Service: Orthopedics;  Laterality: Left;  . left cataract removed    . TONSILLECTOMY     Past Medical History:  Diagnosis Date  . Cataract    left eye  . Chronic back pain    stenosis  . Constipation    takes an OTC stool  softener   . Diabetes mellitus without complication (HCC)    takes Lantus daily  . GERD (gastroesophageal reflux disease)    not on Protonix daily  . History of bronchitis 20 yrs ago  . History of lupus 71yrs ago  . History of shingles   . HTN (hypertension) 04/07/2014  . Hyperlipidemia 04/07/2014   takes Lovastatin daily  . Hypertension    takes Amlodipine and Atenolol daily  . Hypothyroidism 04/07/2014   takes Synthroid daily  . Joint pain   . Nocturia   . OA (osteoarthritis) 04/07/2014  . Peripheral edema   . Urinary frequency    takes Vesicare daily  . UTI (lower urinary tract infection)    completed antibioitcs on 07/28/14   BP (!) 155/77   Pulse 74   Resp 14   SpO2 97%   Opioid Risk Score:   Fall Risk Score:  `1  Depression screen PHQ 2/9  Depression screen [facility] 2/9 05/30/2016 03/06/2016 01/06/2016 08/18/2015 07/16/2015 03/10/2015  Decreased Interest 0 1 0 0 0 0  Down, Depressed, Hopeless 0 0 0 0 0 0  PHQ - 2 Score 0 1 0 0 0 0  Altered sleeping - - - - - 0  Tired, decreased energy - - - - - 0  Change in appetite - - - - - 0  Feeling bad or failure about yourself  - - - - - 0  Trouble concentrating - - - - - 0  Moving slowly or fidgety/restless - - - - - 0  Suicidal thoughts - - - - - 0  PHQ-9 Score - - - - - 0    Review of Systems  Constitutional: Negative.   HENT: Negative.   Eyes: Negative.   Respiratory: Negative.   Cardiovascular: Negative.   Gastrointestinal: Negative.   Endocrine: Negative.   Genitourinary: Negative.   Musculoskeletal: Positive for arthralgias and back pain.  Skin: Negative.   Allergic/Immunologic: Negative.   Neurological: Negative.   Hematological: Negative.   Psychiatric/Behavioral: Negative.   All other systems reviewed and are negative.      Objective:   Physical Exam  Constitutional: She is oriented to person, place, and time. She appears well-developed and well-nourished.  HENT: oral mucosa pink and moist  Head:  Normocephalic and atraumatic.  Eyes: Conjunctivae are normal. Pupils are equal, round, and reactive to light.  Neck: Normal range of motion. Neck supple.  Cardiovascular: Normal rate and regular rhythm. No murmurs or rubs.  Respiratory: Effort normal and breath sounds normal. No respiratory distress. She has no wheezes. No rales  GI: Soft. Bowel sounds are normal. She exhibits no distension. There is no tenderness.  Musculoskeletal:  Prominence of left greater troch  Utilizes walker for balance. Still with left sided antalgia with weight bearing Neurological: She is alert and oriented to person, place, and time. No cranial nerve deficit. Coordination normal.    Quads,KE 5/5. ADF/APF 5/5. UE's grossly 5/5 prox to distal.  mild stocking glove sensory loss in the feet (to ankles).  Skin: Skin is warm and dry.  Psychiatric: She has a normal mood and affect. Her behavior is normal. Thought content normal   Assessment/Plan:  1. Left Lumbar 3 radiculopathy/stenosis s/p L3-4 laminectomy and fusion  2. Diabetes with peripheral neuropathy: likely component of distal, bilateral leg pain  3. Heterotopic bone--left hip    Plan:  1. Continue with walker for balance. She will likely require this long term 2. Oxycodone for breakthrough pain 10mg  #60. No refill provided today.  4. Recommend aquatic therapy when available. Continue with HEP  5. Ibuprofen PRN with food is fine for now. She is taking only up to 400mg  daily.  would like her to take a "holiday" at least once per week and use heat,ice, tylenol. Should also have renal function checked by PCP 6. Lidoderm patch to left hip/leg PRN.   Follow up with me in 4 months. 15 minutes of face to face patient care time were spent during this visit. All questions were encouraged and answered. Reviewed with case manager also

## 2016-12-26 NOTE — Addendum Note (Signed)
Addended by: Angela NevinWESSLING, Zion Ta D on: 12/26/2016 03:14 PM   Modules accepted: Orders

## 2016-12-26 NOTE — Patient Instructions (Signed)
PLEASE CALL ME WITH ANY PROBLEMS OR QUESTIONS ([phone])  

## 2016-12-30 LAB — TOXASSURE SELECT,+ANTIDEPR,UR

## 2017-01-01 NOTE — Progress Notes (Signed)
Urine drug screen for this encounter is consistent for prescribed medication 

## 2017-01-17 DIAGNOSIS — L719 Rosacea, unspecified: Secondary | ICD-10-CM | POA: Diagnosis not present

## 2017-01-24 DIAGNOSIS — E119 Type 2 diabetes mellitus without complications: Secondary | ICD-10-CM | POA: Diagnosis not present

## 2017-01-24 DIAGNOSIS — D500 Iron deficiency anemia secondary to blood loss (chronic): Secondary | ICD-10-CM | POA: Diagnosis not present

## 2017-01-24 DIAGNOSIS — E782 Mixed hyperlipidemia: Secondary | ICD-10-CM | POA: Diagnosis not present

## 2017-01-24 DIAGNOSIS — E039 Hypothyroidism, unspecified: Secondary | ICD-10-CM | POA: Diagnosis not present

## 2017-01-24 DIAGNOSIS — I10 Essential (primary) hypertension: Secondary | ICD-10-CM | POA: Diagnosis not present

## 2017-01-26 DIAGNOSIS — I5032 Chronic diastolic (congestive) heart failure: Secondary | ICD-10-CM | POA: Diagnosis not present

## 2017-01-26 DIAGNOSIS — E039 Hypothyroidism, unspecified: Secondary | ICD-10-CM | POA: Diagnosis not present

## 2017-01-26 DIAGNOSIS — D500 Iron deficiency anemia secondary to blood loss (chronic): Secondary | ICD-10-CM | POA: Diagnosis not present

## 2017-01-26 DIAGNOSIS — E1122 Type 2 diabetes mellitus with diabetic chronic kidney disease: Secondary | ICD-10-CM | POA: Diagnosis not present

## 2017-01-26 DIAGNOSIS — Z6830 Body mass index (BMI) 30.0-30.9, adult: Secondary | ICD-10-CM | POA: Diagnosis not present

## 2017-01-26 DIAGNOSIS — E782 Mixed hyperlipidemia: Secondary | ICD-10-CM | POA: Diagnosis not present

## 2017-01-26 DIAGNOSIS — E119 Type 2 diabetes mellitus without complications: Secondary | ICD-10-CM | POA: Diagnosis not present

## 2017-01-26 DIAGNOSIS — I10 Essential (primary) hypertension: Secondary | ICD-10-CM | POA: Diagnosis not present

## 2017-01-30 DIAGNOSIS — M79604 Pain in right leg: Secondary | ICD-10-CM | POA: Diagnosis not present

## 2017-01-30 DIAGNOSIS — R609 Edema, unspecified: Secondary | ICD-10-CM | POA: Diagnosis not present

## 2017-01-30 DIAGNOSIS — R600 Localized edema: Secondary | ICD-10-CM | POA: Diagnosis not present

## 2017-01-30 DIAGNOSIS — M79605 Pain in left leg: Secondary | ICD-10-CM | POA: Diagnosis not present

## 2017-01-30 DIAGNOSIS — Z86718 Personal history of other venous thrombosis and embolism: Secondary | ICD-10-CM | POA: Diagnosis not present

## 2017-02-07 ENCOUNTER — Telehealth: Payer: Self-pay | Admitting: Physical Medicine & Rehabilitation

## 2017-02-07 NOTE — Telephone Encounter (Signed)
April FickCathy Richardson (for [facility]) phoned office  Regarding Patients daughter has been caring for in home [phone] - or by phone.  They are trying to assist getting care for patient in home as daughter is no longer capable of caring for her.  They will fax a release for discussion of ZS could either email or respond by phone please.  I will deliver documents once received.

## 2017-02-23 ENCOUNTER — Encounter: Payer: Medicare HMO | Admitting: Registered Nurse

## 2017-02-26 ENCOUNTER — Encounter: Payer: Self-pay | Admitting: Physical Medicine & Rehabilitation

## 2017-02-26 ENCOUNTER — Encounter: Payer: Worker's Compensation | Attending: Registered Nurse | Admitting: Physical Medicine & Rehabilitation

## 2017-02-26 VITALS — BP 100/62 | HR 67

## 2017-02-26 DIAGNOSIS — G8929 Other chronic pain: Secondary | ICD-10-CM | POA: Insufficient documentation

## 2017-02-26 DIAGNOSIS — S72002D Fracture of unspecified part of neck of left femur, subsequent encounter for closed fracture with routine healing: Secondary | ICD-10-CM

## 2017-02-26 DIAGNOSIS — Q6589 Other specified congenital deformities of hip: Secondary | ICD-10-CM | POA: Diagnosis not present

## 2017-02-26 DIAGNOSIS — E1142 Type 2 diabetes mellitus with diabetic polyneuropathy: Secondary | ICD-10-CM | POA: Insufficient documentation

## 2017-02-26 DIAGNOSIS — S72002S Fracture of unspecified part of neck of left femur, sequela: Secondary | ICD-10-CM | POA: Diagnosis not present

## 2017-02-26 DIAGNOSIS — R269 Unspecified abnormalities of gait and mobility: Secondary | ICD-10-CM | POA: Insufficient documentation

## 2017-02-26 DIAGNOSIS — M898X9 Other specified disorders of bone, unspecified site: Secondary | ICD-10-CM | POA: Diagnosis not present

## 2017-02-26 DIAGNOSIS — M5416 Radiculopathy, lumbar region: Secondary | ICD-10-CM | POA: Diagnosis not present

## 2017-02-26 DIAGNOSIS — M48061 Spinal stenosis, lumbar region without neurogenic claudication: Secondary | ICD-10-CM

## 2017-02-26 DIAGNOSIS — S72002A Fracture of unspecified part of neck of left femur, initial encounter for closed fracture: Secondary | ICD-10-CM

## 2017-02-26 MED ORDER — OXYCODONE HCL 10 MG PO TABS: 10.0000 mg | ORAL_TABLET | Freq: Three times a day (TID) | ORAL | 0 refills | Status: DC | PRN

## 2017-02-26 MED ORDER — LIDOCAINE 5 % EX PTCH: 1.0000 | MEDICATED_PATCH | CUTANEOUS | 4 refills | Status: AC

## 2017-02-26 MED ORDER — DICLOFENAC SODIUM 1 % TD GEL: 1.0000 "application " | Freq: Three times a day (TID) | TRANSDERMAL | 4 refills | Status: AC

## 2017-02-26 NOTE — Progress Notes (Signed)
Subjective:    Patient ID: April [name], female    DOB: 02/12/1925, 82 y.o.   MRN: [id number]  HPI   April [name] is here in follow up of her chronic pain and gait disorder. She is dealing with her pain but is limited to walking at home essentially. She struggles with gait outside the home such as when she goes out to the store or for dinner.   Her daughter continues to be with her at home to provide 24 hour supervision.    For pain she is using oxycodone when she needs to.  The lidoderm patches and voltaren gel are very helpful also. She limits ibuprofen due to her age.   Pain Inventory Average Pain 5 Pain Right Now 5 My pain is dull and tingling  In the last 24 hours, has pain interfered with the following? General activity 4 Relation with others 4 Enjoyment of life 5 What TIME of day is your pain at its worst? evening Sleep (in general) Fair  Pain is worse with: walking and standing Pain improves with: rest and medication Relief from Meds: 4  Mobility use a walker ability to climb steps?  yes  Function retired  Neuro/Psych trouble walking  Prior Studies Any changes since last visit?  no  Physicians involved in your care Any changes since last visit?  no   No family history on file. Social History   Social History  . Marital status: Single    Spouse name: N/A  . Number of children: N/A  . Years of education: N/A   Social History Main Topics  . Smoking status: Never Smoker  . Smokeless tobacco: Never Used  . Alcohol use No  . Drug use: No  . Sexual activity: No   Other Topics Concern  . None   Social History Narrative  . None   Past Surgical History:  Procedure Laterality Date  . ADENOIDECTOMY    . CHOLECYSTECTOMY    . EYE SURGERY    . HIP ARTHROPLASTY Left 04/08/2014   Procedure: LEFT HIP HEMIARTHROPLASTY;  Surgeon: Verlee Rossetti, MD;  Location: [facility] OR;  Service: Orthopedics;  Laterality: Left;  . left cataract removed    . TONSILLECTOMY      Past Medical History:  Diagnosis Date  . Cataract    left eye  . Chronic back pain    stenosis  . Constipation    takes an OTC stool softener   . Diabetes mellitus without complication (HCC)    takes Lantus daily  . GERD (gastroesophageal reflux disease)    not on Protonix daily  . History of bronchitis 20 yrs ago  . History of lupus 38yrs ago  . History of shingles   . HTN (hypertension) 04/07/2014  . Hyperlipidemia 04/07/2014   takes Lovastatin daily  . Hypertension    takes Amlodipine and Atenolol daily  . Hypothyroidism 04/07/2014   takes Synthroid daily  . Joint pain   . Nocturia   . OA (osteoarthritis) 04/07/2014  . Peripheral edema   . Urinary frequency    takes Vesicare daily  . UTI (lower urinary tract infection)    completed antibioitcs on 07/28/14   BP 100/62   Pulse 67   SpO2 92%   Opioid Risk Score:   Fall Risk Score:  `1  Depression screen PHQ 2/9  Depression screen [facility] 2/9 05/30/2016 03/06/2016 01/06/2016 08/18/2015 07/16/2015 03/10/2015  Decreased Interest 0 1 0 0 0 0  Down, Depressed, Hopeless 0 0  0 0 0 0  PHQ - 2 Score 0 1 0 0 0 0  Altered sleeping - - - - - 0  Tired, decreased energy - - - - - 0  Change in appetite - - - - - 0  Feeling bad or failure about yourself  - - - - - 0  Trouble concentrating - - - - - 0  Moving slowly or fidgety/restless - - - - - 0  Suicidal thoughts - - - - - 0  PHQ-9 Score - - - - - 0    Review of Systems  Constitutional: Negative.   HENT: Negative.   Eyes: Negative.   Respiratory: Negative.   Cardiovascular: Negative.   Gastrointestinal: Negative.   Endocrine: Negative.   Genitourinary: Negative.   Musculoskeletal: Positive for back pain.  Skin: Negative.   Allergic/Immunologic: Negative.   Neurological: Negative.   Hematological: Negative.   Psychiatric/Behavioral: Negative.        Objective:   Physical Exam  Constitutional: She is oriented to person, place, and time. She appears well-developed and  well-nourished.  HENT: oral mucosa pink and moist  Head: Normocephalic and atraumatic.  Eyes: Conjunctivae are normal. Pupils are equal, round, and reactive to light.  Neck: Normal range of motion. Neck supple.  Cardiovascular: RRR  Respiratory: Effort normal  GI: Soft. Bowel sounds are normal. She exhibits no distension. There is no tenderness.  Musculoskeletal: Prominence of left greater troch  Utilizes walker for balance. Persistent left sided antalgia with weight bearing Neurological: She is alert and oriented to person, place, and time. No cranial nerve deficit. Coordination normal.  Quads,KE 5/5. ADF/APF 5/5. UE's grossly 5/5 prox to distal. mild stocking glove sensory loss in the feet (to ankles). -unchanged Skin: Skin is warm and dry.  Psychiatric: She has a normal mood and affect. Her behavior is normal. Thought content normal   Assessment/Plan:  1. Left Lumbar 3 radiculopathy/stenosis s/p L3-4 laminectomy and fusion  2. Diabetes with peripheral neuropathy: likely component of distal, bilateral leg pain  3. Heterotopic bone--left hip    Plan:  1. Continue with walker for balance. This is adequate for home mobility, but she's unable to get out in the community due to her pain and balance deficits. She would benefit form a scooter to allow her access to her community. She would also need an attachment device for her car as well as a ramp to store the scooter in their storage house. She is motivated and competent to use the scooter in the community.   Additionally, April [name] requires 24 hour supervision due to her condition. Her daughter has been providing this and has been best suited to provide this level of care moving forward.  2. Oxycodone for breakthrough pain 10mg  #60. This was refilled today We will continue the opioid monitoring program, this consists of regular clinic visits, examinations, urine drug screen, pill counts as well as use of [place]  Cont[place].   4. Continue with HEP  5. Ibuprofen PRN with food is fine for now. She is taking only up to 400mg  daily. Use sparingly 6. Lidoderm patch to left hip/leg PRN, voltaren gel.    Follow up with me in 4 months. 15minutes of face to face patient care time were spent during this visit. All questions were encouraged and answered. Reviewed with case manager also

## 2017-02-26 NOTE — Patient Instructions (Signed)
PLEASE FEEL FREE TO CALL OUR OFFICE WITH ANY PROBLEMS OR QUESTIONS ([phone])      

## 2017-03-02 DIAGNOSIS — Z6829 Body mass index (BMI) 29.0-29.9, adult: Secondary | ICD-10-CM | POA: Diagnosis not present

## 2017-03-02 DIAGNOSIS — N3000 Acute cystitis without hematuria: Secondary | ICD-10-CM | POA: Diagnosis not present

## 2017-03-02 DIAGNOSIS — N182 Chronic kidney disease, stage 2 (mild): Secondary | ICD-10-CM | POA: Diagnosis not present

## 2017-03-20 DIAGNOSIS — Z6829 Body mass index (BMI) 29.0-29.9, adult: Secondary | ICD-10-CM | POA: Diagnosis not present

## 2017-03-20 DIAGNOSIS — R300 Dysuria: Secondary | ICD-10-CM | POA: Diagnosis not present

## 2017-03-20 DIAGNOSIS — N390 Urinary tract infection, site not specified: Secondary | ICD-10-CM | POA: Diagnosis not present

## 2017-04-25 ENCOUNTER — Ambulatory Visit: Payer: Self-pay | Admitting: Registered Nurse

## 2017-05-04 DIAGNOSIS — D500 Iron deficiency anemia secondary to blood loss (chronic): Secondary | ICD-10-CM | POA: Diagnosis not present

## 2017-05-04 DIAGNOSIS — E1122 Type 2 diabetes mellitus with diabetic chronic kidney disease: Secondary | ICD-10-CM | POA: Diagnosis not present

## 2017-05-04 DIAGNOSIS — E039 Hypothyroidism, unspecified: Secondary | ICD-10-CM | POA: Diagnosis not present

## 2017-05-04 DIAGNOSIS — E782 Mixed hyperlipidemia: Secondary | ICD-10-CM | POA: Diagnosis not present

## 2017-05-04 DIAGNOSIS — I10 Essential (primary) hypertension: Secondary | ICD-10-CM | POA: Diagnosis not present

## 2017-05-04 DIAGNOSIS — E559 Vitamin D deficiency, unspecified: Secondary | ICD-10-CM | POA: Diagnosis not present

## 2017-05-10 DIAGNOSIS — E113293 Type 2 diabetes mellitus with mild nonproliferative diabetic retinopathy without macular edema, bilateral: Secondary | ICD-10-CM | POA: Diagnosis not present

## 2017-05-10 DIAGNOSIS — H2512 Age-related nuclear cataract, left eye: Secondary | ICD-10-CM | POA: Diagnosis not present

## 2017-05-11 DIAGNOSIS — M48062 Spinal stenosis, lumbar region with neurogenic claudication: Secondary | ICD-10-CM | POA: Diagnosis not present

## 2017-05-11 DIAGNOSIS — N182 Chronic kidney disease, stage 2 (mild): Secondary | ICD-10-CM | POA: Diagnosis not present

## 2017-05-11 DIAGNOSIS — D500 Iron deficiency anemia secondary to blood loss (chronic): Secondary | ICD-10-CM | POA: Diagnosis not present

## 2017-05-11 DIAGNOSIS — Z6830 Body mass index (BMI) 30.0-30.9, adult: Secondary | ICD-10-CM | POA: Diagnosis not present

## 2017-05-11 DIAGNOSIS — I5032 Chronic diastolic (congestive) heart failure: Secondary | ICD-10-CM | POA: Diagnosis not present

## 2017-05-11 DIAGNOSIS — E039 Hypothyroidism, unspecified: Secondary | ICD-10-CM | POA: Diagnosis not present

## 2017-05-11 DIAGNOSIS — E782 Mixed hyperlipidemia: Secondary | ICD-10-CM | POA: Diagnosis not present

## 2017-05-11 DIAGNOSIS — I10 Essential (primary) hypertension: Secondary | ICD-10-CM | POA: Diagnosis not present

## 2017-05-11 DIAGNOSIS — M5416 Radiculopathy, lumbar region: Secondary | ICD-10-CM | POA: Diagnosis not present

## 2017-05-11 DIAGNOSIS — E1122 Type 2 diabetes mellitus with diabetic chronic kidney disease: Secondary | ICD-10-CM | POA: Diagnosis not present

## 2017-06-04 DIAGNOSIS — I5032 Chronic diastolic (congestive) heart failure: Secondary | ICD-10-CM | POA: Diagnosis not present

## 2017-06-04 DIAGNOSIS — R600 Localized edema: Secondary | ICD-10-CM | POA: Diagnosis not present

## 2017-06-08 DIAGNOSIS — H2512 Age-related nuclear cataract, left eye: Secondary | ICD-10-CM | POA: Diagnosis not present

## 2017-06-21 ENCOUNTER — Telehealth: Payer: Self-pay | Admitting: Physical Medicine & Rehabilitation

## 2017-06-21 NOTE — Telephone Encounter (Signed)
Received voicemail from PagedaleMichelle [name] all regarding order for scooter - left voicemail at [phone] ext [phone] - we have not seen patient since February 26 2017 - didn't put in order - possibly wrk comp case worker?

## 2017-06-26 ENCOUNTER — Encounter
Payer: Worker's Compensation | Attending: Physical Medicine & Rehabilitation | Admitting: Physical Medicine & Rehabilitation

## 2017-07-18 ENCOUNTER — Encounter: Payer: Self-pay | Admitting: Physical Medicine & Rehabilitation

## 2017-07-18 ENCOUNTER — Encounter
Payer: Worker's Compensation | Attending: Physical Medicine & Rehabilitation | Admitting: Physical Medicine & Rehabilitation

## 2017-07-18 DIAGNOSIS — M329 Systemic lupus erythematosus, unspecified: Secondary | ICD-10-CM | POA: Diagnosis not present

## 2017-07-18 DIAGNOSIS — M899 Disorder of bone, unspecified: Secondary | ICD-10-CM | POA: Insufficient documentation

## 2017-07-18 DIAGNOSIS — I10 Essential (primary) hypertension: Secondary | ICD-10-CM | POA: Insufficient documentation

## 2017-07-18 DIAGNOSIS — Z794 Long term (current) use of insulin: Secondary | ICD-10-CM | POA: Insufficient documentation

## 2017-07-18 DIAGNOSIS — E039 Hypothyroidism, unspecified: Secondary | ICD-10-CM | POA: Insufficient documentation

## 2017-07-18 DIAGNOSIS — S72002S Fracture of unspecified part of neck of left femur, sequela: Secondary | ICD-10-CM | POA: Diagnosis not present

## 2017-07-18 DIAGNOSIS — Z87440 Personal history of urinary (tract) infections: Secondary | ICD-10-CM | POA: Insufficient documentation

## 2017-07-18 DIAGNOSIS — G8929 Other chronic pain: Secondary | ICD-10-CM | POA: Insufficient documentation

## 2017-07-18 DIAGNOSIS — M5416 Radiculopathy, lumbar region: Secondary | ICD-10-CM | POA: Insufficient documentation

## 2017-07-18 DIAGNOSIS — R350 Frequency of micturition: Secondary | ICD-10-CM | POA: Insufficient documentation

## 2017-07-18 DIAGNOSIS — M79605 Pain in left leg: Secondary | ICD-10-CM | POA: Diagnosis not present

## 2017-07-18 DIAGNOSIS — Z79899 Other long term (current) drug therapy: Secondary | ICD-10-CM | POA: Diagnosis not present

## 2017-07-18 DIAGNOSIS — K219 Gastro-esophageal reflux disease without esophagitis: Secondary | ICD-10-CM | POA: Diagnosis not present

## 2017-07-18 DIAGNOSIS — M1990 Unspecified osteoarthritis, unspecified site: Secondary | ICD-10-CM | POA: Insufficient documentation

## 2017-07-18 DIAGNOSIS — E785 Hyperlipidemia, unspecified: Secondary | ICD-10-CM | POA: Diagnosis not present

## 2017-07-18 DIAGNOSIS — E1142 Type 2 diabetes mellitus with diabetic polyneuropathy: Secondary | ICD-10-CM | POA: Diagnosis not present

## 2017-07-18 DIAGNOSIS — M898X9 Other specified disorders of bone, unspecified site: Secondary | ICD-10-CM

## 2017-07-18 DIAGNOSIS — M79604 Pain in right leg: Secondary | ICD-10-CM | POA: Insufficient documentation

## 2017-07-18 DIAGNOSIS — Z981 Arthrodesis status: Secondary | ICD-10-CM | POA: Diagnosis not present

## 2017-07-18 MED ORDER — OXYCODONE HCL 10 MG PO TABS: 10.0000 mg | ORAL_TABLET | Freq: Three times a day (TID) | ORAL | 0 refills | Status: DC | PRN

## 2017-07-18 NOTE — Progress Notes (Signed)
Subjective:    Patient ID: April [name], female    DOB: 03/02/1925, 83 y.o.   MRN: [id number]  HPI   April [name] is here in follow up of her chronic pain. Her pain levels remain constant but manageable. She is using oxycodone for pain control which helps manage her pain. She is using oxycodone 10mg  only once or twice per week. She is using advil 200mg  daily when she doesn't use oxycodone.   She fell about 2 weeks ago getting out of the shower. She hit her head mildly and hasnt suffered any repercussions.   Bowels are working fairly well. She is off miralax currently and uses diet to help control.   For exercise she does water aerobics once a week. She is doing some modest walking around the house.   Pain Inventory Average Pain 4 Pain Right Now 5 My pain is .  In the last 24 hours, has pain interfered with the following? General activity 6 Relation with others 6 Enjoyment of life 6 What TIME of day is your pain at its worst? evening Sleep (in general) Fair  Pain is worse with: walking Pain improves with: . Relief from Meds: .  Mobility walk with assistance use a walker  Function Do you have any goals in this area?  no  Neuro/Psych bladder control problems tingling  Prior Studies Any changes since last visit?  no  Physicians involved in your care Any changes since last visit?  no   No family history on file. Social History   Social History  . Marital status: Single    Spouse name: N/A  . Number of children: N/A  . Years of education: N/A   Social History Main Topics  . Smoking status: Never Smoker  . Smokeless tobacco: Never Used  . Alcohol use No  . Drug use: No  . Sexual activity: No   Other Topics Concern  . Not on file   Social History Narrative  . No narrative on file   Past Surgical History:  Procedure Laterality Date  . ADENOIDECTOMY    . CHOLECYSTECTOMY    . EYE SURGERY    . HIP ARTHROPLASTY Left 04/08/2014   Procedure: LEFT  HIP HEMIARTHROPLASTY;  Surgeon: Verlee RossettiSteven R Norris, MD;  [facility];  Service: Orthopedics;  Laterality: Left;  . left cataract removed    . TONSILLECTOMY     Past Medical History:  Diagnosis Date  . Cataract    left eye  . Chronic back pain    stenosis  . Constipation    takes an OTC stool softener   . Diabetes mellitus without complication (HCC)    takes Lantus daily  . GERD (gastroesophageal reflux disease)    not on Protonix daily  . History of bronchitis 20 yrs ago  . History of lupus 1685yrs ago  . History of shingles   . HTN (hypertension) 04/07/2014  . Hyperlipidemia 04/07/2014   takes Lovastatin daily  . Hypertension    takes Amlodipine and Atenolol daily  . Hypothyroidism 04/07/2014   takes Synthroid daily  . Joint pain   . Nocturia   . OA (osteoarthritis) 04/07/2014  . Peripheral edema   . Urinary frequency    takes Vesicare daily  . UTI (lower urinary tract infection)    completed antibioitcs on 07/28/14   There were no vitals taken for this visit.  Opioid Risk Score:   Fall Risk Score:  `1  Depression screen PHQ 2/9  Depression  screen [facility] 2/9 05/30/2016 03/06/2016 01/06/2016 08/18/2015 07/16/2015 03/10/2015  Decreased Interest 0 1 0 0 0 0  Down, Depressed, Hopeless 0 0 0 0 0 0  PHQ - 2 Score 0 1 0 0 0 0  Altered sleeping - - - - - 0  Tired, decreased energy - - - - - 0  Change in appetite - - - - - 0  Feeling bad or failure about yourself  - - - - - 0  Trouble concentrating - - - - - 0  Moving slowly or fidgety/restless - - - - - 0  Suicidal thoughts - - - - - 0  PHQ-9 Score - - - - - 0     Review of Systems  Constitutional: Negative.   HENT: Negative.   Eyes: Negative.   Respiratory: Negative.   Cardiovascular: Negative.   Gastrointestinal: Negative.   Endocrine: Negative.   Genitourinary: Negative.   Musculoskeletal: Negative.   Skin: Negative.   Allergic/Immunologic: Negative.   Neurological: Negative.   Hematological: Negative.     Psychiatric/Behavioral: Negative.   All other systems reviewed and are negative.      Objective:   Physical Exam  Constitutional: She is oriented to person, place, and time. She appears well-developed and well-nourished.  HENT: oral mucosa pink and moist  Head: Normocephalic and atraumatic.  Eyes: Conjunctivae are normal. Pupils are equal, round, and reactive to light.  Neck: Normal range of motion. Neck supple.  Cardiovascular: RRR  Respiratory: Effort normal  GI: Soft. Bowel sounds are normal. She exhibits no distension. There is no tenderness.  Musculoskeletal: left greater troch tender to palpation.   Utilizes walker for balance. Mild left sided antalgia with weight bearing. Needs some extra time to stand due to left hip pain.  Neurological: She is alert and oriented to person, place, and time. No cranial nerve deficit. Coordination normal.  Quads,KE 5/5. ADF/APF 5/5. UE's grossly 5/5 prox to distal. mild stocking glove sensory loss in the feet which has unchanged Skin: Skin is warm and dry.  Psychiatric: She has a normal mood and affect. Her behavior is normal. Thought content normal   Assessment/Plan:  1. Left Lumbar 3 radiculopathy/stenosis s/p L3-4 laminectomy and fusion  2. Diabetes with peripheral neuropathy: likely component of distal, bilateral leg pain  3. Heterotopic bone--left hip    Plan:   1. Continue with walker for balance. She remains unable to get out in the community due to her pain and balance deficits. She would benefit form a scooter to allow her access to her community. She would also need an attachment device for her car as well as a ramp to store the scooter in their storage house. She is motivated and competent to use the scooter in the community. She has yet to receive her scooter.     2. Oxycodone for breakthrough pain 10mg  #60. This was refilled today. We will continue the opioid monitoring program, this consists of regular clinic visits,  examinations, urine drug screen, pill counts as well as use of [place] Cont[place]. [place].     4. Continue with HEP. Encouraged her to increase ambulation as possible.  5. Ibuprofen PRN with food is fine for now. She is taking only up to 400mg  daily. Use sparingly 6. Lidoderm patch to left hip/leg PRN, voltaren gel.    Follow up with me in 4months. 15minutes of face to face patient care time were spent during this visit. All questions  were encouraged and answered. Greater than 50% of time during this encounter was spent counseling patient/family in regard to pain control, safety, adaptive devices.

## 2017-07-18 NOTE — Patient Instructions (Signed)
PLEASE FEEL FREE TO CALL OUR OFFICE WITH ANY PROBLEMS OR QUESTIONS [phone])    INCREASE YOUR ACTIVITY AS TOLERATED. SEE IF YOU CAN WALK A BIT MORE AROUND THE HOME!!

## 2017-07-25 DIAGNOSIS — H1011 Acute atopic conjunctivitis, right eye: Secondary | ICD-10-CM | POA: Diagnosis not present

## 2017-08-02 DIAGNOSIS — E119 Type 2 diabetes mellitus without complications: Secondary | ICD-10-CM | POA: Diagnosis not present

## 2017-08-02 DIAGNOSIS — E782 Mixed hyperlipidemia: Secondary | ICD-10-CM | POA: Diagnosis not present

## 2017-08-02 DIAGNOSIS — I10 Essential (primary) hypertension: Secondary | ICD-10-CM | POA: Diagnosis not present

## 2017-08-06 DIAGNOSIS — N182 Chronic kidney disease, stage 2 (mild): Secondary | ICD-10-CM | POA: Diagnosis not present

## 2017-08-06 DIAGNOSIS — I5032 Chronic diastolic (congestive) heart failure: Secondary | ICD-10-CM | POA: Diagnosis not present

## 2017-08-06 DIAGNOSIS — R350 Frequency of micturition: Secondary | ICD-10-CM | POA: Diagnosis not present

## 2017-08-06 DIAGNOSIS — R2681 Unsteadiness on feet: Secondary | ICD-10-CM | POA: Diagnosis not present

## 2017-08-06 DIAGNOSIS — Z6829 Body mass index (BMI) 29.0-29.9, adult: Secondary | ICD-10-CM | POA: Diagnosis not present

## 2017-08-06 DIAGNOSIS — I10 Essential (primary) hypertension: Secondary | ICD-10-CM | POA: Diagnosis not present

## 2017-08-06 DIAGNOSIS — Z1389 Encounter for screening for other disorder: Secondary | ICD-10-CM | POA: Diagnosis not present

## 2017-08-06 DIAGNOSIS — E1122 Type 2 diabetes mellitus with diabetic chronic kidney disease: Secondary | ICD-10-CM | POA: Diagnosis not present

## 2017-10-08 IMAGING — CT CT L SPINE W/ CM
1 of 6 series · 5 of 14 positions shown, 7 images · non-contrast
Comparison: Lumbar spine MRI 03/24/2015 from [REDACTED]
[REDACTED]

CLINICAL DATA: Left-sided low back pain, left hip pain, and lateral
left thigh pain.
TECHNIQUE: Contiguous axial images were obtained through the Lumbar spine after
the intrathecal infusion of contrast. Coronal and sagittal
reconstructions were obtained of the axial image sets.

[Series 2: l spine soft (person_name) · axial · 0.27mm/px · z∈[-319,-148]mm · 5 of 86 slices shown, 7 images]
[im 15/86  soft-tissue]
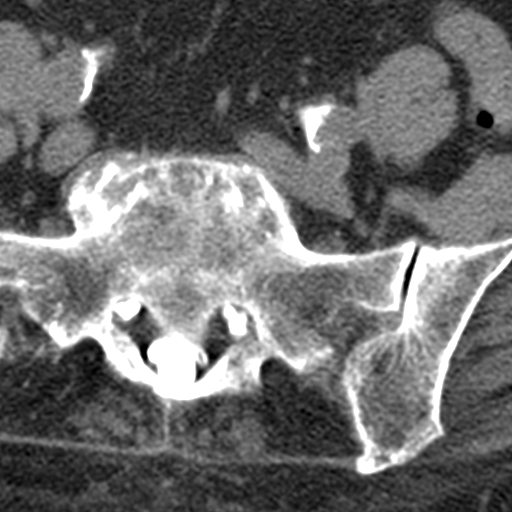
[im 15/86  bone]
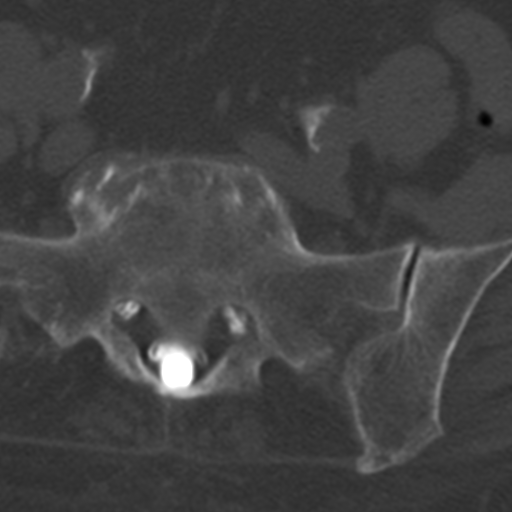
[im 29/86  bone]
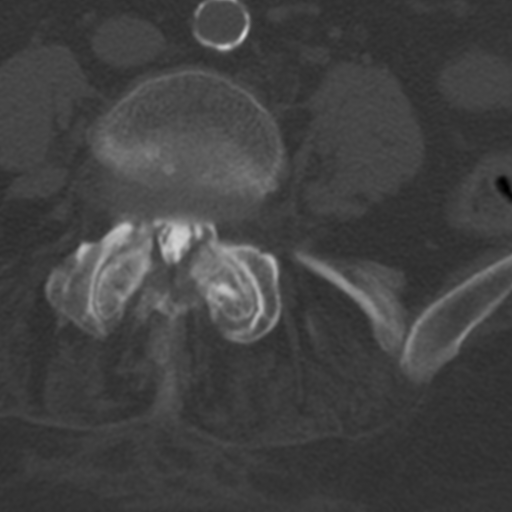
[im 43/86  bone]
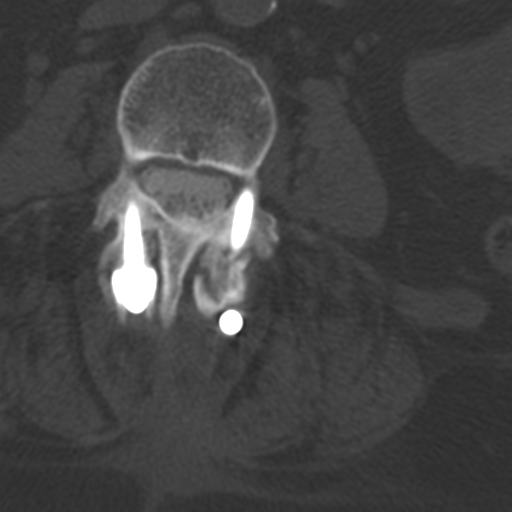
[im 57/86  bone]
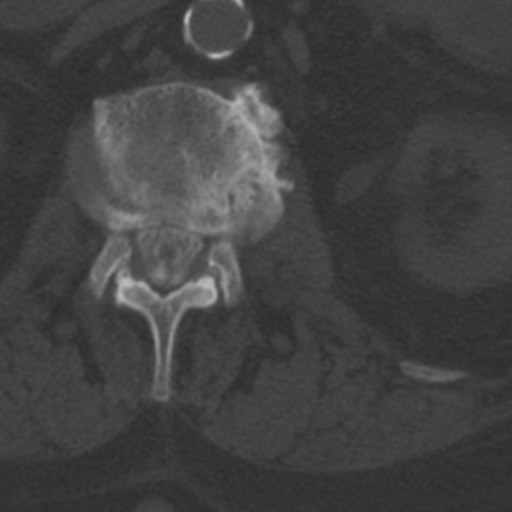
[im 71/86  soft-tissue]
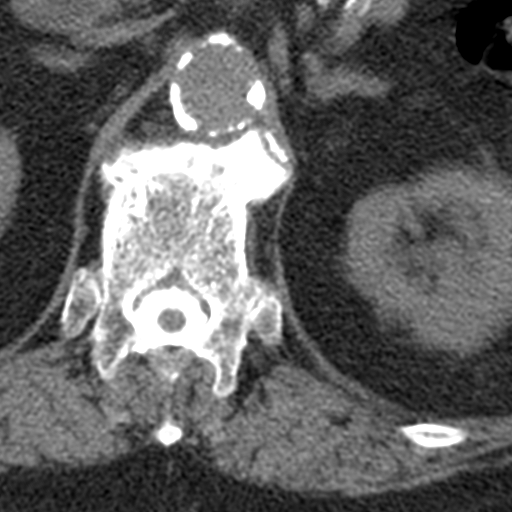
[im 71/86  bone]
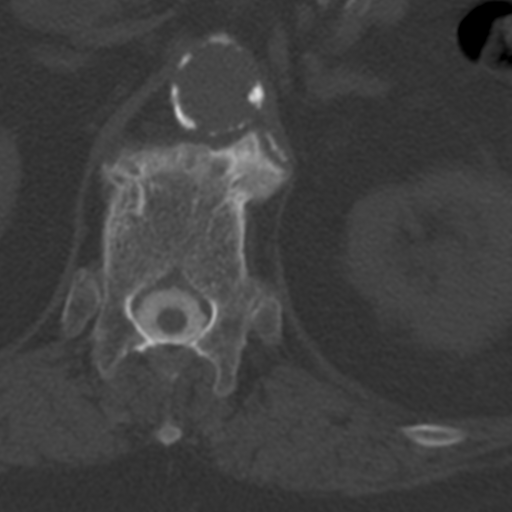

[5 of 14 positions shown; findings below may reference images not displayed]

EXAM:
LUMBAR MYELOGRAM

FLUOROSCOPY TIME:  Fluoroscopy Time (in minutes and seconds): 0
minutes 48 seconds

Number of Acquired Images:  12

PROCEDURE:
After thorough discussion of risks and benefits of the procedure
including bleeding, infection, injury to nerves, blood vessels,
adjacent structures as well as headache and CSF leak, written and
oral informed consent was obtained. Consent was obtained by Dr.
Manuella Tiger. Time out form was completed.

Patient was positioned prone on the fluoroscopy table. Local
anesthesia was provided with 1% lidocaine without epinephrine after
prepped and draped in the usual sterile fashion. Puncture was
performed at L2-3 using a 3 1/2 inch 22-gauge spinal needle via a
right paramedian approach. Using a single pass through the dura, the
needle was placed within the thecal sac, with return of clear CSF.
15 mL of Isovue-M 200 was injected into the thecal sac, with normal
opacification of the nerve roots and cauda equina consistent with
free flow within the subarachnoid space.

I personally performed the lumbar puncture and administered the
intrathecal contrast. I also personally supervised acquisition of
the myelogram images.
FINDINGS: LUMBAR MYELOGRAM FINDINGS:

There is grade 1 retrolisthesis of T12 on L1, L1 on L2, and L2 on L3
and grade 1 anterolisthesis of L4 on L5 without significant change
during flexion or extension. Sequelae of L3-4 fusion are again
identified. Endplate spurring contributes to ventral extradural
defects from T12-L1 to L2-3 with at most mild spinal stenosis.
Bilateral lateral recess narrowing is also seen at L4-5. There is
evidence of prominent left lateral recess stenosis at L1-2. Diffuse
atherosclerotic vascular calcification and a left hip prosthesis are
noted.

CT LUMBAR MYELOGRAM FINDINGS:

There is grade 1 retrolisthesis of T12 on L1, L1 on L2, and L2 on L3
and grade 1 anterolisthesis of L4 on L5. Vertebral body heights are
preserved without evidence of compression fracture. Sequelae of
prior posterior and interbody fusion are again identified at L3-4.
Solid intervertebral fusion is not definitely identified. Bilateral
pedicle screws are in place at L3 and L4 without evidence of
loosening.

Moderate disc space narrowing and vacuum disc phenomenon are present
at T12-L1 and L1-2. There is also mild vacuum disc at L2-3 and L4-5.
The conus medullaris terminates at L1-2. Diffuse atherosclerotic
calcification is noted of the abdominal aorta and its major branch
vessels.

T11-12: Minimal disc bulging and minimal facet arthrosis without
stenosis, unchanged.

T12-L1: Listhesis, disc bulging, left foraminal endplate
osteophytes, and mild facet and ligamentum flavum hypertrophy result
in mild left lateral recess and mild left neural foraminal stenosis
without spinal stenosis, grossly unchanged.

L1-2: Disc bulging, left subarticular disc osteophyte complex, and
moderate facet and ligamentum flavum hypertrophy result in mild
spinal stenosis, mild right and moderate to severe left lateral
recess stenosis, and moderate bilateral neural foraminal stenosis,
stable to minimally progressed from prior. The left L2 nerve root
may be affected in the lateral recess.

L2-3: Circumferential disc bulging and severe facet and ligamentum
flavum hypertrophy result in mild spinal stenosis and
mild-to-moderate bilateral neural foraminal stenosis, not
significantly changed.

L3-4: Prior posterior decompression and fusion. Spinal canal is
widely patent. There is minimal residual osseous right neural
foraminal narrowing.

L4-5: Listhesis with disc uncovering and severe bilateral facet
hypertrophy result in mild spinal stenosis and mild bilateral
lateral recess stenosis without significant neural foraminal
stenosis.

L5-S1:  Fusion across the disc space.  No stenosis.
IMPRESSION: 1. Prior L3-4 posterior decompression without significant residual
stenosis as above. No definite solid intervertebral osseous fusion.
2. Severe facet hypertrophy at L4-5 with mild spinal stenosis and
mild bilateral lateral recess stenosis.
3. Mild spinal stenosis and moderate to severe left lateral recess
stenosis at L1-2 affecting the left L2 nerve root.
4. Mild spinal stenosis and mild-to-moderate bilateral foraminal
stenosis at L2-3.

## 2017-10-26 DIAGNOSIS — E559 Vitamin D deficiency, unspecified: Secondary | ICD-10-CM | POA: Diagnosis not present

## 2017-10-26 DIAGNOSIS — I10 Essential (primary) hypertension: Secondary | ICD-10-CM | POA: Diagnosis not present

## 2017-10-26 DIAGNOSIS — E782 Mixed hyperlipidemia: Secondary | ICD-10-CM | POA: Diagnosis not present

## 2017-10-26 DIAGNOSIS — E1122 Type 2 diabetes mellitus with diabetic chronic kidney disease: Secondary | ICD-10-CM | POA: Diagnosis not present

## 2017-10-26 DIAGNOSIS — E039 Hypothyroidism, unspecified: Secondary | ICD-10-CM | POA: Diagnosis not present

## 2017-10-26 DIAGNOSIS — D500 Iron deficiency anemia secondary to blood loss (chronic): Secondary | ICD-10-CM | POA: Diagnosis not present

## 2017-10-26 DIAGNOSIS — I5032 Chronic diastolic (congestive) heart failure: Secondary | ICD-10-CM | POA: Diagnosis not present

## 2017-10-31 DIAGNOSIS — I10 Essential (primary) hypertension: Secondary | ICD-10-CM | POA: Diagnosis not present

## 2017-10-31 DIAGNOSIS — N182 Chronic kidney disease, stage 2 (mild): Secondary | ICD-10-CM | POA: Diagnosis not present

## 2017-10-31 DIAGNOSIS — Z6828 Body mass index (BMI) 28.0-28.9, adult: Secondary | ICD-10-CM | POA: Diagnosis not present

## 2017-10-31 DIAGNOSIS — M5416 Radiculopathy, lumbar region: Secondary | ICD-10-CM | POA: Diagnosis not present

## 2017-10-31 DIAGNOSIS — M48062 Spinal stenosis, lumbar region with neurogenic claudication: Secondary | ICD-10-CM | POA: Diagnosis not present

## 2017-10-31 DIAGNOSIS — I5032 Chronic diastolic (congestive) heart failure: Secondary | ICD-10-CM | POA: Diagnosis not present

## 2017-10-31 DIAGNOSIS — E1122 Type 2 diabetes mellitus with diabetic chronic kidney disease: Secondary | ICD-10-CM | POA: Diagnosis not present

## 2017-10-31 DIAGNOSIS — J189 Pneumonia, unspecified organism: Secondary | ICD-10-CM | POA: Diagnosis not present

## 2017-11-14 DIAGNOSIS — Z23 Encounter for immunization: Secondary | ICD-10-CM | POA: Diagnosis not present

## 2017-11-19 ENCOUNTER — Encounter
Payer: Worker's Compensation | Attending: Physical Medicine & Rehabilitation | Admitting: Physical Medicine & Rehabilitation

## 2017-11-19 ENCOUNTER — Other Ambulatory Visit: Payer: Self-pay

## 2017-11-19 ENCOUNTER — Encounter: Payer: Self-pay | Admitting: Physical Medicine & Rehabilitation

## 2017-11-19 VITALS — BP 119/74 | HR 69

## 2017-11-19 DIAGNOSIS — Z981 Arthrodesis status: Secondary | ICD-10-CM | POA: Diagnosis not present

## 2017-11-19 DIAGNOSIS — M5416 Radiculopathy, lumbar region: Secondary | ICD-10-CM | POA: Insufficient documentation

## 2017-11-19 DIAGNOSIS — M329 Systemic lupus erythematosus, unspecified: Secondary | ICD-10-CM | POA: Insufficient documentation

## 2017-11-19 DIAGNOSIS — G8929 Other chronic pain: Secondary | ICD-10-CM | POA: Diagnosis present

## 2017-11-19 DIAGNOSIS — M7918 Myalgia, other site: Secondary | ICD-10-CM

## 2017-11-19 DIAGNOSIS — E1142 Type 2 diabetes mellitus with diabetic polyneuropathy: Secondary | ICD-10-CM | POA: Insufficient documentation

## 2017-11-19 DIAGNOSIS — Z5181 Encounter for therapeutic drug level monitoring: Secondary | ICD-10-CM

## 2017-11-19 DIAGNOSIS — K5900 Constipation, unspecified: Secondary | ICD-10-CM | POA: Insufficient documentation

## 2017-11-19 DIAGNOSIS — Z794 Long term (current) use of insulin: Secondary | ICD-10-CM | POA: Diagnosis not present

## 2017-11-19 DIAGNOSIS — M1990 Unspecified osteoarthritis, unspecified site: Secondary | ICD-10-CM | POA: Insufficient documentation

## 2017-11-19 DIAGNOSIS — M898X9 Other specified disorders of bone, unspecified site: Secondary | ICD-10-CM | POA: Diagnosis not present

## 2017-11-19 DIAGNOSIS — Z79890 Hormone replacement therapy (postmenopausal): Secondary | ICD-10-CM | POA: Diagnosis not present

## 2017-11-19 DIAGNOSIS — Z79899 Other long term (current) drug therapy: Secondary | ICD-10-CM | POA: Insufficient documentation

## 2017-11-19 DIAGNOSIS — Z96642 Presence of left artificial hip joint: Secondary | ICD-10-CM | POA: Insufficient documentation

## 2017-11-19 DIAGNOSIS — I10 Essential (primary) hypertension: Secondary | ICD-10-CM | POA: Insufficient documentation

## 2017-11-19 DIAGNOSIS — Z87440 Personal history of urinary (tract) infections: Secondary | ICD-10-CM | POA: Insufficient documentation

## 2017-11-19 DIAGNOSIS — E785 Hyperlipidemia, unspecified: Secondary | ICD-10-CM | POA: Diagnosis not present

## 2017-11-19 DIAGNOSIS — S72002S Fracture of unspecified part of neck of left femur, sequela: Secondary | ICD-10-CM | POA: Diagnosis not present

## 2017-11-19 DIAGNOSIS — Z79891 Long term (current) use of opiate analgesic: Secondary | ICD-10-CM | POA: Insufficient documentation

## 2017-11-19 DIAGNOSIS — K219 Gastro-esophageal reflux disease without esophagitis: Secondary | ICD-10-CM | POA: Insufficient documentation

## 2017-11-19 DIAGNOSIS — Z9049 Acquired absence of other specified parts of digestive tract: Secondary | ICD-10-CM | POA: Insufficient documentation

## 2017-11-19 DIAGNOSIS — G894 Chronic pain syndrome: Secondary | ICD-10-CM | POA: Diagnosis not present

## 2017-11-19 DIAGNOSIS — E039 Hypothyroidism, unspecified: Secondary | ICD-10-CM | POA: Insufficient documentation

## 2017-11-19 DIAGNOSIS — M48061 Spinal stenosis, lumbar region without neurogenic claudication: Secondary | ICD-10-CM | POA: Diagnosis not present

## 2017-11-19 DIAGNOSIS — M898X8 Other specified disorders of bone, other site: Secondary | ICD-10-CM | POA: Diagnosis not present

## 2017-11-19 DIAGNOSIS — Z8619 Personal history of other infectious and parasitic diseases: Secondary | ICD-10-CM | POA: Insufficient documentation

## 2017-11-19 MED ORDER — OXYCODONE HCL 10 MG PO TABS: 10.0000 mg | ORAL_TABLET | Freq: Three times a day (TID) | ORAL | 0 refills | Status: DC | PRN

## 2017-11-19 NOTE — Progress Notes (Signed)
Patient ID: April [name], female    DOB: 02/13/1925, 83 y.o.   MRN: [id number]  HPI  This is a follow up visit for April [name] who is here in follow up of her chronic pain. Her general pains are fairly stable. She has had a "new" pain in her left posterior temporal/occipital area for the past 3 weeks.  She states its worst when she is up and moving around.  It tends to feel better when she is sitting or lying down.  She is taking a few ibuprofen which seemed to help somewhat.  She still using oxycodone but on a intermittent basis at this point.  The ibuprofen seems to fill in the gaps for her fairly well for the most part.  She received a scooter which was the incorrect type of device initially.  Then a scooter was sent to her house which was not assembled.  Additionally they are unable to get the scooter into her car and have requested a carrier for the scooter but are running into some obstacles in this regard.    Pain Inventory Average Pain 6 Pain Right Now 4 My pain is sharp  In the last 24 hours, has pain interfered with the following? General activity 4 Relation with others 0 Enjoyment of life 5 What TIME of day is your pain at its worst? evening Sleep (in general) Fair  Pain is worse with: walking, bending, sitting and standing Pain improves with: rest and medication Relief from Meds: 6  Mobility use a walker how many minutes can you walk? 5 ability to climb steps?  yes do you drive?  yes  Function not employed: date last employed 04/08/14  Neuro/Psych bladder control problems  Prior Studies Any changes since last visit?  no  Physicians involved in your care Any changes since last visit?  no   No family history on file. Social History   Socioeconomic History  . Marital status: Single    Spouse name: Not on file  . Number of children: Not on file  . Years of education: Not on file  . Highest education level: Not on file  Social Needs  .  Financial resource strain: Not on file  . Food insecurity - worry: Not on file  . Food insecurity - inability: Not on file  . Transportation needs - medical: Not on file  . Transportation needs - non-medical: Not on file  Occupational History  . Not on file  Tobacco Use  . Smoking status: Never Smoker  . Smokeless tobacco: Never Used  Substance and Sexual Activity  . Alcohol use: No  . Drug use: No  . Sexual activity: No    Birth control/protection: Post-menopausal  Other Topics Concern  . Not on file  Social History Narrative  . Not on file   Past Surgical History:  Procedure Laterality Date  . ADENOIDECTOMY    . CHOLECYSTECTOMY    . EYE SURGERY    . HIP ARTHROPLASTY Left 04/08/2014   Procedure: LEFT HIP HEMIARTHROPLASTY;  Surgeon: Verlee Rossetti, MD;  [facility];  Service: Orthopedics;  Laterality: Left;  . left cataract removed    . TONSILLECTOMY     Past Medical History:  Diagnosis Date  . Cataract    left eye  . Chronic back pain    stenosis  . Constipation    takes an OTC stool softener   . Diabetes mellitus without complication (HCC)    takes  Lantus daily  . GERD (gastroesophageal reflux disease)    not on Protonix daily  . History of bronchitis 20 yrs ago  . History of lupus 2916yrs ago  . History of shingles   . HTN (hypertension) 04/07/2014  . Hyperlipidemia 04/07/2014   takes Lovastatin daily  . Hypertension    takes Amlodipine and Atenolol daily  . Hypothyroidism 04/07/2014   takes Synthroid daily  . Joint pain   . Nocturia   . OA (osteoarthritis) 04/07/2014  . Peripheral edema   . Urinary frequency    takes Vesicare daily  . UTI (lower urinary tract infection)    completed antibioitcs on 07/28/14   There were no vitals taken for this visit.  Opioid Risk Score:   Fall Risk Score:  `1  Depression screen PHQ 2/9  Depression screen [facility] 11/19/2017 05/30/2016 03/06/2016 01/06/2016 08/18/2015 07/16/2015 03/10/2015  Decreased Interest 0 0 1 0 0 0 0    Down, Depressed, Hopeless 0 0 0 0 0 0 0  PHQ - 2 Score 0 0 1 0 0 0 0  Altered sleeping - - - - - - 0  Tired, decreased energy - - - - - - 0  Change in appetite - - - - - - 0  Feeling bad or failure about yourself  - - - - - - 0  Trouble concentrating - - - - - - 0  Moving slowly or fidgety/restless - - - - - - 0  Suicidal thoughts - - - - - - 0  PHQ-9 Score - - - - - - 0      Review of Systems  Constitutional:       High blood sugar  HENT: Negative.   Eyes: Negative.   Respiratory: Negative.   Cardiovascular: Negative.   Gastrointestinal: Negative.   Endocrine: Negative.   Genitourinary: Negative.   Musculoskeletal: Negative.   Skin: Negative.   Allergic/Immunologic: Negative.   Neurological: Negative.   Hematological: Negative.   Psychiatric/Behavioral: Negative.        Objective:   Physical Exam  Constitutional: She is oriented to person, place, and time. She appears well-developed and well-nourished.  HENT: oral mucosa pink and moist  Head: Normocephalic and atraumatic.  Eyes: Conjunctivae are normal. Pupils are equal, round, and reactive to light.  Neck: Normal range of motion. Neck supple.  Cardiovascular: RRR Respiratory: Effort normal.  GI: Soft. Bowel sounds are normal. She exhibits no distension. There is no tenderness.  Musculoskeletal: Left trapezius tender to palpation.  Sternocleidomastoid lesser tender.  She has pain with rotation to the right as well as lateral bending to the left and right as well.  Forward flexion did cause some pain.  No frank occipital or temporal area tenderness.  antalgia left leg with gait. Uses walker.   Neurological: She is alert and oriented to person, place, and time. No cranial nerve deficit. Coordination normal.  Quads,KE 5/5. ADF/APF 5/5. UE's grossly 5/5 prox to distal. mild stocking glove sensory loss in the feet which has unchanged Skin: Skin is warm and dry.  Psychiatric: She has a normal mood and affect. Her  behavior is normal. Thought content normal   Assessment/Plan:  1. Left Lumbar 3 radiculopathy/stenosis s/p L3-4 laminectomy and fusion  2. Diabetes with peripheral neuropathy: likely component of distal, bilateral leg pain  3. Heterotopic bone--left hip    Plan:   1.Cervical ROM exercises were provided. Apply heat as well. Consider TPI if pain does not improve with  conservative measures..    2. Oxycodone for breakthrough pain 10mg  #60. This was refilled today. We will continue the opioid monitoring program, this consists of regular clinic visits, examinations, urine drug screen, pill counts as well as use of [place] Cont[place]. [place].   -drug Sample was collected today  via swab 4. Continue with HEP. Encouraged her to increase ambulation as possible.  5. Ibuprofen PRN with food is fine as long as she is taking it on a limited basis and not experiencing any side effects. 6. Lidoderm patch to left hip/leg PRN, voltaren gel.  7.  Completed paperwork to assist her in getting a carrier for her scooter as well as assistance at home as her daughter cannot help her from a physical standpoint given her own medical conditions.  Follow up with me in 4months. 15minutes of face to face patient care time were spent during this visit. All questions were encouraged and answered. .Marland Kitchen

## 2017-11-19 NOTE — Patient Instructions (Signed)
PLEASE FEEL FREE TO CALL OUR OFFICE WITH ANY PROBLEMS OR QUESTIONS ([phone])      

## 2017-11-22 LAB — DRUG TOX MONITOR 1 W/CONF, ORAL FLD
AMPHETAMINES: NEGATIVE ng/mL (ref <10)
BARBITURATES: NEGATIVE ng/mL (ref <10)
BENZODIAZEPINES: NEGATIVE ng/mL (ref <0.50)
BUPRENORPHINE: NEGATIVE ng/mL (ref <0.10)
COCAINE: NEGATIVE ng/mL (ref <5.0)
Codeine: NEGATIVE ng/mL (ref <2.5)
Dihydrocodeine: NEGATIVE ng/mL (ref <2.5)
Fentanyl: NEGATIVE ng/mL (ref <0.10)
HEROIN METABOLITE: NEGATIVE ng/mL (ref <1.0)
HYDROCODONE: NEGATIVE ng/mL (ref <2.5)
Hydromorphone: NEGATIVE ng/mL (ref <2.5)
MARIJUANA: NEGATIVE ng/mL (ref <2.5)
MDMA: NEGATIVE ng/mL (ref <10)
MEPROBAMATE: NEGATIVE ng/mL (ref <2.5)
MORPHINE: NEGATIVE ng/mL (ref <2.5)
Methadone: NEGATIVE ng/mL (ref <5.0)
NORHYDROCODONE: NEGATIVE ng/mL (ref <2.5)
NOROXYCODONE: 4.3 ng/mL — AB (ref <2.5)
Nicotine Metabolite: NEGATIVE ng/mL (ref <5.0)
OPIATES: POSITIVE ng/mL — AB (ref <2.5)
Oxycodone: 43.1 ng/mL — ABNORMAL HIGH (ref <2.5)
Oxymorphone: NEGATIVE ng/mL (ref <2.5)
Phencyclidine: NEGATIVE ng/mL (ref <10)
Tapentadol: NEGATIVE ng/mL (ref <5.0)
Tramadol: NEGATIVE ng/mL (ref <5.0)
ZOLPIDEM: NEGATIVE ng/mL (ref <5.0)

## 2017-11-22 LAB — DRUG TOX ALC METAB W/CON, ORAL FLD: ALCOHOL METABOLITE: NEGATIVE ng/mL (ref <25)

## 2017-11-23 ENCOUNTER — Telehealth: Payer: Self-pay | Admitting: *Deleted

## 2017-11-23 NOTE — Telephone Encounter (Signed)
Oral swab drug screen was consistent for prescribed medications.  ?

## 2018-01-23 DIAGNOSIS — I5032 Chronic diastolic (congestive) heart failure: Secondary | ICD-10-CM | POA: Diagnosis not present

## 2018-01-23 DIAGNOSIS — I824Z3 Acute embolism and thrombosis of unspecified deep veins of distal lower extremity, bilateral: Secondary | ICD-10-CM | POA: Diagnosis not present

## 2018-01-23 DIAGNOSIS — E1122 Type 2 diabetes mellitus with diabetic chronic kidney disease: Secondary | ICD-10-CM | POA: Diagnosis not present

## 2018-01-23 DIAGNOSIS — E782 Mixed hyperlipidemia: Secondary | ICD-10-CM | POA: Diagnosis not present

## 2018-01-23 DIAGNOSIS — E039 Hypothyroidism, unspecified: Secondary | ICD-10-CM | POA: Diagnosis not present

## 2018-01-23 DIAGNOSIS — N182 Chronic kidney disease, stage 2 (mild): Secondary | ICD-10-CM | POA: Diagnosis not present

## 2018-01-23 DIAGNOSIS — E559 Vitamin D deficiency, unspecified: Secondary | ICD-10-CM | POA: Diagnosis not present

## 2018-01-23 DIAGNOSIS — I10 Essential (primary) hypertension: Secondary | ICD-10-CM | POA: Diagnosis not present

## 2018-01-23 DIAGNOSIS — D500 Iron deficiency anemia secondary to blood loss (chronic): Secondary | ICD-10-CM | POA: Diagnosis not present

## 2018-01-23 DIAGNOSIS — E119 Type 2 diabetes mellitus without complications: Secondary | ICD-10-CM | POA: Diagnosis not present

## 2018-01-29 DIAGNOSIS — D500 Iron deficiency anemia secondary to blood loss (chronic): Secondary | ICD-10-CM | POA: Diagnosis not present

## 2018-01-29 DIAGNOSIS — I5032 Chronic diastolic (congestive) heart failure: Secondary | ICD-10-CM | POA: Diagnosis not present

## 2018-01-29 DIAGNOSIS — N182 Chronic kidney disease, stage 2 (mild): Secondary | ICD-10-CM | POA: Diagnosis not present

## 2018-01-29 DIAGNOSIS — E782 Mixed hyperlipidemia: Secondary | ICD-10-CM | POA: Diagnosis not present

## 2018-01-29 DIAGNOSIS — I10 Essential (primary) hypertension: Secondary | ICD-10-CM | POA: Diagnosis not present

## 2018-01-29 DIAGNOSIS — M48062 Spinal stenosis, lumbar region with neurogenic claudication: Secondary | ICD-10-CM | POA: Diagnosis not present

## 2018-01-29 DIAGNOSIS — Z6829 Body mass index (BMI) 29.0-29.9, adult: Secondary | ICD-10-CM | POA: Diagnosis not present

## 2018-01-29 DIAGNOSIS — E1122 Type 2 diabetes mellitus with diabetic chronic kidney disease: Secondary | ICD-10-CM | POA: Diagnosis not present

## 2018-01-30 DIAGNOSIS — Z961 Presence of intraocular lens: Secondary | ICD-10-CM | POA: Diagnosis not present

## 2018-01-30 DIAGNOSIS — E113293 Type 2 diabetes mellitus with mild nonproliferative diabetic retinopathy without macular edema, bilateral: Secondary | ICD-10-CM | POA: Diagnosis not present

## 2018-03-19 ENCOUNTER — Encounter
Payer: Worker's Compensation | Attending: Physical Medicine & Rehabilitation | Admitting: Physical Medicine & Rehabilitation

## 2018-03-19 ENCOUNTER — Encounter: Payer: Self-pay | Admitting: Physical Medicine & Rehabilitation

## 2018-03-19 DIAGNOSIS — S72002S Fracture of unspecified part of neck of left femur, sequela: Secondary | ICD-10-CM | POA: Diagnosis not present

## 2018-03-19 DIAGNOSIS — E785 Hyperlipidemia, unspecified: Secondary | ICD-10-CM | POA: Insufficient documentation

## 2018-03-19 DIAGNOSIS — M898X9 Other specified disorders of bone, unspecified site: Secondary | ICD-10-CM | POA: Diagnosis not present

## 2018-03-19 DIAGNOSIS — E039 Hypothyroidism, unspecified: Secondary | ICD-10-CM | POA: Diagnosis not present

## 2018-03-19 DIAGNOSIS — M48061 Spinal stenosis, lumbar region without neurogenic claudication: Secondary | ICD-10-CM | POA: Insufficient documentation

## 2018-03-19 DIAGNOSIS — M329 Systemic lupus erythematosus, unspecified: Secondary | ICD-10-CM | POA: Diagnosis not present

## 2018-03-19 DIAGNOSIS — Z96642 Presence of left artificial hip joint: Secondary | ICD-10-CM | POA: Diagnosis not present

## 2018-03-19 DIAGNOSIS — M5416 Radiculopathy, lumbar region: Secondary | ICD-10-CM | POA: Diagnosis not present

## 2018-03-19 DIAGNOSIS — I10 Essential (primary) hypertension: Secondary | ICD-10-CM | POA: Diagnosis not present

## 2018-03-19 DIAGNOSIS — Z8619 Personal history of other infectious and parasitic diseases: Secondary | ICD-10-CM | POA: Diagnosis not present

## 2018-03-19 DIAGNOSIS — E1142 Type 2 diabetes mellitus with diabetic polyneuropathy: Secondary | ICD-10-CM | POA: Diagnosis not present

## 2018-03-19 DIAGNOSIS — M1990 Unspecified osteoarthritis, unspecified site: Secondary | ICD-10-CM | POA: Diagnosis not present

## 2018-03-19 DIAGNOSIS — K219 Gastro-esophageal reflux disease without esophagitis: Secondary | ICD-10-CM | POA: Diagnosis not present

## 2018-03-19 DIAGNOSIS — G8929 Other chronic pain: Secondary | ICD-10-CM | POA: Diagnosis present

## 2018-03-19 DIAGNOSIS — Z981 Arthrodesis status: Secondary | ICD-10-CM | POA: Insufficient documentation

## 2018-03-19 MED ORDER — OXYCODONE HCL 10 MG PO TABS: 10.0000 mg | ORAL_TABLET | Freq: Three times a day (TID) | ORAL | 0 refills | Status: DC | PRN

## 2018-03-19 NOTE — Patient Instructions (Signed)
YOU NEED TO STRETCH YOUR BACK, GLUTEAL MUSCLES AND HAMSTRINGS DAILY!!!   PLEASE FEEL FREE TO CALL OUR OFFICE WITH ANY PROBLEMS OR QUESTIONS [phone])

## 2018-03-19 NOTE — Progress Notes (Signed)
Subjective:    Patient ID: April [name], female    DOB: Feb 15, 1925, 84 y.o.   MRN: [id number]  HPI   [name] is here in follow up of her chronic pain. She states that she's having more pain and tingling in the LLE with occasional swelling. Symptoms are worst at night. She remains on oxycodone for pain control.  She states that she has not been exercising as much over the winter months.  She has not been able to get out to the [facility] either.  She does do some basic stretching exercises at home.  Her walking remains limited because of pain in her hip and leg.   Pain Inventory Average Pain 5 Pain Right Now 4 My pain is burning, stabbing and aching  In the last 24 hours, has pain interfered with the following? General activity 4 Relation with others 4 Enjoyment of life 6 What TIME of day is your pain at its worst? evening, night Sleep (in general) Fair  Pain is worse with: standing and some activites Pain improves with: rest and medication Relief from Meds: 7  Mobility walk with assistance use a walker how many minutes can you walk? 5-10 ability to climb steps?  no do you drive?  no  Function retired  Neuro/Psych bladder control problems  Prior Studies Any changes since last visit?  no  Physicians involved in your care Any changes since last visit?  no   No family history on file. Social History   Socioeconomic History  . Marital status: Single    Spouse name: Not on file  . Number of children: Not on file  . Years of education: Not on file  . Highest education level: Not on file  Occupational History  . Not on file  Social Needs  . Financial resource strain: Not on file  . Food insecurity:    Worry: Not on file    Inability: Not on file  . Transportation needs:    Medical: Not on file    Non-medical: Not on file  Tobacco Use  . Smoking status: Never Smoker  . Smokeless tobacco: Never Used  Substance and Sexual Activity  . Alcohol use: No  . Drug  use: No  . Sexual activity: Never    Birth control/protection: Post-menopausal  Lifestyle  . Physical activity:    Days per week: Not on file    Minutes per session: Not on file  . Stress: Not on file  Relationships  . Social connections:    Talks on phone: Not on file    Gets together: Not on file    Attends religious service: Not on file    Active member of club or organization: Not on file    Attends meetings of clubs or organizations: Not on file    Relationship status: Not on file  Other Topics Concern  . Not on file  Social History Narrative  . Not on file   Past Surgical History:  Procedure Laterality Date  . ADENOIDECTOMY    . CHOLECYSTECTOMY    . EYE SURGERY    . HIP ARTHROPLASTY Left 04/08/2014   Procedure: LEFT HIP HEMIARTHROPLASTY;  Surgeon: Verlee Rossetti, MD;  Location: [facility] OR;  Service: Orthopedics;  Laterality: Left;  . left cataract removed    . TONSILLECTOMY     Past Medical History:  Diagnosis Date  . Cataract    left eye  . Chronic back pain    stenosis  . Constipation  takes an OTC stool softener   . Diabetes mellitus without complication (HCC)    takes Lantus daily  . GERD (gastroesophageal reflux disease)    not on Protonix daily  . History of bronchitis 20 yrs ago  . History of lupus 68yrs ago  . History of shingles   . HTN (hypertension) 04/07/2014  . Hyperlipidemia 04/07/2014   takes Lovastatin daily  . Hypertension    takes Amlodipine and Atenolol daily  . Hypothyroidism 04/07/2014   takes Synthroid daily  . Joint pain   . Nocturia   . OA (osteoarthritis) 04/07/2014  . Peripheral edema   . Urinary frequency    takes Vesicare daily  . UTI (lower urinary tract infection)    completed antibioitcs on 07/28/14   BP 135/76 (BP Location: Left Arm, Patient Position: Sitting, Cuff Size: Normal)   Pulse 66   Resp 14   Ht 5\' 4"  (1.626 m)   Wt 179 lb (81.2 kg)   SpO2 93%   BMI 30.73 kg/m   Opioid Risk Score:   Fall Risk Score:   `1  Depression screen PHQ 2/9  Depression screen [facility] 2/9 11/19/2017 05/30/2016 03/06/2016 01/06/2016 08/18/2015 07/16/2015 03/10/2015  Decreased Interest 0 0 1 0 0 0 0  Down, Depressed, Hopeless 0 0 0 0 0 0 0  PHQ - 2 Score 0 0 1 0 0 0 0  Altered sleeping - - - - - - 0  Tired, decreased energy - - - - - - 0  Change in appetite - - - - - - 0  Feeling bad or failure about yourself  - - - - - - 0  Trouble concentrating - - - - - - 0  Moving slowly or fidgety/restless - - - - - - 0  Suicidal thoughts - - - - - - 0  PHQ-9 Score - - - - - - 0    Review of Systems  Constitutional: Negative.   HENT: Negative.   Eyes: Negative.   Respiratory: Negative.   Cardiovascular: Negative.   Gastrointestinal: Negative.   Endocrine: Negative.   Genitourinary: Positive for difficulty urinating.  Musculoskeletal: Positive for arthralgias, back pain and gait problem.  Skin: Negative.   Allergic/Immunologic: Negative.   Hematological: Negative.   Psychiatric/Behavioral: Negative.        Objective:   Physical Exam  General: No acute distress HEENT: EOMI, oral membranes moist Cards: reg rate  Chest: normal effort Abdomen: Soft, NT, ND Skin: dry, intact Extremities: no edema    Cardiovascular:RRR Respiratory:Effort normal.  GI: Soft. Bowel sounds are normal. She exhibits no distension. There is no tenderness.  Musculoskeletal:  Continues to display antalgia with gait on the left side.  She has tenderness along the left greater trochanter as well as the PSIS area and low back.. Uses walker for balance.  Neurological: She is alert and oriented to person, place, and time. No cranial nerve deficit. Coordination normal.  Quads,KE 5/5. ADF/APF 5/5. UE's grossly 5/5 prox to distal. mild stocking glove sensory loss in the feet which has unchanged.  She seems to have more involvement of the left lateral leg and previous.  Areas without allodynia or hypersensitivity. Skin: Skin is warm and dry.   Psychiatric: She has a normal mood and affect. Her behavior is normal. Thought content normal   Assessment/Plan:  1. Left Lumbar 3 radiculopathy/stenosis s/p L3-4 laminectomy and fusion.  I suspect she has some residual radiculopathy in the left lower extremity perhaps involving L3  and L4 2. Diabetes with peripheral neuropathy: likely component of distal, bilateral leg pain as well 3. Heterotopic bone--left hip    Plan:  1.Discussed ROM exercises for LLE. Needs to stretch her back/nerves to improve sensitivity and ROM in general. Would like [facility] to get back to the [facility] for water based activities.  2. Oxycodone for breakthrough pain 10mg  #60. RF today. We will continue the opioid monitoring program, this consists of regular clinic visits, examinations, routine drug screening, pill counts as well as use of [place] Controlled Sub[place]. [place] today.   3.  Could consider trial of gabapentin for neuropathic pain if it becomes more severe although I am hopeful that stretching and conservative approach will be sufficient 5. Ibuprofen PRN with food  on a limited basis.  6. Lidoderm patch to left hip/leg PRN, voltaren gel.    Follow up with me in424months. 15minutes of face to face patient care time were spent during this visit. All questions were encouraged and answered. Greater than 50% of time during this encounter was spent counseling patient/family in regard to potential options for pain relief, etiologies of pain, review of medication regimen.

## 2018-03-26 ENCOUNTER — Telehealth: Payer: Self-pay

## 2018-03-26 DIAGNOSIS — S72002S Fracture of unspecified part of neck of left femur, sequela: Secondary | ICD-10-CM

## 2018-03-26 DIAGNOSIS — M898X9 Other specified disorders of bone, unspecified site: Secondary | ICD-10-CM

## 2018-03-26 MED ORDER — OXYCODONE HCL 10 MG PO TABS: 10.0000 mg | ORAL_TABLET | Freq: Three times a day (TID) | ORAL | 0 refills | Status: DC | PRN

## 2018-03-26 NOTE — Telephone Encounter (Signed)
Med sent today at 6:39 pm

## 2018-03-26 NOTE — Telephone Encounter (Signed)
Patients daughter called, states when she was seen here on 03-19-18 that she forgot to check which pharmacy was her medications being sent to.  They have requested that the oxycodone that was prescribed to her be [facility] from [facility] as that is where all her benefits are currently located.  Patient currently only has 1-2 pills remaining from her last prescription.

## 2018-03-27 NOTE — Telephone Encounter (Signed)
Contacted patient and informed.

## 2018-04-30 DIAGNOSIS — E1122 Type 2 diabetes mellitus with diabetic chronic kidney disease: Secondary | ICD-10-CM | POA: Diagnosis not present

## 2018-04-30 DIAGNOSIS — E039 Hypothyroidism, unspecified: Secondary | ICD-10-CM | POA: Diagnosis not present

## 2018-04-30 DIAGNOSIS — E559 Vitamin D deficiency, unspecified: Secondary | ICD-10-CM | POA: Diagnosis not present

## 2018-04-30 DIAGNOSIS — N182 Chronic kidney disease, stage 2 (mild): Secondary | ICD-10-CM | POA: Diagnosis not present

## 2018-04-30 DIAGNOSIS — E782 Mixed hyperlipidemia: Secondary | ICD-10-CM | POA: Diagnosis not present

## 2018-04-30 DIAGNOSIS — I824Z3 Acute embolism and thrombosis of unspecified deep veins of distal lower extremity, bilateral: Secondary | ICD-10-CM | POA: Diagnosis not present

## 2018-04-30 DIAGNOSIS — I10 Essential (primary) hypertension: Secondary | ICD-10-CM | POA: Diagnosis not present

## 2018-04-30 DIAGNOSIS — D500 Iron deficiency anemia secondary to blood loss (chronic): Secondary | ICD-10-CM | POA: Diagnosis not present

## 2018-05-02 DIAGNOSIS — D500 Iron deficiency anemia secondary to blood loss (chronic): Secondary | ICD-10-CM | POA: Diagnosis not present

## 2018-05-02 DIAGNOSIS — M5416 Radiculopathy, lumbar region: Secondary | ICD-10-CM | POA: Diagnosis not present

## 2018-05-02 DIAGNOSIS — I10 Essential (primary) hypertension: Secondary | ICD-10-CM | POA: Diagnosis not present

## 2018-05-02 DIAGNOSIS — E1122 Type 2 diabetes mellitus with diabetic chronic kidney disease: Secondary | ICD-10-CM | POA: Diagnosis not present

## 2018-05-02 DIAGNOSIS — Z6829 Body mass index (BMI) 29.0-29.9, adult: Secondary | ICD-10-CM | POA: Diagnosis not present

## 2018-05-02 DIAGNOSIS — N182 Chronic kidney disease, stage 2 (mild): Secondary | ICD-10-CM | POA: Diagnosis not present

## 2018-05-02 DIAGNOSIS — R2681 Unsteadiness on feet: Secondary | ICD-10-CM | POA: Diagnosis not present

## 2018-05-02 DIAGNOSIS — M48062 Spinal stenosis, lumbar region with neurogenic claudication: Secondary | ICD-10-CM | POA: Diagnosis not present

## 2018-05-02 DIAGNOSIS — I5032 Chronic diastolic (congestive) heart failure: Secondary | ICD-10-CM | POA: Diagnosis not present

## 2018-05-02 DIAGNOSIS — N3001 Acute cystitis with hematuria: Secondary | ICD-10-CM | POA: Diagnosis not present

## 2018-05-02 DIAGNOSIS — M810 Age-related osteoporosis without current pathological fracture: Secondary | ICD-10-CM | POA: Diagnosis not present

## 2018-05-02 DIAGNOSIS — E782 Mixed hyperlipidemia: Secondary | ICD-10-CM | POA: Diagnosis not present

## 2018-07-10 ENCOUNTER — Other Ambulatory Visit: Payer: Self-pay

## 2018-07-10 ENCOUNTER — Encounter
Payer: Worker's Compensation | Attending: Physical Medicine & Rehabilitation | Admitting: Physical Medicine & Rehabilitation

## 2018-07-10 ENCOUNTER — Encounter: Payer: Self-pay | Admitting: Physical Medicine & Rehabilitation

## 2018-07-10 VITALS — BP 117/71 | HR 58 | Ht 64.0 in | Wt 179.0 lb

## 2018-07-10 DIAGNOSIS — I10 Essential (primary) hypertension: Secondary | ICD-10-CM | POA: Diagnosis not present

## 2018-07-10 DIAGNOSIS — Z5181 Encounter for therapeutic drug level monitoring: Secondary | ICD-10-CM | POA: Diagnosis not present

## 2018-07-10 DIAGNOSIS — Z79891 Long term (current) use of opiate analgesic: Secondary | ICD-10-CM

## 2018-07-10 DIAGNOSIS — K219 Gastro-esophageal reflux disease without esophagitis: Secondary | ICD-10-CM | POA: Insufficient documentation

## 2018-07-10 DIAGNOSIS — S72002S Fracture of unspecified part of neck of left femur, sequela: Secondary | ICD-10-CM | POA: Diagnosis not present

## 2018-07-10 DIAGNOSIS — M329 Systemic lupus erythematosus, unspecified: Secondary | ICD-10-CM | POA: Insufficient documentation

## 2018-07-10 DIAGNOSIS — E039 Hypothyroidism, unspecified: Secondary | ICD-10-CM | POA: Insufficient documentation

## 2018-07-10 DIAGNOSIS — M898X9 Other specified disorders of bone, unspecified site: Secondary | ICD-10-CM | POA: Diagnosis not present

## 2018-07-10 DIAGNOSIS — G8929 Other chronic pain: Secondary | ICD-10-CM | POA: Diagnosis present

## 2018-07-10 DIAGNOSIS — M48061 Spinal stenosis, lumbar region without neurogenic claudication: Secondary | ICD-10-CM | POA: Diagnosis not present

## 2018-07-10 DIAGNOSIS — E1142 Type 2 diabetes mellitus with diabetic polyneuropathy: Secondary | ICD-10-CM | POA: Insufficient documentation

## 2018-07-10 DIAGNOSIS — G894 Chronic pain syndrome: Secondary | ICD-10-CM | POA: Diagnosis not present

## 2018-07-10 DIAGNOSIS — M5416 Radiculopathy, lumbar region: Secondary | ICD-10-CM | POA: Diagnosis not present

## 2018-07-10 DIAGNOSIS — E785 Hyperlipidemia, unspecified: Secondary | ICD-10-CM | POA: Insufficient documentation

## 2018-07-10 MED ORDER — OXYCODONE HCL 10 MG PO TABS: 10.0000 mg | ORAL_TABLET | Freq: Three times a day (TID) | ORAL | 0 refills | Status: DC | PRN

## 2018-07-10 NOTE — Patient Instructions (Addendum)
PLEASE FEEL FREE TO CALL OUR OFFICE WITH ANY PROBLEMS OR QUESTIONS [phone])     STAY ACTIVE!!!   NEOPRENE ANKLE SLEEVES, TEDS TO HELP WITH SWELLING AT YOUR ANKLES.

## 2018-07-10 NOTE — Progress Notes (Signed)
Subjective:    Patient ID: April [name], female    DOB: 10/29/1925, 84 y.o.   MRN: [id number]  HPI   April [name] is here in follow up of her chronic pain. She has been doing fairly well. She has experienced some swelling in her ankles over the last several weeks and had questions in this regard. She denies weight gain or SOB. She does admit to being more sedentary overall. Her daughter helps motivating her to get up and move around the house, but she's not doing much more. She had been going to the [facility] previously but has gotten away from this a bit.   For pain she's using oxycodone. She's taking one per day as needed. Her low back and left leg/hip remain as her primary pain generators.     Pain Inventory Average Pain 5 Pain Right Now 4 My pain is dull  In the last 24 hours, has pain interfered with the following? General activity 4 Relation with others 4 Enjoyment of life 4 What TIME of day is your pain at its worst? evening night Sleep (in general) Fair  Pain is worse with: walking and standing Pain improves with: medication Relief from Meds: 5  Mobility walk with assistance use a walker ability to climb steps?  no do you drive?  no  Function not employed: date last employed n/a  Neuro/Psych bladder control problems  Prior Studies Any changes since last visit?  no  Physicians involved in your care Any changes since last visit?  no   No family history on file. Social History   Socioeconomic History  . Marital status: Single    Spouse name: Not on file  . Number of children: Not on file  . Years of education: Not on file  . Highest education level: Not on file  Occupational History  . Not on file  Social Needs  . Financial resource strain: Not on file  . Food insecurity:    Worry: Not on file    Inability: Not on file  . Transportation needs:    Medical: Not on file    Non-medical: Not on file  Tobacco Use  . Smoking status: Never Smoker    . Smokeless tobacco: Never Used  Substance and Sexual Activity  . Alcohol use: No  . Drug use: No  . Sexual activity: Never    Birth control/protection: Post-menopausal  Lifestyle  . Physical activity:    Days per week: Not on file    Minutes per session: Not on file  . Stress: Not on file  Relationships  . Social connections:    Talks on phone: Not on file    Gets together: Not on file    Attends religious service: Not on file    Active member of club or organization: Not on file    Attends meetings of clubs or organizations: Not on file    Relationship status: Not on file  Other Topics Concern  . Not on file  Social History Narrative  . Not on file   Past Surgical History:  Procedure Laterality Date  . ADENOIDECTOMY    . CHOLECYSTECTOMY    . EYE SURGERY    . HIP ARTHROPLASTY Left 04/08/2014   Procedure: LEFT HIP HEMIARTHROPLASTY;  Surgeon: Verlee Rossetti, MD;  Location: [facility] OR;  Service: Orthopedics;  Laterality: Left;  . left cataract removed    . TONSILLECTOMY     Past Medical History:  Diagnosis Date  . Cataract  left eye  . Chronic back pain    stenosis  . Constipation    takes an OTC stool softener   . Diabetes mellitus without complication (HCC)    takes Lantus daily  . GERD (gastroesophageal reflux disease)    not on Protonix daily  . History of bronchitis 20 yrs ago  . History of lupus (HCC) 44yrs ago  . History of shingles   . HTN (hypertension) 04/07/2014  . Hyperlipidemia 04/07/2014   takes Lovastatin daily  . Hypertension    takes Amlodipine and Atenolol daily  . Hypothyroidism 04/07/2014   takes Synthroid daily  . Joint pain   . Nocturia   . OA (osteoarthritis) 04/07/2014  . Peripheral edema   . Urinary frequency    takes Vesicare daily  . UTI (lower urinary tract infection)    completed antibioitcs on 07/28/14   BP 117/71   Pulse (!) 58   Ht 5\' 4"  (1.626 m)   Wt 179 lb (81.2 kg)   SpO2 93%   BMI 30.73 kg/m   Opioid Risk Score:    Fall Risk Score:  `1  Depression screen PHQ 2/9  Depression screen [facility] 2/9 07/10/2018 11/19/2017 05/30/2016 03/06/2016 01/06/2016 08/18/2015 07/16/2015  Decreased Interest 1 0 0 1 0 0 0  Down, Depressed, Hopeless 1 0 0 0 0 0 0  PHQ - 2 Score 2 0 0 1 0 0 0  Altered sleeping - - - - - - -  Tired, decreased energy - - - - - - -  Change in appetite - - - - - - -  Feeling bad or failure about yourself  - - - - - - -  Trouble concentrating - - - - - - -  Moving slowly or fidgety/restless - - - - - - -  Suicidal thoughts - - - - - - -  PHQ-9 Score - - - - - - -    Review of Systems  Constitutional: Negative.   HENT: Negative.   Eyes: Negative.   Respiratory: Negative.   Cardiovascular: Negative.   Gastrointestinal: Negative.   Endocrine: Negative.   Genitourinary: Negative.   Musculoskeletal: Negative.   Skin: Negative.   Allergic/Immunologic: Negative.   Neurological: Negative.   Hematological: Negative.   Psychiatric/Behavioral: Negative.   All other systems reviewed and are negative.      Objective:   Physical Exam General: No acute distress HEENT: EOMI, oral membranes moist Cards: reg rate  Chest: normal effort Abdomen: Soft, NT, ND Skin: dry, intact Extremities: ankle edema, no pedal edema. Is wearing tight/form fitting shoes/slippers Musculoskeletal: mild antalgia with wb on left more than right. Uses walker for balance. Otherwise, Coordination normal.  motor generally 5/5 except for pain inhibition. Continued, mild stocking glove sensory loss in the feet which has unchanged.    Skin: Skin is warm and dry.  Psychiatric: pleasant and appropriate  Assessment/Plan:  1. Left Lumbar 3 radiculopathy/stenosis s/p L3-4 laminectomy and fusion. With residual radiculopathy in the left lower extremity perhaps involving L3 and L4 2. Diabetes with peripheral neuropathy. 3. Heterotopic bone--left hip    Plan:  1. Needs to increase her physical activity once again.  I  filled out a prescription for her to have access to the [facility] for use of equipment and the pool.  She needs to be more active at home in general as well.  Wrote her a prescription for a new rolling walker 2. Oxycodone for breakthrough pain 10mg  #60 refilled today.  We will continue the controlled substance monitoring program, this consists of regular clinic visits, examinations, routine drug screening, pill counts as well as use of [place] Controlled Substance Reporting System. [place] today.   -Drug swab today 3.    Neuropathic pain seems better in general.  Hold off on any new medication today 5. Ibuprofen PRN with food  on a limited basis.  6. Lidoderm patch to left hip/leg PRN, voltaren gel.  7.  Distal swelling in ankles likely positional and perhaps related to her osteoarthritis.  No signs of generalized fluid overload.  Her feet may not be as involved as the ankles because of her formfitting shoes that she wears daily.  May use compressive sleeves or stockings to help with edema if she chooses.   Follow up with me in4 months.  Fifteen minutes of face to face patient care time were spent during this visit. All questions were encouraged and answered.  Greater than 50% of time during this encounter was spent counseling patient/family in regard to edema mgt, HEP.

## 2018-07-13 LAB — DRUG TOX MONITOR 1 W/CONF, ORAL FLD
Amphetamines: NEGATIVE ng/mL (ref <10)
BENZODIAZEPINES: NEGATIVE ng/mL (ref <0.50)
Barbiturates: NEGATIVE ng/mL (ref <10)
Buprenorphine: NEGATIVE ng/mL (ref <0.10)
Cocaine: NEGATIVE ng/mL (ref <5.0)
Codeine: NEGATIVE ng/mL (ref <2.5)
Dihydrocodeine: NEGATIVE ng/mL (ref <2.5)
Fentanyl: NEGATIVE ng/mL (ref <0.10)
HEROIN METABOLITE: NEGATIVE ng/mL (ref <1.0)
HYDROCODONE: NEGATIVE ng/mL (ref <2.5)
HYDROMORPHONE: NEGATIVE ng/mL (ref <2.5)
MARIJUANA: NEGATIVE ng/mL (ref <2.5)
MDMA: NEGATIVE ng/mL (ref <10)
MEPROBAMATE: NEGATIVE ng/mL (ref <2.5)
Methadone: NEGATIVE ng/mL (ref <5.0)
Morphine: NEGATIVE ng/mL (ref <2.5)
NOROXYCODONE: NEGATIVE ng/mL (ref <2.5)
Nicotine Metabolite: NEGATIVE ng/mL (ref <5.0)
Norhydrocodone: NEGATIVE ng/mL (ref <2.5)
OPIATES: POSITIVE ng/mL — AB (ref <2.5)
Oxycodone: 27.5 ng/mL — ABNORMAL HIGH (ref <2.5)
Oxymorphone: NEGATIVE ng/mL (ref <2.5)
PHENCYCLIDINE: NEGATIVE ng/mL (ref <10)
TAPENTADOL: NEGATIVE ng/mL (ref <5.0)
Tramadol: NEGATIVE ng/mL (ref <5.0)
Zolpidem: NEGATIVE ng/mL (ref <5.0)

## 2018-07-13 LAB — DRUG TOX ALC METAB W/CON, ORAL FLD: Alcohol Metabolite: NEGATIVE ng/mL (ref <25)

## 2018-07-15 ENCOUNTER — Telehealth: Payer: Self-pay | Admitting: *Deleted

## 2018-07-15 NOTE — Telephone Encounter (Signed)
Oral swab drug screen was consistent for prescribed medications.  ?

## 2018-08-13 DIAGNOSIS — I10 Essential (primary) hypertension: Secondary | ICD-10-CM | POA: Diagnosis not present

## 2018-08-13 DIAGNOSIS — M48062 Spinal stenosis, lumbar region with neurogenic claudication: Secondary | ICD-10-CM | POA: Diagnosis not present

## 2018-08-13 DIAGNOSIS — E782 Mixed hyperlipidemia: Secondary | ICD-10-CM | POA: Diagnosis not present

## 2018-08-13 DIAGNOSIS — D500 Iron deficiency anemia secondary to blood loss (chronic): Secondary | ICD-10-CM | POA: Diagnosis not present

## 2018-08-13 DIAGNOSIS — N182 Chronic kidney disease, stage 2 (mild): Secondary | ICD-10-CM | POA: Diagnosis not present

## 2018-08-13 DIAGNOSIS — I5032 Chronic diastolic (congestive) heart failure: Secondary | ICD-10-CM | POA: Diagnosis not present

## 2018-08-13 DIAGNOSIS — E1122 Type 2 diabetes mellitus with diabetic chronic kidney disease: Secondary | ICD-10-CM | POA: Diagnosis not present

## 2018-08-13 DIAGNOSIS — E039 Hypothyroidism, unspecified: Secondary | ICD-10-CM | POA: Diagnosis not present

## 2018-11-11 ENCOUNTER — Encounter: Payer: Medicare HMO | Admitting: Physical Medicine & Rehabilitation

## 2018-11-13 DIAGNOSIS — D500 Iron deficiency anemia secondary to blood loss (chronic): Secondary | ICD-10-CM | POA: Diagnosis not present

## 2018-11-13 DIAGNOSIS — E039 Hypothyroidism, unspecified: Secondary | ICD-10-CM | POA: Diagnosis not present

## 2018-11-13 DIAGNOSIS — I10 Essential (primary) hypertension: Secondary | ICD-10-CM | POA: Diagnosis not present

## 2018-11-13 DIAGNOSIS — N182 Chronic kidney disease, stage 2 (mild): Secondary | ICD-10-CM | POA: Diagnosis not present

## 2018-11-13 DIAGNOSIS — E1122 Type 2 diabetes mellitus with diabetic chronic kidney disease: Secondary | ICD-10-CM | POA: Diagnosis not present

## 2018-11-13 DIAGNOSIS — I5032 Chronic diastolic (congestive) heart failure: Secondary | ICD-10-CM | POA: Diagnosis not present

## 2018-11-13 DIAGNOSIS — E782 Mixed hyperlipidemia: Secondary | ICD-10-CM | POA: Diagnosis not present

## 2018-11-18 DIAGNOSIS — L89311 Pressure ulcer of right buttock, stage 1: Secondary | ICD-10-CM | POA: Diagnosis not present

## 2018-11-18 DIAGNOSIS — I10 Essential (primary) hypertension: Secondary | ICD-10-CM | POA: Diagnosis not present

## 2018-11-18 DIAGNOSIS — D500 Iron deficiency anemia secondary to blood loss (chronic): Secondary | ICD-10-CM | POA: Diagnosis not present

## 2018-11-18 DIAGNOSIS — Z23 Encounter for immunization: Secondary | ICD-10-CM | POA: Diagnosis not present

## 2018-11-18 DIAGNOSIS — Z6829 Body mass index (BMI) 29.0-29.9, adult: Secondary | ICD-10-CM | POA: Diagnosis not present

## 2018-11-18 DIAGNOSIS — N182 Chronic kidney disease, stage 2 (mild): Secondary | ICD-10-CM | POA: Diagnosis not present

## 2018-11-18 DIAGNOSIS — I5032 Chronic diastolic (congestive) heart failure: Secondary | ICD-10-CM | POA: Diagnosis not present

## 2018-11-18 DIAGNOSIS — E1122 Type 2 diabetes mellitus with diabetic chronic kidney disease: Secondary | ICD-10-CM | POA: Diagnosis not present

## 2018-11-25 DIAGNOSIS — I10 Essential (primary) hypertension: Secondary | ICD-10-CM | POA: Diagnosis not present

## 2018-11-25 DIAGNOSIS — L89312 Pressure ulcer of right buttock, stage 2: Secondary | ICD-10-CM | POA: Diagnosis not present

## 2018-11-25 DIAGNOSIS — L89311 Pressure ulcer of right buttock, stage 1: Secondary | ICD-10-CM | POA: Diagnosis not present

## 2018-11-25 DIAGNOSIS — L89322 Pressure ulcer of left buttock, stage 2: Secondary | ICD-10-CM | POA: Diagnosis not present

## 2018-11-25 DIAGNOSIS — E119 Type 2 diabetes mellitus without complications: Secondary | ICD-10-CM | POA: Diagnosis not present

## 2018-11-25 DIAGNOSIS — R69 Illness, unspecified: Secondary | ICD-10-CM | POA: Diagnosis not present

## 2018-11-25 DIAGNOSIS — Z794 Long term (current) use of insulin: Secondary | ICD-10-CM | POA: Diagnosis not present

## 2018-11-29 DIAGNOSIS — I10 Essential (primary) hypertension: Secondary | ICD-10-CM | POA: Diagnosis not present

## 2018-11-29 DIAGNOSIS — L89311 Pressure ulcer of right buttock, stage 1: Secondary | ICD-10-CM | POA: Diagnosis not present

## 2018-11-29 DIAGNOSIS — L89322 Pressure ulcer of left buttock, stage 2: Secondary | ICD-10-CM | POA: Diagnosis not present

## 2018-11-29 DIAGNOSIS — E119 Type 2 diabetes mellitus without complications: Secondary | ICD-10-CM | POA: Diagnosis not present

## 2018-11-29 DIAGNOSIS — L89312 Pressure ulcer of right buttock, stage 2: Secondary | ICD-10-CM | POA: Diagnosis not present

## 2018-11-29 DIAGNOSIS — Z794 Long term (current) use of insulin: Secondary | ICD-10-CM | POA: Diagnosis not present

## 2018-11-29 DIAGNOSIS — R69 Illness, unspecified: Secondary | ICD-10-CM | POA: Diagnosis not present

## 2018-12-03 DIAGNOSIS — L89311 Pressure ulcer of right buttock, stage 1: Secondary | ICD-10-CM | POA: Diagnosis not present

## 2018-12-03 DIAGNOSIS — Z794 Long term (current) use of insulin: Secondary | ICD-10-CM | POA: Diagnosis not present

## 2018-12-03 DIAGNOSIS — L89312 Pressure ulcer of right buttock, stage 2: Secondary | ICD-10-CM | POA: Diagnosis not present

## 2018-12-03 DIAGNOSIS — E119 Type 2 diabetes mellitus without complications: Secondary | ICD-10-CM | POA: Diagnosis not present

## 2018-12-03 DIAGNOSIS — R69 Illness, unspecified: Secondary | ICD-10-CM | POA: Diagnosis not present

## 2018-12-03 DIAGNOSIS — I10 Essential (primary) hypertension: Secondary | ICD-10-CM | POA: Diagnosis not present

## 2018-12-03 DIAGNOSIS — L89322 Pressure ulcer of left buttock, stage 2: Secondary | ICD-10-CM | POA: Diagnosis not present

## 2018-12-06 DIAGNOSIS — L89311 Pressure ulcer of right buttock, stage 1: Secondary | ICD-10-CM | POA: Diagnosis not present

## 2018-12-06 DIAGNOSIS — R69 Illness, unspecified: Secondary | ICD-10-CM | POA: Diagnosis not present

## 2018-12-06 DIAGNOSIS — L89322 Pressure ulcer of left buttock, stage 2: Secondary | ICD-10-CM | POA: Diagnosis not present

## 2018-12-06 DIAGNOSIS — L89312 Pressure ulcer of right buttock, stage 2: Secondary | ICD-10-CM | POA: Diagnosis not present

## 2018-12-06 DIAGNOSIS — E119 Type 2 diabetes mellitus without complications: Secondary | ICD-10-CM | POA: Diagnosis not present

## 2018-12-06 DIAGNOSIS — I10 Essential (primary) hypertension: Secondary | ICD-10-CM | POA: Diagnosis not present

## 2018-12-06 DIAGNOSIS — Z794 Long term (current) use of insulin: Secondary | ICD-10-CM | POA: Diagnosis not present

## 2018-12-09 DIAGNOSIS — Z794 Long term (current) use of insulin: Secondary | ICD-10-CM | POA: Diagnosis not present

## 2018-12-09 DIAGNOSIS — L89312 Pressure ulcer of right buttock, stage 2: Secondary | ICD-10-CM | POA: Diagnosis not present

## 2018-12-09 DIAGNOSIS — L89311 Pressure ulcer of right buttock, stage 1: Secondary | ICD-10-CM | POA: Diagnosis not present

## 2018-12-09 DIAGNOSIS — L89322 Pressure ulcer of left buttock, stage 2: Secondary | ICD-10-CM | POA: Diagnosis not present

## 2018-12-09 DIAGNOSIS — R69 Illness, unspecified: Secondary | ICD-10-CM | POA: Diagnosis not present

## 2018-12-09 DIAGNOSIS — I10 Essential (primary) hypertension: Secondary | ICD-10-CM | POA: Diagnosis not present

## 2018-12-09 DIAGNOSIS — E119 Type 2 diabetes mellitus without complications: Secondary | ICD-10-CM | POA: Diagnosis not present

## 2018-12-11 ENCOUNTER — Encounter: Payer: Worker's Compensation | Admitting: Physical Medicine & Rehabilitation

## 2018-12-12 DIAGNOSIS — R69 Illness, unspecified: Secondary | ICD-10-CM | POA: Diagnosis not present

## 2018-12-12 DIAGNOSIS — L89322 Pressure ulcer of left buttock, stage 2: Secondary | ICD-10-CM | POA: Diagnosis not present

## 2018-12-12 DIAGNOSIS — I10 Essential (primary) hypertension: Secondary | ICD-10-CM | POA: Diagnosis not present

## 2018-12-12 DIAGNOSIS — E119 Type 2 diabetes mellitus without complications: Secondary | ICD-10-CM | POA: Diagnosis not present

## 2018-12-12 DIAGNOSIS — L89311 Pressure ulcer of right buttock, stage 1: Secondary | ICD-10-CM | POA: Diagnosis not present

## 2018-12-12 DIAGNOSIS — Z794 Long term (current) use of insulin: Secondary | ICD-10-CM | POA: Diagnosis not present

## 2018-12-12 DIAGNOSIS — L89312 Pressure ulcer of right buttock, stage 2: Secondary | ICD-10-CM | POA: Diagnosis not present

## 2018-12-19 DIAGNOSIS — Z794 Long term (current) use of insulin: Secondary | ICD-10-CM | POA: Diagnosis not present

## 2018-12-19 DIAGNOSIS — R69 Illness, unspecified: Secondary | ICD-10-CM | POA: Diagnosis not present

## 2018-12-19 DIAGNOSIS — E119 Type 2 diabetes mellitus without complications: Secondary | ICD-10-CM | POA: Diagnosis not present

## 2018-12-19 DIAGNOSIS — L89311 Pressure ulcer of right buttock, stage 1: Secondary | ICD-10-CM | POA: Diagnosis not present

## 2018-12-19 DIAGNOSIS — L89312 Pressure ulcer of right buttock, stage 2: Secondary | ICD-10-CM | POA: Diagnosis not present

## 2018-12-19 DIAGNOSIS — I10 Essential (primary) hypertension: Secondary | ICD-10-CM | POA: Diagnosis not present

## 2018-12-19 DIAGNOSIS — L89322 Pressure ulcer of left buttock, stage 2: Secondary | ICD-10-CM | POA: Diagnosis not present

## 2018-12-24 DIAGNOSIS — E119 Type 2 diabetes mellitus without complications: Secondary | ICD-10-CM | POA: Diagnosis not present

## 2018-12-24 DIAGNOSIS — I10 Essential (primary) hypertension: Secondary | ICD-10-CM | POA: Diagnosis not present

## 2018-12-24 DIAGNOSIS — L89311 Pressure ulcer of right buttock, stage 1: Secondary | ICD-10-CM | POA: Diagnosis not present

## 2018-12-24 DIAGNOSIS — L89312 Pressure ulcer of right buttock, stage 2: Secondary | ICD-10-CM | POA: Diagnosis not present

## 2018-12-24 DIAGNOSIS — R69 Illness, unspecified: Secondary | ICD-10-CM | POA: Diagnosis not present

## 2018-12-24 DIAGNOSIS — Z794 Long term (current) use of insulin: Secondary | ICD-10-CM | POA: Diagnosis not present

## 2018-12-24 DIAGNOSIS — L89322 Pressure ulcer of left buttock, stage 2: Secondary | ICD-10-CM | POA: Diagnosis not present

## 2019-01-08 ENCOUNTER — Other Ambulatory Visit: Payer: Self-pay

## 2019-01-08 ENCOUNTER — Encounter
Payer: Worker's Compensation | Attending: Physical Medicine & Rehabilitation | Admitting: Physical Medicine & Rehabilitation

## 2019-01-08 ENCOUNTER — Encounter: Payer: Self-pay | Admitting: Physical Medicine & Rehabilitation

## 2019-01-08 DIAGNOSIS — M898X9 Other specified disorders of bone, unspecified site: Secondary | ICD-10-CM | POA: Insufficient documentation

## 2019-01-08 DIAGNOSIS — G894 Chronic pain syndrome: Secondary | ICD-10-CM | POA: Insufficient documentation

## 2019-01-08 DIAGNOSIS — S72002S Fracture of unspecified part of neck of left femur, sequela: Secondary | ICD-10-CM | POA: Insufficient documentation

## 2019-01-08 MED ORDER — OXYCODONE HCL 10 MG PO TABS: 10.0000 mg | ORAL_TABLET | Freq: Three times a day (TID) | ORAL | 0 refills | Status: DC | PRN

## 2019-01-08 NOTE — Patient Instructions (Signed)
HEAT PRIOR TO ACTIVITY ON YOUR LEFT  ICE AFTER ACTIVITY OR FOR PAIN.

## 2019-01-08 NOTE — Progress Notes (Signed)
Subjective:    Patient ID: April [name], female    DOB: 10/28/1925, 85 y.o.   MRN: [id number]  HPI   [name] is here in follow-up of her chronic pain and gait disorder.  She states that her left hip and buttock has been bothering her more over the last few weeks.  She states that she has not had access to the [facility] for some time and her activity levels have decreased in general.  She still takes oxycodone for more severe pain.  She tries to stay as active as she can at home but is only walking for limited distances.  She uses a rolling walker for balance.  She has not fallen.  Her pain seems to be in the left buttock with radiation into the back.  Sometimes it will radiate down further into the upper thigh.  Pain is worse with activity and sometimes with prolonged sitting and standing.  Pain Inventory Average Pain 7 Pain Right Now 7 My pain is aching  In the last 24 hours, has pain interfered with the following? General activity 5 Relation with others 5 Enjoyment of life 5 What TIME of day is your pain at its worst? daytime Sleep (in general) Fair  Pain is worse with: walking and standing Pain improves with: medication Relief from Meds: 5  Mobility walk with assistance use a walker how many minutes can you walk? 15 ability to climb steps?  yes do you drive?  no  Function not employed: date last employed 03/2014 I need assistance with the following:  meal prep, household duties and shopping  Neuro/Psych bladder control problems  Prior Studies Any changes since last visit?  no  Physicians involved in your care Any changes since last visit?  no   No family history on file. Social History   Socioeconomic History  . Marital status: Single    Spouse name: Not on file  . Number of children: Not on file  . Years of education: Not on file  . Highest education level: Not on file  Occupational History  . Not on file  Social Needs  . Financial resource  strain: Not on file  . Food insecurity:    Worry: Not on file    Inability: Not on file  . Transportation needs:    Medical: Not on file    Non-medical: Not on file  Tobacco Use  . Smoking status: Never Smoker  . Smokeless tobacco: Never Used  Substance and Sexual Activity  . Alcohol use: No  . Drug use: No  . Sexual activity: Never    Birth control/protection: Post-menopausal  Lifestyle  . Physical activity:    Days per week: Not on file    Minutes per session: Not on file  . Stress: Not on file  Relationships  . Social connections:    Talks on phone: Not on file    Gets together: Not on file    Attends religious service: Not on file    Active member of club or organization: Not on file    Attends meetings of clubs or organizations: Not on file    Relationship status: Not on file  Other Topics Concern  . Not on file  Social History Narrative  . Not on file   Past Surgical History:  Procedure Laterality Date  . ADENOIDECTOMY    . CHOLECYSTECTOMY    . EYE SURGERY    . HIP ARTHROPLASTY Left 04/08/2014   Procedure: LEFT HIP HEMIARTHROPLASTY;  Surgeon: Verlee Rossetti, MD;  Location: [facility] OR;  Service: Orthopedics;  Laterality: Left;  . left cataract removed    . TONSILLECTOMY     Past Medical History:  Diagnosis Date  . Cataract    left eye  . Chronic back pain    stenosis  . Constipation    takes an OTC stool softener   . Diabetes mellitus without complication (HCC)    takes Lantus daily  . GERD (gastroesophageal reflux disease)    not on Protonix daily  . History of bronchitis 20 yrs ago  . History of lupus (HCC) 77yrs ago  . History of shingles   . HTN (hypertension) 04/07/2014  . Hyperlipidemia 04/07/2014   takes Lovastatin daily  . Hypertension    takes Amlodipine and Atenolol daily  . Hypothyroidism 04/07/2014   takes Synthroid daily  . Joint pain   . Nocturia   . OA (osteoarthritis) 04/07/2014  . Peripheral edema   . Urinary frequency    takes  Vesicare daily  . UTI (lower urinary tract infection)    completed antibioitcs on 07/28/14   BP (!) 164/83   Pulse 61   Ht 5\' 4"  (1.626 m)   Wt 174 lb (78.9 kg)   SpO2 92%   BMI 29.87 kg/m   Opioid Risk Score:   Fall Risk Score:  `1  Depression screen PHQ 2/9  Depression screen [facility] 2/9 07/10/2018 11/19/2017 05/30/2016 03/06/2016 01/06/2016 08/18/2015 07/16/2015  Decreased Interest 1 0 0 1 0 0 0  Down, Depressed, Hopeless 1 0 0 0 0 0 0  PHQ - 2 Score 2 0 0 1 0 0 0  Altered sleeping - - - - - - -  Tired, decreased energy - - - - - - -  Change in appetite - - - - - - -  Feeling bad or failure about yourself  - - - - - - -  Trouble concentrating - - - - - - -  Moving slowly or fidgety/restless - - - - - - -  Suicidal thoughts - - - - - - -  PHQ-9 Score - - - - - - -    Review of Systems  Constitutional: Negative.   HENT: Negative.   Eyes: Negative.   Respiratory: Negative.   Cardiovascular: Negative.   Gastrointestinal: Negative.   Endocrine: Negative.   Genitourinary: Negative.   Musculoskeletal: Negative.   Skin: Negative.   Allergic/Immunologic: Negative.   Neurological: Negative.   Hematological: Negative.   Psychiatric/Behavioral: Negative.   All other systems reviewed and are negative.      Objective:   Physical Exam  General: No acute distress HEENT: EOMI, oral membranes moist Cards: reg rate  Chest: normal effort Abdomen: Soft, NT, ND Skin: dry, intact Extremities: no edema   Musculoskeletal:Pain with palpation over the left greater trochanter and heterotopic bone.  With palpation pain radiates into the upper buttock and even low back.  She has generalized tenderness with lumbar range of motion still.  Otherwise, Coordination normal.  motor generally 5/5 except for pain inhibition. Continued, mild stocking glove sensory loss in the feet which has unchanged.  Skin: Skin is warm and dry.  Psychiatric: Pleasant and appropriate  Assessment/Plan:  1.  Left Lumbar 3 radiculopathy/stenosis s/p L3-4 laminectomy and fusion.With residual radiculopathy in the left lower extremity perhaps involving L3 and L4 2. Diabetes with peripheral neuropathy. 3. Heterotopic bone--left hip. This appears to be most of the cause of her increased left  buttock pain    Plan:  1.Discussed the fact that her activity is key to maintaining function. The [employer] membership has been vital for promoting mobility, strength and better balance, ROM. 2. Oxycodone for breakthrough pain 10mg  #60 this was refilled today -We will continue the controlled substance monitoring program, this consists of regular clinic visits, examinations, routine drug screening, pill counts as well as use of [place] Controlled Substance Reporting System. [place] today.     3.  Neuropathic pain seems better in general.  Hold off on any new medication today 5. Ibuprofen PRN with food on a limited basis.  6. Lidoderm patch to left hip/leg PRN, voltaren gel. ice for pain relief and heat to stretch prior to activities.     Follow up with me in4 months.  Fifteen minutes of face to face patient care time were spent during this visit. All questions were encouraged and answered.

## 2019-01-16 ENCOUNTER — Telehealth: Payer: Self-pay

## 2019-01-16 DIAGNOSIS — M898X9 Other specified disorders of bone, unspecified site: Secondary | ICD-10-CM

## 2019-01-16 DIAGNOSIS — S72002S Fracture of unspecified part of neck of left femur, sequela: Secondary | ICD-10-CM

## 2019-01-16 DIAGNOSIS — G894 Chronic pain syndrome: Secondary | ICD-10-CM

## 2019-01-16 NOTE — Telephone Encounter (Signed)
When pts daughter went to pick up the Oxycodone last week it needed a PA. The PA was sent to the [facility]. Today the daughter calls stating they will only authorize a 5 day supply at time until going to court on 02/04/2019.  [facility]

## 2019-01-17 MED ORDER — OXYCODONE HCL 10 MG PO TABS: 10.0000 mg | ORAL_TABLET | Freq: Three times a day (TID) | ORAL | 0 refills | Status: DC | PRN

## 2019-01-17 NOTE — Telephone Encounter (Signed)
April [name] can you send Oxycodone for pt. 5 day supply. Explained in other documentation on this telephone note.

## 2019-01-17 NOTE — Telephone Encounter (Signed)
April [name] will need to help with this.

## 2019-01-17 NOTE — Telephone Encounter (Signed)
Placed a call to [facility], spoke with Kennyth Arnold the pharmacist. She states Workman's Compensation only allow a week prescription.  New prescription will be e-scribe, she filled her Oxycodone on 01/15/2019. New prescription will be filled on 01/20/2019. PMP was reviewed, no documentation was noted.

## 2019-01-27 ENCOUNTER — Telehealth: Payer: Self-pay

## 2019-01-27 DIAGNOSIS — M898X9 Other specified disorders of bone, unspecified site: Secondary | ICD-10-CM

## 2019-01-27 DIAGNOSIS — G894 Chronic pain syndrome: Secondary | ICD-10-CM

## 2019-01-27 DIAGNOSIS — S72002S Fracture of unspecified part of neck of left femur, sequela: Secondary | ICD-10-CM

## 2019-01-27 MED ORDER — OXYCODONE HCL 10 MG PO TABS: 10.0000 mg | ORAL_TABLET | Freq: Three times a day (TID) | ORAL | 0 refills | Status: DC | PRN

## 2019-01-27 NOTE — Telephone Encounter (Signed)
Can someone help with that letter? Thx. rx written.

## 2019-01-27 NOTE — Telephone Encounter (Signed)
Need refill on Oxycodone. Can send monthly supply now. Last filled #15 on 01/23/2019, next appt 05/12/2019. Also need letter stating patient needs help with household duties, meal prep, getting to appointments dated from when had accident 04/08/2014 to indefinitely. Fax to Cristy Friedlander [phone].

## 2019-01-29 NOTE — Telephone Encounter (Signed)
Letter written, signed and faxed to Cristy Friedlander [phone].

## 2019-02-13 DIAGNOSIS — N182 Chronic kidney disease, stage 2 (mild): Secondary | ICD-10-CM | POA: Diagnosis not present

## 2019-02-13 DIAGNOSIS — E1122 Type 2 diabetes mellitus with diabetic chronic kidney disease: Secondary | ICD-10-CM | POA: Diagnosis not present

## 2019-02-13 DIAGNOSIS — I824Z3 Acute embolism and thrombosis of unspecified deep veins of distal lower extremity, bilateral: Secondary | ICD-10-CM | POA: Diagnosis not present

## 2019-02-13 DIAGNOSIS — I10 Essential (primary) hypertension: Secondary | ICD-10-CM | POA: Diagnosis not present

## 2019-02-17 DIAGNOSIS — I5032 Chronic diastolic (congestive) heart failure: Secondary | ICD-10-CM | POA: Diagnosis not present

## 2019-02-17 DIAGNOSIS — D500 Iron deficiency anemia secondary to blood loss (chronic): Secondary | ICD-10-CM | POA: Diagnosis not present

## 2019-02-17 DIAGNOSIS — E782 Mixed hyperlipidemia: Secondary | ICD-10-CM | POA: Diagnosis not present

## 2019-02-17 DIAGNOSIS — M48062 Spinal stenosis, lumbar region with neurogenic claudication: Secondary | ICD-10-CM | POA: Diagnosis not present

## 2019-02-17 DIAGNOSIS — Z6829 Body mass index (BMI) 29.0-29.9, adult: Secondary | ICD-10-CM | POA: Diagnosis not present

## 2019-02-17 DIAGNOSIS — N182 Chronic kidney disease, stage 2 (mild): Secondary | ICD-10-CM | POA: Diagnosis not present

## 2019-02-17 DIAGNOSIS — I10 Essential (primary) hypertension: Secondary | ICD-10-CM | POA: Diagnosis not present

## 2019-02-17 DIAGNOSIS — E1122 Type 2 diabetes mellitus with diabetic chronic kidney disease: Secondary | ICD-10-CM | POA: Diagnosis not present

## 2019-03-12 ENCOUNTER — Encounter: Payer: Worker's Compensation | Admitting: Physical Medicine & Rehabilitation

## 2019-05-12 ENCOUNTER — Encounter: Payer: Medicare HMO | Admitting: Physical Medicine & Rehabilitation

## 2019-05-14 DIAGNOSIS — E782 Mixed hyperlipidemia: Secondary | ICD-10-CM | POA: Diagnosis not present

## 2019-05-14 DIAGNOSIS — D500 Iron deficiency anemia secondary to blood loss (chronic): Secondary | ICD-10-CM | POA: Diagnosis not present

## 2019-05-14 DIAGNOSIS — E039 Hypothyroidism, unspecified: Secondary | ICD-10-CM | POA: Diagnosis not present

## 2019-05-14 DIAGNOSIS — E1122 Type 2 diabetes mellitus with diabetic chronic kidney disease: Secondary | ICD-10-CM | POA: Diagnosis not present

## 2019-05-14 DIAGNOSIS — I10 Essential (primary) hypertension: Secondary | ICD-10-CM | POA: Diagnosis not present

## 2019-05-14 DIAGNOSIS — N182 Chronic kidney disease, stage 2 (mild): Secondary | ICD-10-CM | POA: Diagnosis not present

## 2019-05-21 DIAGNOSIS — D500 Iron deficiency anemia secondary to blood loss (chronic): Secondary | ICD-10-CM | POA: Diagnosis not present

## 2019-05-21 DIAGNOSIS — Z6829 Body mass index (BMI) 29.0-29.9, adult: Secondary | ICD-10-CM | POA: Diagnosis not present

## 2019-05-21 DIAGNOSIS — M810 Age-related osteoporosis without current pathological fracture: Secondary | ICD-10-CM | POA: Diagnosis not present

## 2019-05-21 DIAGNOSIS — E1122 Type 2 diabetes mellitus with diabetic chronic kidney disease: Secondary | ICD-10-CM | POA: Diagnosis not present

## 2019-05-21 DIAGNOSIS — I5032 Chronic diastolic (congestive) heart failure: Secondary | ICD-10-CM | POA: Diagnosis not present

## 2019-05-21 DIAGNOSIS — L89311 Pressure ulcer of right buttock, stage 1: Secondary | ICD-10-CM | POA: Diagnosis not present

## 2019-05-21 DIAGNOSIS — N182 Chronic kidney disease, stage 2 (mild): Secondary | ICD-10-CM | POA: Diagnosis not present

## 2019-05-21 DIAGNOSIS — L89321 Pressure ulcer of left buttock, stage 1: Secondary | ICD-10-CM | POA: Diagnosis not present

## 2019-05-21 DIAGNOSIS — I10 Essential (primary) hypertension: Secondary | ICD-10-CM | POA: Diagnosis not present

## 2019-05-21 DIAGNOSIS — M48062 Spinal stenosis, lumbar region with neurogenic claudication: Secondary | ICD-10-CM | POA: Diagnosis not present

## 2019-05-21 DIAGNOSIS — E782 Mixed hyperlipidemia: Secondary | ICD-10-CM | POA: Diagnosis not present

## 2019-05-21 DIAGNOSIS — M5416 Radiculopathy, lumbar region: Secondary | ICD-10-CM | POA: Diagnosis not present

## 2019-05-21 DIAGNOSIS — N3946 Mixed incontinence: Secondary | ICD-10-CM | POA: Diagnosis not present

## 2019-06-18 ENCOUNTER — Encounter: Payer: Self-pay | Admitting: Physical Medicine & Rehabilitation

## 2019-06-18 ENCOUNTER — Other Ambulatory Visit: Payer: Self-pay

## 2019-06-18 ENCOUNTER — Encounter
Payer: Worker's Compensation | Attending: Physical Medicine & Rehabilitation | Admitting: Physical Medicine & Rehabilitation

## 2019-06-18 VITALS — BP 162/75 | HR 68 | Temp 98.5°F | Ht 68.5 in

## 2019-06-18 DIAGNOSIS — M898X9 Other specified disorders of bone, unspecified site: Secondary | ICD-10-CM | POA: Insufficient documentation

## 2019-06-18 DIAGNOSIS — M5416 Radiculopathy, lumbar region: Secondary | ICD-10-CM | POA: Diagnosis not present

## 2019-06-18 DIAGNOSIS — G894 Chronic pain syndrome: Secondary | ICD-10-CM | POA: Diagnosis not present

## 2019-06-18 DIAGNOSIS — S72002S Fracture of unspecified part of neck of left femur, sequela: Secondary | ICD-10-CM | POA: Insufficient documentation

## 2019-06-18 MED ORDER — OXYCODONE HCL 10 MG PO TABS: 10.0000 mg | ORAL_TABLET | Freq: Three times a day (TID) | ORAL | 0 refills | Status: DC | PRN

## 2019-06-18 NOTE — Patient Instructions (Signed)
PLEASE FEEL FREE TO CALL OUR OFFICE WITH ANY PROBLEMS OR QUESTIONS ([phone])      

## 2019-06-18 NOTE — Progress Notes (Signed)
Subjective:    Patient ID: April [name], female    DOB: 09/03/1925, 85 y.o.   MRN: [id number]  HPI   [name] is here in follow up of her chronic pain and gait disorder. She continues to have pain in her left low back as well as the hip/leg. She maintains her activity as much as she can at home givid the covid crisis. [facility] but [facility] has been [facility].   She has been off her oxycodone for a while d/t some apparent confusion at the pharmacy and our office. Since being off her pain levels have been higher, but she tries to work through it. She was using oxycodone 10mg  typically daily prn.   She is on a RW. She asked me if it needed to be replaced as it sometimes was a little shaky.      Pain Inventory Average Pain 5 Pain Right Now 4 My pain is sharp  In the last 24 hours, has pain interfered with the following? General activity 0 Relation with others 0 Enjoyment of life 0 What TIME of day is your pain at its worst? night Sleep (in general) Fair  Pain is worse with: walking and bending Pain improves with: rest, heat/ice and medication Relief from Meds: 7  Mobility walk with assistance use a walker do you drive?  no  Function disabled: date disabled .  Neuro/Psych bladder control problems trouble walking  Prior Studies Any changes since last visit?  no  Physicians involved in your care Any changes since last visit?  no   No family history on file. Social History   Socioeconomic History  . Marital status: Single    Spouse name: Not on file  . Number of children: Not on file  . Years of education: Not on file  . Highest education level: Not on file  Occupational History  . Not on file  Social Needs  . Financial resource strain: Not on file  . Food insecurity    Worry: Not on file    Inability: Not on file  . Transportation needs    Medical: Not on file    Non-medical: Not on file  Tobacco Use  . Smoking  status: Never Smoker  . Smokeless tobacco: Never Used  Substance and Sexual Activity  . Alcohol use: No  . Drug use: No  . Sexual activity: Never    Birth control/protection: Post-menopausal  Lifestyle  . Physical activity    Days per week: Not on file    Minutes per session: Not on file  . Stress: Not on file  Relationships  . Social Musicianconnections    Talks on phone: Not on file    Gets together: Not on file    Attends religious service: Not on file    Active member of club or organization: Not on file    Attends meetings of clubs or organizations: Not on file    Relationship status: Not on file  Other Topics Concern  . Not on file  Social History Narrative  . Not on file   Past Surgical History:  Procedure Laterality Date  . ADENOIDECTOMY    . CHOLECYSTECTOMY    . EYE SURGERY    . HIP ARTHROPLASTY Left 04/08/2014   Procedure: LEFT HIP HEMIARTHROPLASTY;  Surgeon: Verlee RossettiSteven R Norris, MD;  [facility];  Service: Orthopedics;  Laterality: Left;  . left cataract removed    . TONSILLECTOMY  Past Medical History:  Diagnosis Date  . Cataract    left eye  . Chronic back pain    stenosis  . Constipation    takes an OTC stool softener   . Diabetes mellitus without complication (HCC)    takes Lantus daily  . GERD (gastroesophageal reflux disease)    not on Protonix daily  . History of bronchitis 20 yrs ago  . History of lupus (HCC) 8544yrs ago  . History of shingles   . HTN (hypertension) 04/07/2014  . Hyperlipidemia 04/07/2014   takes Lovastatin daily  . Hypertension    takes Amlodipine and Atenolol daily  . Hypothyroidism 04/07/2014   takes Synthroid daily  . Joint pain   . Nocturia   . OA (osteoarthritis) 04/07/2014  . Peripheral edema   . Urinary frequency    takes Vesicare daily  . UTI (lower urinary tract infection)    completed antibioitcs on 07/28/14   BP (!) 162/75   Pulse 68   Temp 98.5 F (36.9 C)   Ht 5' 8.5" (1.74 m)   SpO2 95%   BMI 26.07 kg/m    Opioid Risk Score:   Fall Risk Score:  `1  Depression screen PHQ 2/9  [facility] 07/10/2018 11/19/2017 05/30/2016 03/06/2016 01/06/2016 08/18/2015 07/16/2015  Decreased Interest 1 0 0 1 0 0 0  Down, Depressed, Hopeless 1 0 0 0 0 0 0  PHQ - 2 Score 2 0 0 1 0 0 0  Altered sleeping - - - - - - -  Tired, decreased energy - - - - - - -  Change in appetite - - - - - - -  Feeling bad or failure about yourself  - - - - - - -  Trouble concentrating - - - - - - -  Moving slowly or fidgety/restless - - - - - - -  Suicidal thoughts - - - - - - -  PHQ-9 Score - - - - - - -     Review of Systems  Constitutional: Negative.   HENT: Negative.   Eyes: Negative.   Respiratory: Negative.   Cardiovascular: Negative.   Gastrointestinal: Negative.   Endocrine: Negative.   Genitourinary: Positive for difficulty urinating.  Musculoskeletal: Positive for arthralgias, back pain, gait problem and myalgias.  Skin: Negative.   Allergic/Immunologic: Negative.   Hematological: Negative.   Psychiatric/Behavioral: Negative.   All other systems reviewed and are negative.      Objective:   Physical Exam General: No acute distress HEENT: EOMI, oral membranes moist Cards: reg rate  Chest: normal effort Abdomen: Soft, NT, ND Skin: dry, intact Extremities: no edema Musculoskeletal:Pain with palpation over the left greater trochanter and heterotopic bone.  With palpation pain radiates into the upper buttock and even low back.  She has generalized tenderness with lumbar range of motion still.  Otherwise,Coordination normal.  motor generally 5/5 except for pain inhibition. Persistent stocking glove sensory loss in the feet which has unchanged.gait with RW deliberate but good stride length and width.  Skin: Skin is warm and dry.  Psychiatric: Pleasant and appropriate as always.   Assessment/Plan:  1. Left Lumbar 3 radiculopathy/stenosis s/p L3-4 laminectomy and fusion.Withresidual  radiculopathy in the left lower extremity perhaps involving L3 and L4 2. Diabetes with peripheral neuropathy. 3. Heterotopic bone--left hip. This appears to be most of the cause of her increased left buttock pain    Plan:  1.I want her to maintain her activity to tolerance. She  may participate in pool therapies [facility] if the facility has a safety protocol in place as she's high-risk with COVID. 2. Oxycodone for breakthrough pain 10mg  q12 prn #60--RF today. We will continue the controlled substance monitoring program, this consists of regular clinic visits, examinations, routine drug screening, pill counts as well as use of [place] Controlled Substance Reporting System. [place]CC[place] was reviewed today.     3.Neuropathic under fair control at present without specific medication.  5. Ibuprofen PRN with food on a limited basis.  6. Lidoderm patch to left hip/leg PRN, voltaren gel.ice for pain relief prn to hip,back, feet   Follow up with me in4 months.15 minutes of face to face patient care time were spent during this visit. All questions were encouraged and answered.

## 2019-10-15 ENCOUNTER — Other Ambulatory Visit: Payer: Self-pay

## 2019-10-15 ENCOUNTER — Encounter: Payer: Self-pay | Admitting: Physical Medicine & Rehabilitation

## 2019-10-15 ENCOUNTER — Encounter
Payer: Worker's Compensation | Attending: Physical Medicine & Rehabilitation | Admitting: Physical Medicine & Rehabilitation

## 2019-10-15 VITALS — BP 151/71 | HR 67 | Temp 97.7°F | Ht 68.0 in | Wt 179.4 lb

## 2019-10-15 DIAGNOSIS — N390 Urinary tract infection, site not specified: Secondary | ICD-10-CM | POA: Diagnosis present

## 2019-10-15 DIAGNOSIS — M898X9 Other specified disorders of bone, unspecified site: Secondary | ICD-10-CM | POA: Diagnosis present

## 2019-10-15 DIAGNOSIS — S72002S Fracture of unspecified part of neck of left femur, sequela: Secondary | ICD-10-CM | POA: Diagnosis not present

## 2019-10-15 DIAGNOSIS — E1142 Type 2 diabetes mellitus with diabetic polyneuropathy: Secondary | ICD-10-CM | POA: Diagnosis not present

## 2019-10-15 DIAGNOSIS — A499 Bacterial infection, unspecified: Secondary | ICD-10-CM | POA: Diagnosis present

## 2019-10-15 DIAGNOSIS — M5416 Radiculopathy, lumbar region: Secondary | ICD-10-CM

## 2019-10-15 DIAGNOSIS — G894 Chronic pain syndrome: Secondary | ICD-10-CM

## 2019-10-15 LAB — URINALYSIS
Bilirubin, UA: NEGATIVE
Glucose, UA: NEGATIVE
Ketones, UA: NEGATIVE
Leukocytes,UA: NEGATIVE
Nitrite, UA: POSITIVE — AB
Specific Gravity, UA: 1.017 (ref 1.005–1.030)
Urobilinogen, Ur: 1 mg/dL (ref 0.2–1.0)
pH, UA: 5.5 (ref 5.0–7.5)

## 2019-10-15 MED ORDER — OXYCODONE HCL 10 MG PO TABS: 10.0000 mg | ORAL_TABLET | Freq: Three times a day (TID) | ORAL | 0 refills | Status: DC | PRN

## 2019-10-15 NOTE — Patient Instructions (Signed)
PLEASE FEEL FREE TO CALL OUR OFFICE WITH ANY PROBLEMS OR QUESTIONS ([phone])      

## 2019-10-15 NOTE — Progress Notes (Signed)
Subjective:    Patient ID: April [name], female    DOB: 03/18/1925, 85 y.o.   MRN: [id number]  HPI   April [name] is here in follow-up of her chronic pain syndrome and gait disorder.  I saw her this past spring.  She states that she has been essentially maintaining.  She uses her oxycodone once or twice a week.  She uses ibuprofen more for daily pain with food.  She rarely uses Tylenol.  Pain is mostly in her low back as well as her left hip.  She uses a walker for balance.  She denies any falls or recent mishaps.  [facility] and limited access.  She does not like the fact that she is unable to maintain her strength and stamina as much as she once did.  She reports urinary frequency and some dysuria.  She has had problems with urinary tract infections in the past.  Pain Inventory Average Pain 5 Pain Right Now 5 My pain is constant, dull, tingling and aching  In the last 24 hours, has pain interfered with the following? General activity 0 Relation with others 0 Enjoyment of life 5 What TIME of day is your pain at its worst? varies Sleep (in general) Good  Pain is worse with: some activites Pain improves with: rest and medication Relief from Meds: 5  Mobility use a walker ability to climb steps?  yes do you drive?  no  Function I need assistance with the following:  meal prep, household duties and shopping  Neuro/Psych bowel control problems trouble walking  Prior Studies Any changes since last visit?  no  Physicians involved in your care Primary care Dr. Quintin AltoSteven Burdine   No family history on file. Social History   Socioeconomic History  . Marital status: Single    Spouse name: Not on file  . Number of children: Not on file  . Years of education: Not on file  . Highest education level: Not on file  Occupational History  . Not on file  Social Needs  . Financial resource strain: Not on file  . Food insecurity    Worry: Not on file    Inability: Not on file  . Transportation needs    Medical: Not on file    Non-medical: Not on file  Tobacco Use  . Smoking status: Never Smoker  . Smokeless tobacco: Never Used  Substance and Sexual Activity  . Alcohol use: No  . Drug use: No  . Sexual activity: Never    Birth control/protection: Post-menopausal  Lifestyle  . Physical activity    Days per week: Not on file    Minutes per session: Not on file  . Stress: Not on file  Relationships  . Social Musicianconnections    Talks on phone: Not on file    Gets together: Not on file    Attends religious service: Not on file    Active member of club or organization: Not on file    Attends meetings of clubs or organizations: Not on file    Relationship status: Not on file  Other Topics Concern  . Not on file  Social History Narrative  . Not on file   Past Surgical History:  Procedure Laterality Date  . ADENOIDECTOMY    . CHOLECYSTECTOMY    . EYE SURGERY    . HIP ARTHROPLASTY Left 04/08/2014   Procedure: LEFT HIP HEMIARTHROPLASTY;  Surgeon: Viviann SpareSteven  Russ Halo, MD;  Location: [facility] OR;  Service: Orthopedics;  Laterality: Left;  . left cataract removed    . TONSILLECTOMY     Past Medical History:  Diagnosis Date  . Cataract    left eye  . Chronic back pain    stenosis  . Constipation    takes an OTC stool softener   . Diabetes mellitus without complication (HCC)    takes Lantus daily  . GERD (gastroesophageal reflux disease)    not on Protonix daily  . History of bronchitis 20 yrs ago  . History of lupus (HCC) 23yrs ago  . History of shingles   . HTN (hypertension) 04/07/2014  . Hyperlipidemia 04/07/2014   takes Lovastatin daily  . Hypertension    takes Amlodipine and Atenolol daily  . Hypothyroidism 04/07/2014   takes Synthroid daily  . Joint pain   . Nocturia   . OA (osteoarthritis) 04/07/2014  . Peripheral edema   . Urinary frequency    takes Vesicare daily  . UTI (lower urinary tract  infection)    completed antibioitcs on 07/28/14   BP (!) 151/71   Pulse 67   Temp 97.7 F (36.5 C)   Ht 5\' 8"  (1.727 m)   Wt 179 lb 6.4 oz (81.4 kg)   SpO2 91%   BMI 27.28 kg/m   Opioid Risk Score:   Fall Risk Score:  `1  Depression screen PHQ 2/9  Depression screen [facility] 2/9 07/10/2018 11/19/2017 05/30/2016 03/06/2016 01/06/2016 08/18/2015 07/16/2015  Decreased Interest 1 0 0 1 0 0 0  Down, Depressed, Hopeless 1 0 0 0 0 0 0  PHQ - 2 Score 2 0 0 1 0 0 0  Altered sleeping - - - - - - -  Tired, decreased energy - - - - - - -  Change in appetite - - - - - - -  Feeling bad or failure about yourself  - - - - - - -  Trouble concentrating - - - - - - -  Moving slowly or fidgety/restless - - - - - - -  Suicidal thoughts - - - - - - -  PHQ-9 Score - - - - - - -    Review of Systems  Genitourinary: Positive for difficulty urinating.  Musculoskeletal: Positive for gait problem.  All other systems reviewed and are negative.      Objective:   Physical Exam  General: No acute distress HEENT: EOMI, oral membranes moist Cards: reg rate  Chest: normal effort Abdomen: Soft, NT, ND Skin: dry, intact Extremities: no edema   Musculoskeletal:left buttock pain and LB pain with palpation.  Neuro:motor generally 5/5 except for pain inhibition. Continued,mild stocking glove sensory loss in the feet which has unchanged. Skin: Skin is warm and dry.  Psychiatric: Pleasant and appropriate  Assessment/Plan:  1. Left Lumbar 3 radiculopathy/stenosis s/p L3-4 laminectomy and fusion.Withresidual radiculopathy in the left lower extremity perhaps involving L3 and L4 2. Diabetes with peripheral neuropathy. 3. Heterotopic bone--left hip. This appears to be most of the cause of her increased left buttock pain 4. ?UTI    Plan:  1.Maintain activity as possible. The [employer] membership is important for promoting mobility, strength and better balance, ROM. She will resume as possible 2.  Oxycodone for breakthrough pain 10mg  #60 this was refilled today We will continue the controlled substance monitoring program, this consists of regular clinic visits, examinations, routine drug screening, pill counts as well as use of 07/18/2015 Controlled Substance  Reporting System. NCCSRS was reviewed today.  3.Neuropathic pain seems better in general. Hold off on any new medication today 5. Ibuprofen PRN with food on a limited basis. May use tylenol also  6. Lidoderm patch to left hip/leg PRN, voltaren gel.ice for pain relief and heat to stretch prior to activities.  7. Will send for ua/ucx    Follow up with me in 6 months.Fifteen minutes of face to face patient care time were spent during this visit. All questions were encouraged and answered.

## 2019-10-18 ENCOUNTER — Other Ambulatory Visit (HOSPITAL_COMMUNITY): Payer: Self-pay | Admitting: Physical Medicine & Rehabilitation

## 2019-10-18 MED ORDER — CEPHALEXIN 250 MG PO CAPS: 250.0000 mg | ORAL_CAPSULE | Freq: Three times a day (TID) | ORAL | 0 refills | Status: AC

## 2020-04-14 ENCOUNTER — Other Ambulatory Visit: Payer: Self-pay

## 2020-04-14 ENCOUNTER — Encounter: Payer: Self-pay | Admitting: Physical Medicine & Rehabilitation

## 2020-04-14 ENCOUNTER — Encounter
Payer: Worker's Compensation | Attending: Physical Medicine & Rehabilitation | Admitting: Physical Medicine & Rehabilitation

## 2020-04-14 VITALS — BP 139/73 | HR 64 | Temp 98.7°F | Ht 68.0 in | Wt 183.4 lb

## 2020-04-14 DIAGNOSIS — M5416 Radiculopathy, lumbar region: Secondary | ICD-10-CM | POA: Diagnosis present

## 2020-04-14 DIAGNOSIS — S72002S Fracture of unspecified part of neck of left femur, sequela: Secondary | ICD-10-CM | POA: Diagnosis not present

## 2020-04-14 DIAGNOSIS — M898X9 Other specified disorders of bone, unspecified site: Secondary | ICD-10-CM | POA: Insufficient documentation

## 2020-04-14 DIAGNOSIS — Z79891 Long term (current) use of opiate analgesic: Secondary | ICD-10-CM | POA: Insufficient documentation

## 2020-04-14 DIAGNOSIS — Z5181 Encounter for therapeutic drug level monitoring: Secondary | ICD-10-CM | POA: Insufficient documentation

## 2020-04-14 DIAGNOSIS — G894 Chronic pain syndrome: Secondary | ICD-10-CM | POA: Insufficient documentation

## 2020-04-14 MED ORDER — OXYCODONE HCL 10 MG PO TABS: 10.0000 mg | ORAL_TABLET | Freq: Three times a day (TID) | ORAL | 0 refills | Status: DC | PRN

## 2020-04-14 NOTE — Patient Instructions (Signed)
PLEASE FEEL FREE TO CALL OUR OFFICE WITH ANY PROBLEMS OR QUESTIONS ([phone])      

## 2020-04-14 NOTE — Progress Notes (Signed)
Subjective:    Patient ID: April [name], female    DOB: 23-Apr-1925, 86 y.o.   MRN: [id number]  HPI   Mrs. Benassi is here in follow-up of her chronic pain syndrome and gait disorder.  Her pain levels have been fairly steady since we last talked.  She uses her oxycodone for her most severe pain.  She uses ibuprofen more daily.  Sometimes she uses Tylenol as well.   She doesn't feel as strong as she once did as her activity levels have not been as high. She wants to get back to the [facility].     Pain Inventory Average Pain 5 Pain Right Now 5 My pain is sharp, dull and aching  In the last 24 hours, has pain interfered with the following? General activity 0 Relation with others 0 Enjoyment of life 0 What TIME of day is your pain at its worst? evening Sleep (in general) Fair  Pain is worse with: walking, standing and some activites Pain improves with: rest and medication Relief from Meds: 5  Mobility walk with assistance use a walker how many minutes can you walk? 10 ability to climb steps?  no do you drive?  no  Function retired I need assistance with the following:  household duties and shopping  Neuro/Psych bladder control problems trouble walking  Prior Studies Any changes since last visit?  no  Physicians involved in your care Any changes since last visit?  no   No family history on file. Social History   Socioeconomic History  . Marital status: Single    Spouse name: Not on file  . Number of children: Not on file  . Years of education: Not on file  . Highest education level: Not on file  Occupational History  . Not on file  Tobacco Use  . Smoking status: Never Smoker  . Smokeless tobacco: Never Used  Substance and Sexual Activity  . Alcohol use: No  . Drug use: No  . Sexual activity: Never    Birth control/protection: Post-menopausal  Other Topics Concern  . Not on file  Social History Narrative  . Not on file   Social Determinants of  Health   Financial Resource Strain:   . Difficulty of Paying Living Expenses:   Food Insecurity:   . Worried About Programme researcher, broadcasting/film/video in the Last Year:   . Barista in the Last Year:   Transportation Needs:   . Freight forwarder (Medical):   Marland Kitchen Lack of Transportation (Non-Medical):   Physical Activity:   . Days of Exercise per Week:   . Minutes of Exercise per Session:   Stress:   . Feeling of Stress :   Social Connections:   . Frequency of Communication with Friends and Family:   . Frequency of Social Gatherings with Friends and Family:   . Attends Religious Services:   . Active Member of Clubs or Organizations:   . Attends Banker Meetings:   Marland Kitchen Marital Status:    Past Surgical History:  Procedure Laterality Date  . ADENOIDECTOMY    . CHOLECYSTECTOMY    . EYE SURGERY    . HIP ARTHROPLASTY Left 04/08/2014   Procedure: LEFT HIP HEMIARTHROPLASTY;  Surgeon: Verlee Rossetti, MD;  Location: [facility] OR;  Service: Orthopedics;  Laterality: Left;  . left cataract removed    . TONSILLECTOMY     Past Medical History:  Diagnosis Date  . Cataract    left eye  .  Chronic back pain    stenosis  . Constipation    takes an OTC stool softener   . Diabetes mellitus without complication ([place])    takes Lantus daily  . GERD (gastroesophageal reflux disease)    not on Protonix daily  . History of bronchitis 20 yrs ago  . History of lupus 3yrs ago  . History of shingles   . HTN (hypertension) 04/07/2014  . Hyperlipidemia 04/07/2014   takes Lovastatin daily  . Hypertension    takes Amlodipine and Atenolol daily  . Hypothyroidism 04/07/2014   takes Synthroid daily  . Joint pain   . Nocturia   . OA (osteoarthritis) 04/07/2014  . Peripheral edema   . Urinary frequency    takes Vesicare daily  . UTI (lower urinary tract infection)    completed antibioitcs on 07/28/14   BP 139/73   Pulse 64   Temp 98.7 F (37.1 C)   Ht 5\' 8"  (1.727 m)   Wt 183 lb 6.4 oz (83.2 kg)    SpO2 91%   BMI 27.89 kg/m   Opioid Risk Score:   Fall Risk Score:  `1  Depression screen PHQ 2/9  Depression screen [facility] 2/9 07/10/2018 11/19/2017 05/30/2016 03/06/2016 01/06/2016 08/18/2015 07/16/2015  Decreased Interest 1 0 0 1 0 0 0  Down, Depressed, Hopeless 1 0 0 0 0 0 0  PHQ - 2 Score 2 0 0 1 0 0 0  Altered sleeping - - - - - - -  Tired, decreased energy - - - - - - -  Change in appetite - - - - - - -  Feeling bad or failure about yourself  - - - - - - -  Trouble concentrating - - - - - - -  Moving slowly or fidgety/restless - - - - - - -  Suicidal thoughts - - - - - - -  PHQ-9 Score - - - - - - -    Review of Systems  Musculoskeletal: Positive for gait problem.  All other systems reviewed and are negative.      Objective:   Physical Exam   General: No acute distress HEENT: EOMI, oral membranes moist Cards: reg rate  Chest: normal effort Abdomen: Soft, NT, ND Skin: dry, intact Extremities: no edema  Musculoskeletal: left buttock pain and LB pain with palpation.  Neuro:  motor generally 4-5/5 in all 4 with pain inhibition LLE. Continued,  distal sensory loss.  Skin: Skin is warm and dry.  Psychiatric: Pleasant and appropriate   Assessment/Plan:  1. Left Lumbar 3 radiculopathy/stenosis s/p L3-4 laminectomy and fusion. With residual radiculopathy in the left lower extremity perhaps involving L3 and L4 2. Diabetes with peripheral neuropathy. 3. Heterotopic bone--left hip. This appears to be most of the cause of her increased left buttock pain 4. ?UTI       Plan:   1. Maintain activity as possible. The [employer] membership is important for promoting mobility, strength and better balance, ROM.   -wrote her a new rx for [facility] to access pool. Her balance/strength has suffered with covid. 2. Oxycodone for breakthrough pain 10mg  #60 this was refilled today We will continue the controlled substance monitoring program, this consists of regular clinic visits, examinations,  routine drug screening, pill counts as well as use of [place] Controlled Substance Reporting System. [place] was reviewed today.     3.  Neuropathic pain under control 5. Ibuprofen prn for pain with food, rare tylenol.   6. Lidoderm/voltaren  are both over the counter now. Can use prn         Follow up with me in 4 months.  Fifteen minutes of face to face patient care time were spent during this visit. All questions were encouraged and answered.

## 2020-04-18 LAB — DRUG TOX MONITOR 1 W/CONF, ORAL FLD
Amphetamines: NEGATIVE ng/mL (ref <10)
Barbiturates: NEGATIVE ng/mL (ref <10)
Benzodiazepines: NEGATIVE ng/mL (ref <0.50)
Buprenorphine: NEGATIVE ng/mL (ref <0.10)
Cocaine: NEGATIVE ng/mL (ref <5.0)
Codeine: NEGATIVE ng/mL (ref <2.5)
Dihydrocodeine: NEGATIVE ng/mL (ref <2.5)
Fentanyl: NEGATIVE ng/mL (ref <0.10)
Heroin Metabolite: NEGATIVE ng/mL (ref <1.0)
Hydrocodone: NEGATIVE ng/mL (ref <2.5)
Hydromorphone: NEGATIVE ng/mL (ref <2.5)
MARIJUANA: NEGATIVE ng/mL (ref <2.5)
MDMA: NEGATIVE ng/mL (ref <10)
Meprobamate: NEGATIVE ng/mL (ref <2.5)
Methadone: NEGATIVE ng/mL (ref <5.0)
Morphine: NEGATIVE ng/mL (ref <2.5)
Nicotine Metabolite: NEGATIVE ng/mL (ref <5.0)
Norhydrocodone: NEGATIVE ng/mL (ref <2.5)
Noroxycodone: 3.7 ng/mL — ABNORMAL HIGH (ref <2.5)
Opiates: POSITIVE ng/mL — AB (ref <2.5)
Oxycodone: 64.2 ng/mL — ABNORMAL HIGH (ref <2.5)
Oxymorphone: NEGATIVE ng/mL (ref <2.5)
Phencyclidine: NEGATIVE ng/mL (ref <10)
Tapentadol: NEGATIVE ng/mL (ref <5.0)
Tramadol: NEGATIVE ng/mL (ref <5.0)
Zolpidem: NEGATIVE ng/mL (ref <5.0)

## 2020-04-18 LAB — DRUG TOX ALC METAB W/CON, ORAL FLD: Alcohol Metabolite: NEGATIVE ng/mL (ref <25)

## 2020-04-19 ENCOUNTER — Telehealth: Payer: Self-pay | Admitting: *Deleted

## 2020-04-19 NOTE — Telephone Encounter (Signed)
Oral swab drug screen was consistent for prescribed medications.  ?

## 2020-07-07 ENCOUNTER — Telehealth: Payer: Self-pay | Admitting: *Deleted

## 2020-07-07 DIAGNOSIS — S72002S Fracture of unspecified part of neck of left femur, sequela: Secondary | ICD-10-CM

## 2020-07-07 DIAGNOSIS — G894 Chronic pain syndrome: Secondary | ICD-10-CM

## 2020-07-07 DIAGNOSIS — M898X9 Other specified disorders of bone, unspecified site: Secondary | ICD-10-CM

## 2020-07-07 MED ORDER — OXYCODONE HCL 10 MG PO TABS: 10.0000 mg | ORAL_TABLET | Freq: Three times a day (TID) | ORAL | 0 refills | Status: DC | PRN

## 2020-07-07 NOTE — Addendum Note (Signed)
Addended by: Faith Rogue T on: 07/07/2020 04:45 PM   Modules accepted: Orders

## 2020-07-07 NOTE — Telephone Encounter (Signed)
[name] called for refill of her oxycodone.  Her last appt was 4 21 21  and next appt with Dr is 08/11/20.  Last refill per PMP was   07/03/2020 Hydrocodone-Acetamin 5-325 Mg # 10.00    09/03/2020, MD [facility] [address]   [facility] 04/14/2020  Oxycodone Hcl 10 Mg Tablet # 60.00 04/16/2020

## 2020-07-07 NOTE — Telephone Encounter (Signed)
Please remind her that she signed a contract with [place]. She is not to receive controlled substances from another provider

## 2020-08-11 ENCOUNTER — Other Ambulatory Visit: Payer: Self-pay

## 2020-08-11 ENCOUNTER — Encounter
Payer: Worker's Compensation | Attending: Physical Medicine & Rehabilitation | Admitting: Physical Medicine & Rehabilitation

## 2020-08-11 ENCOUNTER — Encounter: Payer: Self-pay | Admitting: Physical Medicine & Rehabilitation

## 2020-08-11 VITALS — BP 119/74 | HR 63 | Temp 98.3°F | Ht 68.0 in | Wt 175.0 lb

## 2020-08-11 DIAGNOSIS — S72002S Fracture of unspecified part of neck of left femur, sequela: Secondary | ICD-10-CM

## 2020-08-11 DIAGNOSIS — Z5181 Encounter for therapeutic drug level monitoring: Secondary | ICD-10-CM | POA: Diagnosis not present

## 2020-08-11 DIAGNOSIS — Z79891 Long term (current) use of opiate analgesic: Secondary | ICD-10-CM | POA: Diagnosis not present

## 2020-08-11 DIAGNOSIS — M898X9 Other specified disorders of bone, unspecified site: Secondary | ICD-10-CM

## 2020-08-11 DIAGNOSIS — G894 Chronic pain syndrome: Secondary | ICD-10-CM

## 2020-08-11 MED ORDER — OXYCODONE HCL 10 MG PO TABS: 10.0000 mg | ORAL_TABLET | Freq: Three times a day (TID) | ORAL | 0 refills | Status: DC | PRN

## 2020-08-11 NOTE — Progress Notes (Signed)
Subjective:    Patient ID: April [name], female    DOB: January 19, 1925, 86 y.o.   MRN: [id number]  HPI April [name] is here in follow-up of her chronic pain syndrome and gait disorder.  I last saw her in April.. She fell and broke her wrist about 6 weeks ago. Her w/c flipped over as well last week. She's using a platform walker currently.   Her pain levels are nearing baseline although she reports more tightness and pain in her left hip.  She is also dealing with some pressure wounds on her sacral area.  She continues on oxycodone for pain.  Pain Inventory Average Pain 6 Pain Right Now 6 My pain is burning and aching  In the last 24 hours, has pain interfered with the following? General activity 6 Relation with others 0 Enjoyment of life 6 What TIME of day is your pain at its worst? daytime and evening Sleep (in general) Fair  Pain is worse with: sitting Pain improves with: rest, heat/ice and medication Relief from Meds: 5  No family history on file. Social History   Socioeconomic History  . Marital status: Single    Spouse name: Not on file  . Number of children: Not on file  . Years of education: Not on file  . Highest education level: Not on file  Occupational History  . Not on file  Tobacco Use  . Smoking status: Never Smoker  . Smokeless tobacco: Never Used  Substance and Sexual Activity  . Alcohol use: No  . Drug use: No  . Sexual activity: Never    Birth control/protection: Post-menopausal  Other Topics Concern  . Not on file  Social History Narrative  . Not on file   Social Determinants of Health   Financial Resource Strain:   . Difficulty of Paying Living Expenses:   Food Insecurity:   . Worried About Programme researcher, broadcasting/film/video in the Last Year:   . Barista in the Last Year:   Transportation Needs:   . Freight forwarder (Medical):   Marland Kitchen Lack of Transportation (Non-Medical):   Physical Activity:   . Days of Exercise per Week:   . Minutes  of Exercise per Session:   Stress:   . Feeling of Stress :   Social Connections:   . Frequency of Communication with Friends and Family:   . Frequency of Social Gatherings with Friends and Family:   . Attends Religious Services:   . Active Member of Clubs or Organizations:   . Attends Banker Meetings:   Marland Kitchen Marital Status:    Past Surgical History:  Procedure Laterality Date  . ADENOIDECTOMY    . CHOLECYSTECTOMY    . EYE SURGERY    . HIP ARTHROPLASTY Left 04/08/2014   Procedure: LEFT HIP HEMIARTHROPLASTY;  Surgeon: Verlee Rossetti, MD;  Location: [facility] OR;  Service: Orthopedics;  Laterality: Left;  . left cataract removed    . TONSILLECTOMY     Past Surgical History:  Procedure Laterality Date  . ADENOIDECTOMY    . CHOLECYSTECTOMY    . EYE SURGERY    . HIP ARTHROPLASTY Left 04/08/2014   Procedure: LEFT HIP HEMIARTHROPLASTY;  Surgeon: Verlee Rossetti, MD;  Location: [facility] OR;  Service: Orthopedics;  Laterality: Left;  . left cataract removed    . TONSILLECTOMY     Past Medical History:  Diagnosis Date  . Cataract    left eye  . Chronic back pain  stenosis  . Constipation    takes an OTC stool softener   . Diabetes mellitus without complication (HCC)    takes Lantus daily  . GERD (gastroesophageal reflux disease)    not on Protonix daily  . History of bronchitis 20 yrs ago  . History of lupus 4yrs ago  . History of shingles   . HTN (hypertension) 04/07/2014  . Hyperlipidemia 04/07/2014   takes Lovastatin daily  . Hypertension    takes Amlodipine and Atenolol daily  . Hypothyroidism 04/07/2014   takes Synthroid daily  . Joint pain   . Nocturia   . OA (osteoarthritis) 04/07/2014  . Peripheral edema   . Urinary frequency    takes Vesicare daily  . UTI (lower urinary tract infection)    completed antibioitcs on 07/28/14   BP 119/74   Pulse 63   Temp 98.3 F (36.8 C)   Ht 5\' 8"  (1.727 m)   Wt 175 lb (79.4 kg)   SpO2 96%   BMI 26.61 kg/m   Opioid Risk  Score:   Fall Risk Score:  `1  Depression screen PHQ 2/9  Depression screen [facility] 2/9 07/10/2018 11/19/2017 05/30/2016 03/06/2016 01/06/2016 08/18/2015 07/16/2015  Decreased Interest 1 0 0 1 0 0 0  Down, Depressed, Hopeless 1 0 0 0 0 0 0  PHQ - 2 Score 2 0 0 1 0 0 0  Altered sleeping - - - - - - -  Tired, decreased energy - - - - - - -  Change in appetite - - - - - - -  Feeling bad or failure about yourself  - - - - - - -  Trouble concentrating - - - - - - -  Moving slowly or fidgety/restless - - - - - - -  Suicidal thoughts - - - - - - -  PHQ-9 Score - - - - - - -   Review of Systems  Genitourinary: Positive for dysuria and hematuria.  Musculoskeletal: Positive for back pain and gait problem.  Neurological: Positive for tremors, weakness and numbness.       Tingling  All other systems reviewed and are negative.      Objective:   Physical Exam  General: No acute distress HEENT: EOMI, oral membranes moist Cards: reg rate  Chest: normal effort Abdomen: Soft, NT, ND Skin: dry, intact Extremities: no edema  Musculoskeletal: left buttock pain and LB pain with palpation ongoing.  She has more tightness with flexion extension today.  07/18/2015 is set too high for her as well..  Neuro:  motor generally 4-5/5 in all 4 with pain inhibition LLE. Continued,  distal sensory loss ongoing.   Psychiatric:  Pleasant as always   Assessment/Plan:  1. Left Lumbar 3 radiculopathy/stenosis s/p L3-4 laminectomy and fusion. With residual radiculopathy in the left lower extremity perhaps involving L3 and L4 2. Diabetes with peripheral neuropathy. 3. Heterotopic bone--left hip. This appears to be most of the cause of her increased left buttock pain 4. ?UTI       Plan:   1. Maintain activity as possible. The [employer] membership is important for promoting mobility, strength and better balance, ROM.              -Make a referral to [facility] physical therapy in [place] to address gait, posture,  lower extremity strength and to make recommendations regarding a new walker for her.  I would like her to start about 3 to 4 weeks from now  after she she has the platform attachment to her walker. 2. Oxycodone for breakthrough pain 10mg  #60 this was refilled today We will continue the controlled substance monitoring program, this consists of regular clinic visits, examinations, routine drug screening, pill counts as well as use of Controlled Substance Reporting System. [place] today.   3.  Neuropathic pain under control 5. Ibuprofen prn for pain with food, rare tylenol. --Continue  6. Lidoderm/voltaren are both over the counter now. Can use prn         Follow up with me in 4 months.  Fifteen minutes of face to face patient care time were spent during this visit. All questions were encouraged and answered.           Assessment & Plan:

## 2020-08-11 NOTE — Patient Instructions (Signed)
PLEASE FEEL FREE TO CALL OUR OFFICE WITH ANY PROBLEMS OR QUESTIONS ([phone])      

## 2020-08-21 LAB — DRUG TOX MONITOR 1 W/CONF, ORAL FLD
Amphetamines: NEGATIVE ng/mL (ref <10)
Barbiturates: NEGATIVE ng/mL (ref <10)
Benzodiazepines: NEGATIVE ng/mL (ref <0.50)
Buprenorphine: NEGATIVE ng/mL (ref <0.10)
Cocaine: NEGATIVE ng/mL (ref <5.0)
Codeine: NEGATIVE ng/mL (ref <2.5)
Dihydrocodeine: NEGATIVE ng/mL (ref <2.5)
Fentanyl: NEGATIVE ng/mL (ref <0.10)
Heroin Metabolite: NEGATIVE ng/mL (ref <1.0)
Hydrocodone: NEGATIVE ng/mL (ref <2.5)
Hydromorphone: NEGATIVE ng/mL (ref <2.5)
MARIJUANA: NEGATIVE ng/mL (ref <2.5)
MDMA: NEGATIVE ng/mL (ref <10)
Meprobamate: NEGATIVE ng/mL (ref <2.5)
Methadone: NEGATIVE ng/mL (ref <5.0)
Morphine: NEGATIVE ng/mL (ref <2.5)
Nicotine Metabolite: NEGATIVE ng/mL (ref <5.0)
Norhydrocodone: NEGATIVE ng/mL (ref <2.5)
Noroxycodone: NEGATIVE ng/mL (ref <2.5)
Opiates: NEGATIVE ng/mL (ref <2.5)
Oxymorphone: NEGATIVE ng/mL (ref <2.5)
Phencyclidine: NEGATIVE ng/mL (ref <10)
Tapentadol: NEGATIVE ng/mL (ref <5.0)
Tramadol: NEGATIVE ng/mL (ref <5.0)
Zolpidem: NEGATIVE ng/mL (ref <5.0)

## 2020-08-21 LAB — DRUG TOX ALC METAB W/CON, ORAL FLD: Alcohol Metabolite: NEGATIVE ng/mL (ref <25)

## 2020-08-26 ENCOUNTER — Telehealth: Payer: Self-pay | Admitting: *Deleted

## 2020-08-26 NOTE — Telephone Encounter (Signed)
Oral swab drug screen was insufficient quantity to test for the for prescribed medications.

## 2020-10-06 ENCOUNTER — Telehealth: Payer: Self-pay

## 2020-10-06 DIAGNOSIS — G894 Chronic pain syndrome: Secondary | ICD-10-CM

## 2020-10-06 DIAGNOSIS — S72002S Fracture of unspecified part of neck of left femur, sequela: Secondary | ICD-10-CM

## 2020-10-06 DIAGNOSIS — M898X9 Other specified disorders of bone, unspecified site: Secondary | ICD-10-CM

## 2020-10-06 MED ORDER — OXYCODONE HCL 10 MG PO TABS: 10.0000 mg | ORAL_TABLET | Freq: Three times a day (TID) | ORAL | 0 refills | Status: DC | PRN

## 2020-10-06 NOTE — Telephone Encounter (Signed)
Placed a call to Ms. Gaynell Face , spoke with Ms. Protzman and her daughter Jasmine December. Jasmine December states " she dispenses April [name] oxycodone. PMP was reviewed and Oxycodone was e-scribed, she verbalizes understanding.

## 2020-10-06 NOTE — Telephone Encounter (Signed)
Request refill Oxycodone. Last filled #60 on 08/15/2020

## 2020-11-05 ENCOUNTER — Telehealth: Payer: Self-pay

## 2020-11-05 DIAGNOSIS — S72002S Fracture of unspecified part of neck of left femur, sequela: Secondary | ICD-10-CM

## 2020-11-05 DIAGNOSIS — G894 Chronic pain syndrome: Secondary | ICD-10-CM

## 2020-11-05 DIAGNOSIS — M898X9 Other specified disorders of bone, unspecified site: Secondary | ICD-10-CM

## 2020-11-05 MED ORDER — OXYCODONE HCL 10 MG PO TABS: 10.0000 mg | ORAL_TABLET | Freq: Three times a day (TID) | ORAL | 0 refills | Status: DC | PRN

## 2020-11-05 NOTE — Telephone Encounter (Signed)
PMP was reviewed. Placed a call to Ms. April [name] regarding her Oxycodone, she is aware Oxycodone was e-scribed. She verbalizes understanding.

## 2020-11-05 NOTE — Telephone Encounter (Signed)
Refill request for Oxycodone, last sent 10/06/2020, next appt with Dr. Riley Kill 12/08/2020

## 2020-12-08 ENCOUNTER — Encounter: Payer: Medicare HMO | Admitting: Physical Medicine & Rehabilitation

## 2021-01-19 ENCOUNTER — Encounter
Payer: Worker's Compensation | Attending: Physical Medicine & Rehabilitation | Admitting: Physical Medicine & Rehabilitation

## 2021-01-19 ENCOUNTER — Encounter: Payer: Self-pay | Admitting: Physical Medicine & Rehabilitation

## 2021-01-19 ENCOUNTER — Other Ambulatory Visit: Payer: Self-pay

## 2021-01-19 VITALS — BP 131/60 | HR 64 | Temp 98.1°F | Ht 68.0 in | Wt 171.4 lb

## 2021-01-19 DIAGNOSIS — Z5181 Encounter for therapeutic drug level monitoring: Secondary | ICD-10-CM | POA: Insufficient documentation

## 2021-01-19 DIAGNOSIS — Z79891 Long term (current) use of opiate analgesic: Secondary | ICD-10-CM | POA: Insufficient documentation

## 2021-01-19 DIAGNOSIS — M898X9 Other specified disorders of bone, unspecified site: Secondary | ICD-10-CM | POA: Diagnosis present

## 2021-01-19 DIAGNOSIS — G894 Chronic pain syndrome: Secondary | ICD-10-CM | POA: Insufficient documentation

## 2021-01-19 DIAGNOSIS — S72002S Fracture of unspecified part of neck of left femur, sequela: Secondary | ICD-10-CM | POA: Diagnosis not present

## 2021-01-19 DIAGNOSIS — E1142 Type 2 diabetes mellitus with diabetic polyneuropathy: Secondary | ICD-10-CM | POA: Diagnosis present

## 2021-01-19 MED ORDER — OXYCODONE HCL 10 MG PO TABS: 10.0000 mg | ORAL_TABLET | Freq: Three times a day (TID) | ORAL | 0 refills | Status: DC | PRN

## 2021-01-19 NOTE — Progress Notes (Signed)
Subjective:    Patient ID: April [name], female    DOB: 06/11/1925, 87 y.o.   MRN: [id number]  HPI  Cyniah is here in follow up of her lumbar radiculopathy, neuropathy and associated pain and gait disorder. She feels that her left leg has gotten slowly weaker over the last several months d/t decreased activity which has been multifactorial but largely d/t covid and being at home more.   She does feel that she might have a UTI. She plans to see her primary for this. She denies any other new health issues.   She is taking oxycodone for pain control which works for her and provides better quality of life. She uses her rolling walker for balance. She has not had any falls.   Pain Inventory Average Pain 5 Pain Right Now 5 My pain is intermittent, burning and dull  In the last 24 hours, has pain interfered with the following? General activity 3 Relation with others 3 Enjoyment of life 3 What TIME of day is your pain at its worst? evening Sleep (in general) Fair  Pain is worse with: walking and unsure Pain improves with: heat/ice, therapy/exercise and medication Relief from Meds: 5  No family history on file. Social History   Socioeconomic History  . Marital status: Single    Spouse name: Not on file  . Number of children: Not on file  . Years of education: Not on file  . Highest education level: Not on file  Occupational History  . Not on file  Tobacco Use  . Smoking status: Never Smoker  . Smokeless tobacco: Never Used  Substance and Sexual Activity  . Alcohol use: No  . Drug use: No  . Sexual activity: Never    Birth control/protection: Post-menopausal  Other Topics Concern  . Not on file  Social History Narrative  . Not on file   Social Determinants of Health   Financial Resource Strain: Not on file  Food Insecurity: Not on file  Transportation Needs: Not on file  Physical Activity: Not on file  Stress: Not on file  Social Connections: Not on file    Past Surgical History:  Procedure Laterality Date  . ADENOIDECTOMY    . CHOLECYSTECTOMY    . EYE SURGERY    . HIP ARTHROPLASTY Left 04/08/2014   Procedure: LEFT HIP HEMIARTHROPLASTY;  Surgeon: Verlee Rossetti, MD;  Location: [facility] OR;  Service: Orthopedics;  Laterality: Left;  . left cataract removed    . TONSILLECTOMY     Past Surgical History:  Procedure Laterality Date  . ADENOIDECTOMY    . CHOLECYSTECTOMY    . EYE SURGERY    . HIP ARTHROPLASTY Left 04/08/2014   Procedure: LEFT HIP HEMIARTHROPLASTY;  Surgeon: Verlee Rossetti, MD;  Location: [facility] OR;  Service: Orthopedics;  Laterality: Left;  . left cataract removed    . TONSILLECTOMY     Past Medical History:  Diagnosis Date  . Cataract    left eye  . Chronic back pain    stenosis  . Constipation    takes an OTC stool softener   . Diabetes mellitus without complication (HCC)    takes Lantus daily  . GERD (gastroesophageal reflux disease)    not on Protonix daily  . History of bronchitis 20 yrs ago  . History of lupus 22yrs ago  . History of shingles   . HTN (hypertension) 04/07/2014  . Hyperlipidemia 04/07/2014   takes Lovastatin daily  . Hypertension  takes Amlodipine and Atenolol daily  . Hypothyroidism 04/07/2014   takes Synthroid daily  . Joint pain   . Nocturia   . OA (osteoarthritis) 04/07/2014  . Peripheral edema   . Urinary frequency    takes Vesicare daily  . UTI (lower urinary tract infection)    completed antibioitcs on 07/28/14   There were no vitals taken for this visit.  Opioid Risk Score:   Fall Risk Score:  `1  Depression screen PHQ 2/9  Depression screen [facility] 2/9 07/10/2018 11/19/2017 05/30/2016 03/06/2016 01/06/2016 08/18/2015 07/16/2015  Decreased Interest 1 0 0 1 0 0 0  Down, Depressed, Hopeless 1 0 0 0 0 0 0  PHQ - 2 Score 2 0 0 1 0 0 0  Altered sleeping - - - - - - -  Tired, decreased energy - - - - - - -  Change in appetite - - - - - - -  Feeling bad or failure about yourself  - - - - - - -   Trouble concentrating - - - - - - -  Moving slowly or fidgety/restless - - - - - - -  Suicidal thoughts - - - - - - -  PHQ-9 Score - - - - - - -   Review of Systems     Objective:   Physical Exam General: No acute distress HEENT: EOMI, oral membranes moist Cards: reg rate  Chest: normal effort Abdomen: Soft, NT, ND Skin: dry, intact Extremities: no edema Psych: pleasant and appropriate  Musculoskeletal: mild left buttock and hip discomfort with palpation. Antalgic on left lower. Still manages to handle walker fairly well.   Neuro:  motor generally 4-5/5 in all 4 with pain inhibition LLE. Continued,  distal sensory loss ongoing without change.        Assessment/Plan:  1. Left Lumbar 3 radiculopathy/stenosis s/p L3-4 laminectomy and fusion. With residual radiculopathy in the left lower extremity perhaps involving L3 and L4 2. Diabetes with peripheral neuropathy. 3. Heterotopic bone--left hip. This appears to be most of the cause of her increased left buttock pain 4. ?UTI       Plan:   1. Made referral to [facility] PT to address LLE strength and balance and to set up HEP. 2. Oxycodone for breakthrough pain 10mg  #60 this was RF today We will continue the controlled substance monitoring program, this consists of regular clinic visits, examinations, routine drug screening, pill counts as well as use of Controlled Substance Reporting System. [place] was [place] today.    3.  Neuropathic pain under control 4. Bladder rx per primary 5. Ibuprofen prn for pain with food, rare tylenol. --Continue  6. Lidoderm/voltaren are both over the counter now. Can use prn

## 2021-01-19 NOTE — Patient Instructions (Signed)
PLEASE FEEL FREE TO CALL OUR OFFICE WITH ANY PROBLEMS OR QUESTIONS ([phone])      

## 2021-01-23 LAB — DRUG TOX MONITOR 1 W/CONF, ORAL FLD
Amphetamines: NEGATIVE ng/mL (ref <10)
Barbiturates: NEGATIVE ng/mL (ref <10)
Benzodiazepines: NEGATIVE ng/mL (ref <0.50)
Buprenorphine: NEGATIVE ng/mL (ref <0.10)
Cocaine: NEGATIVE ng/mL (ref <5.0)
Codeine: NEGATIVE ng/mL (ref <2.5)
Dihydrocodeine: NEGATIVE ng/mL (ref <2.5)
Fentanyl: NEGATIVE ng/mL (ref <0.10)
Heroin Metabolite: NEGATIVE ng/mL (ref <1.0)
Hydrocodone: NEGATIVE ng/mL (ref <2.5)
Hydromorphone: NEGATIVE ng/mL (ref <2.5)
MARIJUANA: NEGATIVE ng/mL (ref <2.5)
MDMA: NEGATIVE ng/mL (ref <10)
Meprobamate: NEGATIVE ng/mL (ref <2.5)
Methadone: NEGATIVE ng/mL (ref <5.0)
Morphine: NEGATIVE ng/mL (ref <2.5)
Nicotine Metabolite: NEGATIVE ng/mL (ref <5.0)
Norhydrocodone: NEGATIVE ng/mL (ref <2.5)
Noroxycodone: 4.1 ng/mL — ABNORMAL HIGH (ref <2.5)
Opiates: POSITIVE ng/mL — AB (ref <2.5)
Oxycodone: 61.5 ng/mL — ABNORMAL HIGH (ref <2.5)
Oxymorphone: NEGATIVE ng/mL (ref <2.5)
Phencyclidine: NEGATIVE ng/mL (ref <10)
Tapentadol: NEGATIVE ng/mL (ref <5.0)
Tramadol: NEGATIVE ng/mL (ref <5.0)
Zolpidem: NEGATIVE ng/mL (ref <5.0)

## 2021-01-23 LAB — DRUG TOX ALC METAB W/CON, ORAL FLD: Alcohol Metabolite: NEGATIVE ng/mL (ref <25)

## 2021-01-31 ENCOUNTER — Telehealth: Payer: Self-pay | Admitting: *Deleted

## 2021-01-31 NOTE — Telephone Encounter (Signed)
Oral swab drug screen was consistent for prescribed medications.  ?

## 2021-04-01 ENCOUNTER — Telehealth: Payer: Self-pay | Admitting: *Deleted

## 2021-04-01 DIAGNOSIS — S72002S Fracture of unspecified part of neck of left femur, sequela: Secondary | ICD-10-CM

## 2021-04-01 DIAGNOSIS — M898X9 Other specified disorders of bone, unspecified site: Secondary | ICD-10-CM

## 2021-04-01 DIAGNOSIS — G894 Chronic pain syndrome: Secondary | ICD-10-CM

## 2021-04-01 MED ORDER — OXYCODONE HCL 10 MG PO TABS
10.0000 mg | ORAL_TABLET | Freq: Three times a day (TID) | ORAL | 0 refills | Status: DC | PRN
Start: 2021-04-01 — End: 2021-05-18

## 2021-04-01 NOTE — Telephone Encounter (Signed)
Notified Mrs Jons.

## 2021-04-01 NOTE — Telephone Encounter (Signed)
I sent the Rx to the pharmacy. thx

## 2021-04-01 NOTE — Telephone Encounter (Signed)
April [name] requests a refill on her oxycodone. Per PMP last fill date was 01/24/21 and her next appt is 05/18/21

## 2021-05-18 ENCOUNTER — Encounter: Payer: Worker's Compensation | Admitting: Physical Medicine & Rehabilitation

## 2021-05-18 ENCOUNTER — Telehealth: Payer: Self-pay

## 2021-05-18 DIAGNOSIS — S72002S Fracture of unspecified part of neck of left femur, sequela: Secondary | ICD-10-CM

## 2021-05-18 DIAGNOSIS — G894 Chronic pain syndrome: Secondary | ICD-10-CM

## 2021-05-18 DIAGNOSIS — M898X9 Other specified disorders of bone, unspecified site: Secondary | ICD-10-CM

## 2021-05-18 MED ORDER — OXYCODONE HCL 10 MG PO TABS: 10.0000 mg | ORAL_TABLET | Freq: Three times a day (TID) | ORAL | 0 refills | Status: DC | PRN

## 2021-05-18 NOTE — Telephone Encounter (Signed)
Patient requesting refill on Oxycodone. Next appt 07/06/2021.

## 2021-05-18 NOTE — Telephone Encounter (Signed)
Med refilled.

## 2021-07-06 ENCOUNTER — Encounter: Payer: Worker's Compensation | Admitting: Physical Medicine & Rehabilitation

## 2021-07-27 ENCOUNTER — Encounter: Payer: Self-pay | Admitting: Physical Medicine & Rehabilitation

## 2021-07-27 ENCOUNTER — Other Ambulatory Visit: Payer: Self-pay

## 2021-07-27 ENCOUNTER — Encounter
Payer: Worker's Compensation | Attending: Physical Medicine & Rehabilitation | Admitting: Physical Medicine & Rehabilitation

## 2021-07-27 VITALS — BP 170/78 | HR 71 | Temp 98.2°F | Ht 68.0 in | Wt 168.0 lb

## 2021-07-27 DIAGNOSIS — M5416 Radiculopathy, lumbar region: Secondary | ICD-10-CM | POA: Diagnosis not present

## 2021-07-27 DIAGNOSIS — S72002S Fracture of unspecified part of neck of left femur, sequela: Secondary | ICD-10-CM | POA: Diagnosis present

## 2021-07-27 DIAGNOSIS — G894 Chronic pain syndrome: Secondary | ICD-10-CM

## 2021-07-27 DIAGNOSIS — M898X9 Other specified disorders of bone, unspecified site: Secondary | ICD-10-CM | POA: Diagnosis not present

## 2021-07-27 MED ORDER — OXYCODONE HCL 10 MG PO TABS: 10.0000 mg | ORAL_TABLET | Freq: Three times a day (TID) | ORAL | 0 refills | Status: DC | PRN

## 2021-07-27 NOTE — Patient Instructions (Signed)
PLEASE FEEL FREE TO CALL OUR OFFICE WITH ANY PROBLEMS OR QUESTIONS ([phone])      

## 2021-07-27 NOTE — Progress Notes (Signed)
Subjective:    Patient ID: April [name], female    DOB: 1925/08/29, 87 y.o.   MRN: [id number]  HPI  April [name] is here in follow-up of her chronic gait disorder and left hip pain.  I last saw her in January.  She feels that over the last few months she is had further decline in her mobility with increased pain and weakness in the left hip and proximal upper extremity.  She did receive some therapy in the winter which provided some immediate benefit but she was unable to sustain this.  She admittedly has not been good with her home program and still is not getting out of the house very much largely due to COVID.  She is planning on starting back to church with her daughter.   Pain remains generally in the left hip although she does have some uncomfortable sensations in the feet due to her neuropathy.  She remains on oxycodone for pain control and usually takes 1-2 of these per day.  Pain Inventory Average Pain 5 Pain Right Now 4 My pain is constant, stabbing, and aching  In the last 24 hours, has pain interfered with the following? General activity 0 Relation with others 0 Enjoyment of life 0 What TIME of day is your pain at its worst? evening Sleep (in general) Good  Pain is worse with: walking and some activites Pain improves with: rest and medication Relief from Meds: 8  No family history on file. Social History   Socioeconomic History   Marital status: Single    Spouse name: Not on file   Number of children: Not on file   Years of education: Not on file   Highest education level: Not on file  Occupational History   Not on file  Tobacco Use   Smoking status: Never   Smokeless tobacco: Never  Vaping Use   Vaping Use: Never used  Substance and Sexual Activity   Alcohol use: No   Drug use: No   Sexual activity: Never    Birth control/protection: Post-menopausal  Other Topics Concern   Not on file  Social History Narrative   Not on file   Social  Determinants of Health   Financial Resource Strain: Not on file  Food Insecurity: Not on file  Transportation Needs: Not on file  Physical Activity: Not on file  Stress: Not on file  Social Connections: Not on file   Past Surgical History:  Procedure Laterality Date   ADENOIDECTOMY     CHOLECYSTECTOMY     EYE SURGERY     HIP ARTHROPLASTY Left 04/08/2014   Procedure: LEFT HIP HEMIARTHROPLASTY;  Surgeon: Verlee Rossetti, MD;  Location: [facility] OR;  Service: Orthopedics;  Laterality: Left;   left cataract removed     TONSILLECTOMY     Past Surgical History:  Procedure Laterality Date   ADENOIDECTOMY     CHOLECYSTECTOMY     EYE SURGERY     HIP ARTHROPLASTY Left 04/08/2014   Procedure: LEFT HIP HEMIARTHROPLASTY;  Surgeon: Verlee Rossetti, MD;  Location: [facility] OR;  Service: Orthopedics;  Laterality: Left;   left cataract removed     TONSILLECTOMY     Past Medical History:  Diagnosis Date   Cataract    left eye   Chronic back pain    stenosis   Constipation    takes an OTC stool softener    Diabetes mellitus without complication (HCC)    takes Lantus daily   GERD (gastroesophageal  reflux disease)    not on Protonix daily   History of bronchitis 20 yrs ago   History of lupus 50yrs ago   History of shingles    HTN (hypertension) 04/07/2014   Hyperlipidemia 04/07/2014   takes Lovastatin daily   Hypertension    takes Amlodipine and Atenolol daily   Hypothyroidism 04/07/2014   takes Synthroid daily   Joint pain    Nocturia    OA (osteoarthritis) 04/07/2014   Peripheral edema    Urinary frequency    takes Vesicare daily   UTI (lower urinary tract infection)    completed antibioitcs on 07/28/14   Ht 5\' 8"  (1.727 m)   BMI 26.06 kg/m   Opioid Risk Score:   Fall Risk Score:  `1  Depression screen PHQ 2/9  Depression screen [facility] 2/9 01/19/2021 07/10/2018 11/19/2017 05/30/2016 03/06/2016 01/06/2016 08/18/2015  Decreased Interest 0 1 0 0 1 0 0  Down, Depressed, Hopeless 0 1 0 0 0 0 0  PHQ  - 2 Score 0 2 0 0 1 0 0  Altered sleeping - - - - - - -  Tired, decreased energy - - - - - - -  Change in appetite - - - - - - -  Feeling bad or failure about yourself  - - - - - - -  Trouble concentrating - - - - - - -  Moving slowly or fidgety/restless - - - - - - -  Suicidal thoughts - - - - - - -  PHQ-9 Score - - - - - - -    Review of Systems  Musculoskeletal:  Positive for back pain and gait problem.       Left hip & left back pain  All other systems reviewed and are negative.     Objective:   Physical Exam General: No acute distress HEENT: NCAT, EOMI, oral membranes moist Cards: reg rate  Chest: normal effort Abdomen: Soft, NT, ND Skin: dry, intact Extremities: no edema Psych: pleasant and appropriate   Musculoskeletal: mild left buttock and hip discomfort with palpation. Remains antalgic on left lower. Still manages to handle walker fairly well.   Neuro:  motor generally 4-5/5 UE, 3-4/5 Left HF, HE,  distal sensory loss ongoing without change is present       Assessment/Plan: 1. Left Lumbar 3 radiculopathy/stenosis s/p L3-4 laminectomy and fusion. With residual radiculopathy in the left lower extremity perhaps involving L3 and L4 2. Diabetes with peripheral neuropathy. 3. Heterotopic bone--left hip. This appears to be most of the cause of her increased left buttock pain 4. ?UTI       Plan:   1. Mrs Loney has lost some of her strength in the LE and has further impairment in her balance. She made some progress initially in January but has fallen back again. Made another referral to [facility] PT to address LLE strength and balance and to set up HEP. She needs a sustainable HEP. 2. Oxycodone for breakthrough pain 10mg  #60 this was RF today We will continue the controlled substance monitoring program, this consists of regular clinic visits, examinations, routine drug screening, pill counts as well as use of [facility] Controlled Substance Reporting System.  [place] was reviewed today.     3.  Neuropathic pain remains and affects gait as well.  4. Bladder rx per primary 5. Ibuprofen prn for pain with food, rare tylenol. --Continue  6. Lidoderm/voltaren are both over the counter now. Can use prn  Fifteen minutes  of face to face patient care time were spent during this visit. All questions were encouraged and answered.  Follow up with me in 6 mos .

## 2021-09-02 ENCOUNTER — Telehealth: Payer: Self-pay

## 2021-09-02 DIAGNOSIS — G894 Chronic pain syndrome: Secondary | ICD-10-CM

## 2021-09-02 DIAGNOSIS — M898X9 Other specified disorders of bone, unspecified site: Secondary | ICD-10-CM

## 2021-09-02 DIAGNOSIS — S72002S Fracture of unspecified part of neck of left femur, sequela: Secondary | ICD-10-CM

## 2021-09-02 MED ORDER — OXYCODONE HCL 10 MG PO TABS: 10.0000 mg | ORAL_TABLET | Freq: Three times a day (TID) | ORAL | 0 refills | Status: DC | PRN

## 2021-09-02 NOTE — Telephone Encounter (Signed)
Refill request for Oxycodone. Pharmacy [facility] in [place].

## 2021-09-02 NOTE — Telephone Encounter (Signed)
Rx sent 

## 2021-09-21 ENCOUNTER — Ambulatory Visit: Payer: Worker's Compensation | Admitting: Physical Medicine & Rehabilitation

## 2021-10-10 ENCOUNTER — Telehealth: Payer: Self-pay | Admitting: Physical Medicine & Rehabilitation

## 2021-10-10 DIAGNOSIS — M898X9 Other specified disorders of bone, unspecified site: Secondary | ICD-10-CM

## 2021-10-10 DIAGNOSIS — S72002S Fracture of unspecified part of neck of left femur, sequela: Secondary | ICD-10-CM

## 2021-10-10 DIAGNOSIS — G894 Chronic pain syndrome: Secondary | ICD-10-CM

## 2021-10-10 NOTE — Telephone Encounter (Signed)
Patient needs a refill on oxycodone.

## 2021-10-11 MED ORDER — OXYCODONE HCL 10 MG PO TABS: 10.0000 mg | ORAL_TABLET | Freq: Three times a day (TID) | ORAL | 0 refills | Status: DC | PRN

## 2021-10-11 NOTE — Telephone Encounter (Signed)
Rx sent in

## 2021-10-11 NOTE — Telephone Encounter (Signed)
Notified. 

## 2021-11-04 ENCOUNTER — Telehealth: Payer: Self-pay

## 2021-11-04 DIAGNOSIS — G894 Chronic pain syndrome: Secondary | ICD-10-CM

## 2021-11-04 DIAGNOSIS — S72002S Fracture of unspecified part of neck of left femur, sequela: Secondary | ICD-10-CM

## 2021-11-04 DIAGNOSIS — M898X9 Other specified disorders of bone, unspecified site: Secondary | ICD-10-CM

## 2021-11-04 MED ORDER — OXYCODONE HCL 10 MG PO TABS: 10.0000 mg | ORAL_TABLET | Freq: Three times a day (TID) | ORAL | 0 refills | Status: DC | PRN

## 2021-11-04 NOTE — Telephone Encounter (Signed)
Patient's daughter April [name] called to request refill on Oxycodone. Last fill 10/12/21. [facility] pharmacy in [place], [place] is where it needs to be sent

## 2021-11-04 NOTE — Telephone Encounter (Signed)
Done

## 2021-11-04 NOTE — Addendum Note (Signed)
Addended by: Faith Rogue T on: 11/04/2021 05:38 PM   Modules accepted: Orders

## 2021-11-07 NOTE — Telephone Encounter (Signed)
Daughter notified 

## 2021-12-06 ENCOUNTER — Other Ambulatory Visit: Payer: Self-pay

## 2021-12-06 DIAGNOSIS — S72002S Fracture of unspecified part of neck of left femur, sequela: Secondary | ICD-10-CM

## 2021-12-06 DIAGNOSIS — G894 Chronic pain syndrome: Secondary | ICD-10-CM

## 2021-12-06 DIAGNOSIS — M898X9 Other specified disorders of bone, unspecified site: Secondary | ICD-10-CM

## 2021-12-06 NOTE — Telephone Encounter (Signed)
MEDICATION: Oxycodone    LAST APPOINTMENT DATE: @11 /10/2021  NEXT APPOINTMENT DATE:@2 /07/2022  PMP REPORT:  Filled  Drug  QTY  Days  Prescriber  Dispenser  PMP   11/05/2021  Oxycodone Hcl (Ir) 10 Mg Tab 60.00 [address] [facility] ([id number]) [place]

## 2021-12-08 MED ORDER — OXYCODONE HCL 10 MG PO TABS: 10.0000 mg | ORAL_TABLET | Freq: Three times a day (TID) | ORAL | 0 refills | Status: DC | PRN

## 2022-02-01 ENCOUNTER — Encounter: Payer: Self-pay | Admitting: Physical Medicine & Rehabilitation

## 2022-02-01 ENCOUNTER — Encounter
Payer: Worker's Compensation | Attending: Physical Medicine & Rehabilitation | Admitting: Physical Medicine & Rehabilitation

## 2022-02-01 ENCOUNTER — Other Ambulatory Visit: Payer: Self-pay

## 2022-02-01 VITALS — BP 178/74 | HR 62 | Temp 98.1°F | Ht 68.0 in | Wt 166.2 lb

## 2022-02-01 DIAGNOSIS — S72002S Fracture of unspecified part of neck of left femur, sequela: Secondary | ICD-10-CM | POA: Insufficient documentation

## 2022-02-01 DIAGNOSIS — M5416 Radiculopathy, lumbar region: Secondary | ICD-10-CM | POA: Diagnosis not present

## 2022-02-01 DIAGNOSIS — M7501 Adhesive capsulitis of right shoulder: Secondary | ICD-10-CM | POA: Insufficient documentation

## 2022-02-01 DIAGNOSIS — G894 Chronic pain syndrome: Secondary | ICD-10-CM | POA: Insufficient documentation

## 2022-02-01 DIAGNOSIS — M898X9 Other specified disorders of bone, unspecified site: Secondary | ICD-10-CM | POA: Insufficient documentation

## 2022-02-01 MED ORDER — OXYCODONE HCL 10 MG PO TABS: 10.0000 mg | ORAL_TABLET | Freq: Three times a day (TID) | ORAL | 0 refills | Status: DC | PRN

## 2022-02-01 NOTE — Progress Notes (Signed)
Subjective:    Patient ID: April [name], female    DOB: 01-23-1925, 88 y.o.   MRN: [id number]  HPI  April [name] is here in follow up of her chronic pain syndrome. She deals with regular left hip pain as well as pain in her right shoulder. She admits to not keeping up with her exercises like she should.  Her balance and strength really has not improved as much as she had hoped.  She did some therapy up in [place] which she found helpful but is hoping to get some more.  She remains on oxycodone for her pain control which proves beneficial for her.  Using a rolling walker for balance.  She denies any falls or mishaps.  Reports no other changes in her medical status.  He does feel that she is developing another UTI.   Pain Inventory Average Pain 5 Pain Right Now 3 My pain is constant, stabbing, and aching  In the last 24 hours, has pain interfered with the following? General activity 0 Relation with others 0 Enjoyment of life 0 What TIME of day is your pain at its worst? evening Sleep (in general) Fair  Pain is worse with: unsure Pain improves with: medication Relief from Meds: 10  No family history on file. Social History   Socioeconomic History   Marital status: Single    Spouse name: Not on file   Number of children: Not on file   Years of education: Not on file   Highest education level: Not on file  Occupational History   Not on file  Tobacco Use   Smoking status: Never   Smokeless tobacco: Never  Vaping Use   Vaping Use: Never used  Substance and Sexual Activity   Alcohol use: No   Drug use: No   Sexual activity: Never    Birth control/protection: Post-menopausal  Other Topics Concern   Not on file  Social History Narrative   Not on file   Social Determinants of Health   Financial Resource Strain: Not on file  Food Insecurity: Not on file  Transportation Needs: Not on file  Physical Activity: Not on file  Stress: Not on file  Social Connections:  Not on file   Past Surgical History:  Procedure Laterality Date   ADENOIDECTOMY     CHOLECYSTECTOMY     EYE SURGERY     HIP ARTHROPLASTY Left 04/08/2014   Procedure: LEFT HIP HEMIARTHROPLASTY;  Surgeon: Verlee Rossetti, MD;  Location: [facility] OR;  Service: Orthopedics;  Laterality: Left;   left cataract removed     TONSILLECTOMY     Past Surgical History:  Procedure Laterality Date   ADENOIDECTOMY     CHOLECYSTECTOMY     EYE SURGERY     HIP ARTHROPLASTY Left 04/08/2014   Procedure: LEFT HIP HEMIARTHROPLASTY;  Surgeon: Verlee Rossetti, MD;  Location: [facility] OR;  Service: Orthopedics;  Laterality: Left;   left cataract removed     TONSILLECTOMY     Past Medical History:  Diagnosis Date   Cataract    left eye   Chronic back pain    stenosis   Constipation    takes an OTC stool softener    Diabetes mellitus without complication (HCC)    takes Lantus daily   GERD (gastroesophageal reflux disease)    not on Protonix daily   History of bronchitis 20 yrs ago   History of lupus 70yrs ago   History of shingles    HTN (hypertension)  04/07/2014   Hyperlipidemia 04/07/2014   takes Lovastatin daily   Hypertension    takes Amlodipine and Atenolol daily   Hypothyroidism 04/07/2014   takes Synthroid daily   Joint pain    Nocturia    OA (osteoarthritis) 04/07/2014   Peripheral edema    Urinary frequency    takes Vesicare daily   UTI (lower urinary tract infection)    completed antibioitcs on 07/28/14   BP (!) 178/74    Pulse 62    Temp 98.1 F (36.7 C)    Ht 5\' 8"  (1.727 m)    Wt 166 lb 3.2 oz (75.4 kg)    SpO2 96%    BMI 25.27 kg/m   Opioid Risk Score:   Fall Risk Score:  `1  Depression screen PHQ 2/9  Depression screen [facility] 2/9 02/01/2022 07/27/2021 01/19/2021 07/10/2018 11/19/2017 05/30/2016 03/06/2016  Decreased Interest 0 0 0 1 0 0 1  Down, Depressed, Hopeless 0 0 0 1 0 0 0  PHQ - 2 Score 0 0 0 2 0 0 1  Altered sleeping - - - - - - -  Tired, decreased energy - - - - - - -  Change in  appetite - - - - - - -  Feeling bad or failure about yourself  - - - - - - -  Trouble concentrating - - - - - - -  Moving slowly or fidgety/restless - - - - - - -  Suicidal thoughts - - - - - - -  PHQ-9 Score - - - - - - -      Review of Systems  Constitutional: Negative.   HENT: Negative.    Eyes: Negative.   Respiratory: Negative.    Cardiovascular: Negative.   Gastrointestinal: Negative.   Endocrine: Negative.   Genitourinary:        Thinks she has a UTI  Musculoskeletal:  Positive for arthralgias and gait problem.  Skin: Negative.   Allergic/Immunologic: Negative.   Hematological: Negative.   Psychiatric/Behavioral: Negative.    All other systems reviewed and are negative.     Objective:   Physical Exam   General: No acute distress HEENT: NCAT, EOMI, oral membranes moist Cards: reg rate  Chest: normal effort Abdomen: Soft, NT, ND Skin: dry, intact Extremities: no edema Psych: pleasant and appropriate    Musculoskeletal: left hip/troch tender with palp. Antalgic RLE with gait. Right shoulder limited to about 80 abduction. TP right mid trap, lev scalp perhaps too. Neuro:  motor generally 4-5/5 UE, 3-4/5 Left HF, HE,  distal sensory loss--no change. Cognitively at baseline     Assessment/Plan: 1. Left Lumbar 3 radiculopathy/stenosis s/p L3-4 laminectomy and fusion. With residual radiculopathy in the left lower extremity perhaps involving L3 and L4 2. Diabetes with peripheral neuropathy. 3. Heterotopic bone--left hip.  4. Adhesive capsulitis right shoulder with associated myofascial pain in trap       Plan:   1. April [name] has lost some of her strength in the LE and has further impairment in her balance. She still hasn't quite gotten back to her baseline and has had a little more diffiulty with balance and her right shoulder ROM  -Made a new referral to [facility], for LLE strength and balance. Would also like them to work on right shoulder  ROM as well if they can.. 2. Oxycodone for breakthrough pain 10mg  #60. RF today We will continue the controlled substance monitoring program, this consists of regular clinic visits,  examinations, routine drug screening, pill counts as well as use of [place] Controlled Substance Reporting System. [place].     3.  Neuropathic pain LLE is still present 4. Bladder rx per primary--needs UA/UCX b/f abx prescribed 5. Ibuprofen prn for pain with food, rare tylenol. --Continue  6. Lidoderm/voltaren are both over the counter now--continue prn   15 min of face to face patient care time were spent during this visit. All questions were encouraged and answered.  Follow up with me in 6 mos .

## 2022-02-01 NOTE — Patient Instructions (Signed)
PLEASE FEEL FREE TO CALL OUR OFFICE WITH ANY PROBLEMS OR QUESTIONS ([phone])      

## 2022-02-22 ENCOUNTER — Telehealth: Payer: Self-pay

## 2022-02-22 DIAGNOSIS — M898X9 Other specified disorders of bone, unspecified site: Secondary | ICD-10-CM

## 2022-02-22 DIAGNOSIS — S72002S Fracture of unspecified part of neck of left femur, sequela: Secondary | ICD-10-CM

## 2022-02-22 DIAGNOSIS — G894 Chronic pain syndrome: Secondary | ICD-10-CM

## 2022-02-22 MED ORDER — OXYCODONE HCL 10 MG PO TABS: 10.0000 mg | ORAL_TABLET | Freq: Three times a day (TID) | ORAL | 0 refills | Status: DC | PRN

## 2022-02-22 NOTE — Telephone Encounter (Signed)
I filled this rx. However, it appears that her usage is increasing quite a bit. We'll need to discuss if this trend continues.  ?

## 2022-02-22 NOTE — Telephone Encounter (Signed)
Patient daughter called requesting refill on Oxycodone for patient #60(20 day supply) filled on 02/03/2022 ?[facility] ?

## 2022-03-13 ENCOUNTER — Telehealth: Payer: Self-pay | Admitting: *Deleted

## 2022-03-13 DIAGNOSIS — M898X9 Other specified disorders of bone, unspecified site: Secondary | ICD-10-CM

## 2022-03-13 DIAGNOSIS — G894 Chronic pain syndrome: Secondary | ICD-10-CM

## 2022-03-13 DIAGNOSIS — S72002S Fracture of unspecified part of neck of left femur, sequela: Secondary | ICD-10-CM

## 2022-03-13 NOTE — Telephone Encounter (Signed)
I left a message for the daughter that it is too soon to refill. It will be due 03/24/22. ?

## 2022-03-13 NOTE — Telephone Encounter (Signed)
I spoke with April [name] daughter and she is due for refill on 03/24/22 and she is not out but has about a week left.  They were just calling for the reminder to fill. ?

## 2022-03-13 NOTE — Telephone Encounter (Signed)
Ottis's daughter called for a refill on Mirenda's oxycodone. Per PMP it was last filled was 02/23/22. ?

## 2022-03-14 MED ORDER — OXYCODONE HCL 10 MG PO TABS: 10.0000 mg | ORAL_TABLET | Freq: Three times a day (TID) | ORAL | 0 refills | Status: DC | PRN

## 2022-03-14 NOTE — Telephone Encounter (Signed)
RX filled.

## 2022-03-14 NOTE — Addendum Note (Signed)
Addended by: Faith Rogue T on: 03/14/2022 01:02 PM ? ? Modules accepted: Orders ? ?

## 2022-03-23 ENCOUNTER — Telehealth: Payer: Self-pay | Admitting: *Deleted

## 2022-03-23 NOTE — Telephone Encounter (Signed)
Mrs Randol daughter called and says that she had her eval done at the physical therapy dept but they said she is going to need another rx for more visits. She said the therapist will fax the report of the evaluation. ?

## 2022-05-04 ENCOUNTER — Telehealth: Payer: Self-pay

## 2022-05-04 DIAGNOSIS — M898X9 Other specified disorders of bone, unspecified site: Secondary | ICD-10-CM

## 2022-05-04 DIAGNOSIS — S72002S Fracture of unspecified part of neck of left femur, sequela: Secondary | ICD-10-CM

## 2022-05-04 DIAGNOSIS — G894 Chronic pain syndrome: Secondary | ICD-10-CM

## 2022-05-04 NOTE — Telephone Encounter (Signed)
Patient's daughter called to get a refill on Oxycodone 10 mg sent to [facility] in [place], [place]. Per PMP, last fill was 03/23/2022 ?

## 2022-05-05 MED ORDER — OXYCODONE HCL 10 MG PO TABS: 10.0000 mg | ORAL_TABLET | Freq: Three times a day (TID) | ORAL | 0 refills | Status: DC | PRN

## 2022-05-05 NOTE — Telephone Encounter (Signed)
Rx sent 

## 2022-05-05 NOTE — Addendum Note (Signed)
Addended by: Faith Rogue T on: 05/05/2022 11:15 AM ? ? Modules accepted: Orders ? ?

## 2022-06-05 ENCOUNTER — Telehealth: Payer: Self-pay

## 2022-06-05 DIAGNOSIS — G894 Chronic pain syndrome: Secondary | ICD-10-CM

## 2022-06-05 DIAGNOSIS — M5416 Radiculopathy, lumbar region: Secondary | ICD-10-CM

## 2022-06-05 DIAGNOSIS — M898X9 Other specified disorders of bone, unspecified site: Secondary | ICD-10-CM

## 2022-06-05 DIAGNOSIS — S72002S Fracture of unspecified part of neck of left femur, sequela: Secondary | ICD-10-CM

## 2022-06-05 NOTE — Telephone Encounter (Signed)
Rosemary from [facility] states they need more visits approved. Order put in and sent to [facility] to get approved.

## 2022-06-08 ENCOUNTER — Telehealth: Payer: Self-pay | Admitting: *Deleted

## 2022-06-08 DIAGNOSIS — M5416 Radiculopathy, lumbar region: Secondary | ICD-10-CM

## 2022-06-08 DIAGNOSIS — G894 Chronic pain syndrome: Secondary | ICD-10-CM

## 2022-06-08 DIAGNOSIS — M898X9 Other specified disorders of bone, unspecified site: Secondary | ICD-10-CM

## 2022-06-08 DIAGNOSIS — S72002S Fracture of unspecified part of neck of left femur, sequela: Secondary | ICD-10-CM

## 2022-06-08 MED ORDER — OXYCODONE HCL 10 MG PO TABS: 10.0000 mg | ORAL_TABLET | Freq: Three times a day (TID) | ORAL | 0 refills | Status: DC | PRN

## 2022-06-08 NOTE — Telephone Encounter (Signed)
[name] daughter called for a refill on April [name]'s oxycodone (last filled 05/05/22) and she reports that a new referral needs to be placed for her physical therapy to [facility]. She has completed her visits off last referral but has more needs they want to address.

## 2022-06-08 NOTE — Telephone Encounter (Signed)
Therapy referral made. Rx sent to pharm

## 2022-06-29 ENCOUNTER — Telehealth: Payer: Self-pay

## 2022-06-29 DIAGNOSIS — S72002S Fracture of unspecified part of neck of left femur, sequela: Secondary | ICD-10-CM

## 2022-06-29 DIAGNOSIS — G894 Chronic pain syndrome: Secondary | ICD-10-CM

## 2022-06-29 DIAGNOSIS — M898X9 Other specified disorders of bone, unspecified site: Secondary | ICD-10-CM

## 2022-06-29 NOTE — Telephone Encounter (Signed)
PMP REPORT:  Filled  Written  ID  Drug  QTY  Days  Prescriber  RX #  Dispenser  Refill  Daily Dose*  Pymt Type  PMP  06/08/2022 06/08/2022 1  Oxycodone Hcl (Ir) 10 Mg Tab 60.00 20 Za Swa [id number] [facility] (9123) 0/0 45.00 [facility] [place] 05/05/2022 05/05/2022 1  Oxycodone Hcl (Ir) 10 Mg Tab 60.00 20 Za Swa [id number] [facility] (9123) 0/0 45.00 [facility] [place]

## 2022-06-30 MED ORDER — OXYCODONE HCL 10 MG PO TABS: 10.0000 mg | ORAL_TABLET | Freq: Three times a day (TID) | ORAL | 0 refills | Status: DC | PRN

## 2022-06-30 NOTE — Telephone Encounter (Signed)
Rf sent.  thanks

## 2022-07-12 ENCOUNTER — Encounter: Payer: Worker's Compensation | Admitting: Physical Medicine & Rehabilitation

## 2022-07-18 ENCOUNTER — Telehealth: Payer: Self-pay | Admitting: *Deleted

## 2022-07-18 DIAGNOSIS — M898X9 Other specified disorders of bone, unspecified site: Secondary | ICD-10-CM

## 2022-07-18 DIAGNOSIS — S72002S Fracture of unspecified part of neck of left femur, sequela: Secondary | ICD-10-CM

## 2022-07-18 DIAGNOSIS — G894 Chronic pain syndrome: Secondary | ICD-10-CM

## 2022-07-18 MED ORDER — OXYCODONE HCL 10 MG PO TABS: 10.0000 mg | ORAL_TABLET | Freq: Three times a day (TID) | ORAL | 0 refills | Status: DC | PRN

## 2022-07-18 NOTE — Telephone Encounter (Signed)
Rx rf'ed for 8/2

## 2022-07-18 NOTE — Telephone Encounter (Signed)
April [name] requests a refill on her oxycodone. It is not due until 07/29/22. (Last fill date 06/30/22)

## 2022-08-02 ENCOUNTER — Encounter: Payer: Self-pay | Admitting: Physical Medicine & Rehabilitation

## 2022-08-02 ENCOUNTER — Encounter
Payer: Worker's Compensation | Attending: Physical Medicine & Rehabilitation | Admitting: Physical Medicine & Rehabilitation

## 2022-08-02 ENCOUNTER — Ambulatory Visit: Payer: Medicare HMO | Admitting: Physical Medicine & Rehabilitation

## 2022-08-02 VITALS — BP 149/68 | HR 60 | Ht 68.0 in | Wt 166.2 lb

## 2022-08-02 DIAGNOSIS — S72002S Fracture of unspecified part of neck of left femur, sequela: Secondary | ICD-10-CM | POA: Diagnosis present

## 2022-08-02 DIAGNOSIS — Z5181 Encounter for therapeutic drug level monitoring: Secondary | ICD-10-CM | POA: Diagnosis not present

## 2022-08-02 DIAGNOSIS — Z79891 Long term (current) use of opiate analgesic: Secondary | ICD-10-CM | POA: Diagnosis not present

## 2022-08-02 DIAGNOSIS — M898X9 Other specified disorders of bone, unspecified site: Secondary | ICD-10-CM | POA: Diagnosis not present

## 2022-08-02 DIAGNOSIS — G894 Chronic pain syndrome: Secondary | ICD-10-CM | POA: Insufficient documentation

## 2022-08-02 MED ORDER — OXYCODONE HCL 10 MG PO TABS: 10.0000 mg | ORAL_TABLET | Freq: Three times a day (TID) | ORAL | 0 refills | Status: DC | PRN

## 2022-08-02 NOTE — Progress Notes (Signed)
Subjective:    Patient ID: April [name], female    DOB: [birth year], 88 y.o.   MRN: [id number]  HPI April [name] is here in follow-up of her chronic pain and gait syndrome.  She is having more pain in her left hip and low back since we last met several months ago.  She was never granted physical therapy as I prescribed at her last visit and she has become more more sedentary and has had more pain as result.  She is using oxycodone sometimes more than twice a day for pain relief due to the pain.  She feels that her left hip and leg is weaker as well.  She is not using anything topically currently for the leg.  She does use her walker for balance and has not had any falls or mishaps fortunately.  Her daughter continues to provide assistance at home with everyday activities of living etc.    Pain Inventory Average Pain 6 Pain Right Now 6 My pain is sharp  In the last 24 hours, has pain interfered with the following? General activity 5 Relation with others 5 Enjoyment of life 5 What TIME of day is your pain at its worst? morning  and daytime Sleep (in general) Fair  Pain is worse with: walking and standing Pain improves with: rest and medication Relief from Meds: 6  History reviewed. No pertinent family history. Social History   Socioeconomic History   Marital status: Single    Spouse name: Not on file   Number of children: Not on file   Years of education: Not on file   Highest education level: Not on file  Occupational History   Not on file  Tobacco Use   Smoking status: Never   Smokeless tobacco: Never  Vaping Use   Vaping Use: Never used  Substance and Sexual Activity   Alcohol use: No   Drug use: No   Sexual activity: Never    Birth control/protection: Post-menopausal  Other Topics Concern   Not on file  Social History Narrative   Not on file   Social Determinants of Health   Financial Resource Strain: Not on file  Food Insecurity: Not on file   Transportation Needs: Not on file  Physical Activity: Not on file  Stress: Not on file  Social Connections: Not on file   Past Surgical History:  Procedure Laterality Date   ADENOIDECTOMY     CHOLECYSTECTOMY     EYE SURGERY     HIP ARTHROPLASTY Left 04/08/2014   Procedure: LEFT HIP HEMIARTHROPLASTY;  Surgeon: Augustin Schooling, MD;  Location: [place];  Service: Orthopedics;  Laterality: Left;   left cataract removed     TONSILLECTOMY     Past Surgical History:  Procedure Laterality Date   ADENOIDECTOMY     CHOLECYSTECTOMY     EYE SURGERY     HIP ARTHROPLASTY Left 04/08/2014   Procedure: LEFT HIP HEMIARTHROPLASTY;  Surgeon: Augustin Schooling, MD;  Location: [place];  Service: Orthopedics;  Laterality: Left;   left cataract removed     TONSILLECTOMY     Past Medical History:  Diagnosis Date   Cataract    left eye   Chronic back pain    stenosis   Constipation    takes an OTC stool softener    Diabetes mellitus without complication (HCC)    takes Lantus daily   GERD (gastroesophageal reflux disease)    not on Protonix daily   History of bronchitis  20 yrs ago   History of lupus 3yr ago   History of shingles    HTN (hypertension) 04/07/2014   Hyperlipidemia 04/07/2014   takes Lovastatin daily   Hypertension    takes Amlodipine and Atenolol daily   Hypothyroidism 04/07/2014   takes Synthroid daily   Joint pain    Nocturia    OA (osteoarthritis) 04/07/2014   Peripheral edema    Urinary frequency    takes Vesicare daily   UTI (lower urinary tract infection)    completed antibioitcs on 07/28/14   BP (!) 149/68   Pulse 60   Ht _0  (1.727 m)   Wt 166 lb 3.2 oz (75.4 kg)   SpO2 94%   BMI 25.27 kg/m   Opioid Risk Score:   Fall Risk Score:  `1  [facility]     08/02/2022    9:26 AM 02/01/2022   11:35 AM 07/27/2021   11:06 AM 01/19/2021   10:41 AM 07/10/2018   10:26 AM 11/19/2017    9:59 AM 05/30/2016   11:19 AM  Depression screen PHQ 2/9  Decreased Interest  0 0 0 0 1 0 0  Down, Depressed, Hopeless 0 0 0 0 1 0 0  PHQ - 2 Score 0 0 0 0 2 0 0     Review of Systems  Constitutional: Negative.   HENT: Negative.    Eyes: Negative.   Respiratory: Negative.    Cardiovascular: Negative.   Gastrointestinal: Negative.   Endocrine: Negative.   Genitourinary: Negative.   Musculoskeletal:  Positive for gait problem.  Skin: Negative.   Allergic/Immunologic: Negative.   Hematological: Negative.   Psychiatric/Behavioral: Negative.        Objective:   Physical Exam   General: No acute distress HEENT: NCAT, EOMI, oral membranes moist Cards: reg rate  Chest: normal effort Abdomen: Soft, NT, ND Skin: dry, intact Extremities: no edema Psych: pleasant and appropriate    Musculoskeletal: left hip/troch still tenderwith palp. Antalgic LLE with gait. Right shoulder limited to about 80 abduction still. Appears generally safe with walker with ambulation despite favoring left leg Neuro:  motor generally 4-5/5 UE, 3-4/5 Left HF, HE,  distal sensory loss--no change. Cognitively at baseline     Assessment/Plan: 1. Left Lumbar 3 radiculopathy/stenosis s/p L3-4 laminectomy and fusion. With residual radiculopathy in the left lower extremity perhaps involving L3 and L4 2. Diabetes with peripheral neuropathy. 3. Heterotopic bone--left hip.  4. Adhesive capsulitis right shoulder with associated myofascial pain in trap       Plan:   1. Mrs MMuffleyhas lost some of her strength in the LE and has further impairment in her balance. She still hasn't  gotten back to her baseline and has had a little more diffiulty with balance and her right shoulder ROM. She has not received any further therapy as I prescribed.  She is unable to work at this point and will remain unable to return to work long-term given her deficits. 2. Oxycodone for breakthrough pain 128mQ8 PRN, increase to #70 We will continue the controlled substance monitoring program, this consists of regular  clinic visits, examinations, routine drug screening, pill [place].   -UDS TODAY 3. Neuropathic pain LLE is still present 4. A letter was written for lawyer about current status and needs 5. Ibuprofen prn for pain with food, rare tylenol. --Continue  6. Lidoderm/voltaren are both over the  counter now--encouraged use of lidocaine patch for left hip   30 min of face to face patient care time were spent during this visit. All questions were encouraged and answered.  Follow up with me in 6 mos .

## 2022-08-02 NOTE — Patient Instructions (Addendum)
PLEASE FEEL FREE TO CALL OUR OFFICE WITH ANY PROBLEMS OR QUESTIONS [phone])   ICY HOT, SALONPAS (WITH LIDOCAINE)

## 2022-08-05 LAB — TOXASSURE SELECT,+ANTIDEPR,UR

## 2022-08-07 ENCOUNTER — Telehealth: Payer: Self-pay | Admitting: *Deleted

## 2022-08-07 NOTE — Telephone Encounter (Signed)
Urine drug screen for this encounter is consistent for prescribed medication 

## 2022-09-11 ENCOUNTER — Other Ambulatory Visit: Payer: Self-pay

## 2022-09-11 DIAGNOSIS — M898X9 Other specified disorders of bone, unspecified site: Secondary | ICD-10-CM

## 2022-09-11 DIAGNOSIS — G894 Chronic pain syndrome: Secondary | ICD-10-CM

## 2022-09-11 DIAGNOSIS — S72002S Fracture of unspecified part of neck of left femur, sequela: Secondary | ICD-10-CM

## 2022-09-11 NOTE — Telephone Encounter (Signed)
Refill request for Oxycodone.  

## 2022-09-12 ENCOUNTER — Other Ambulatory Visit: Payer: Self-pay

## 2022-09-13 NOTE — Telephone Encounter (Signed)
According to PMP #70(23 day supply). Friday would be the 23rd day

## 2022-09-19 MED ORDER — OXYCODONE HCL 10 MG PO TABS: 10.0000 mg | ORAL_TABLET | Freq: Three times a day (TID) | ORAL | 0 refills | Status: DC | PRN

## 2022-10-18 ENCOUNTER — Ambulatory Visit: Payer: Self-pay | Admitting: Physical Medicine & Rehabilitation

## 2022-10-20 ENCOUNTER — Telehealth: Payer: Self-pay

## 2022-10-20 DIAGNOSIS — M898X9 Other specified disorders of bone, unspecified site: Secondary | ICD-10-CM

## 2022-10-20 DIAGNOSIS — S72002S Fracture of unspecified part of neck of left femur, sequela: Secondary | ICD-10-CM

## 2022-10-20 DIAGNOSIS — G894 Chronic pain syndrome: Secondary | ICD-10-CM

## 2022-10-20 MED ORDER — OXYCODONE HCL 10 MG PO TABS: 10.0000 mg | ORAL_TABLET | Freq: Three times a day (TID) | ORAL | 0 refills | Status: DC | PRN

## 2022-10-20 NOTE — Telephone Encounter (Signed)
PMP was Reviewed Dr Naaman Plummer note was reviewed.  Call Placed to Ms. Mccubbins, daughter answered. She realizes the oxycodone was prescribed today and verbalizes understanding.

## 2022-11-22 ENCOUNTER — Other Ambulatory Visit: Payer: Self-pay

## 2022-11-22 DIAGNOSIS — G894 Chronic pain syndrome: Secondary | ICD-10-CM

## 2022-11-22 DIAGNOSIS — S72002S Fracture of unspecified part of neck of left femur, sequela: Secondary | ICD-10-CM

## 2022-11-22 DIAGNOSIS — M898X9 Other specified disorders of bone, unspecified site: Secondary | ICD-10-CM

## 2022-11-22 MED ORDER — OXYCODONE HCL 10 MG PO TABS: 10.0000 mg | ORAL_TABLET | Freq: Three times a day (TID) | ORAL | 0 refills | Status: DC | PRN

## 2022-11-22 NOTE — Telephone Encounter (Signed)
Please refill Oxycodone 10 MG and send to [facility] in [place].  Filled  Written  ID  Drug  QTY  Days  Prescriber  RX #  Dispenser  Refill  Daily Dose*  Pymt Type  PMP  10/20/2022 10/20/2022 1  Oxycodone Hcl (Ir) 10 Mg Tab 70.00 23 Eu Tho [id number] [facility] (9123) 0/0 45.65 [facility] 09/19/2022 09/19/2022 1  Oxycodone Hcl (Ir) 10 Mg Tab 70.00 23 Za Swa [id number] [facility] (9123) 0/0 45.65 [facility] [place]

## 2022-12-13 ENCOUNTER — Other Ambulatory Visit: Payer: Self-pay

## 2022-12-13 DIAGNOSIS — S72002S Fracture of unspecified part of neck of left femur, sequela: Secondary | ICD-10-CM

## 2022-12-13 DIAGNOSIS — M898X9 Other specified disorders of bone, unspecified site: Secondary | ICD-10-CM

## 2022-12-13 DIAGNOSIS — G894 Chronic pain syndrome: Secondary | ICD-10-CM

## 2022-12-13 MED ORDER — OXYCODONE HCL 10 MG PO TABS: 10.0000 mg | ORAL_TABLET | Freq: Three times a day (TID) | ORAL | 0 refills | Status: DC | PRN

## 2022-12-13 NOTE — Telephone Encounter (Signed)
Daughter Jasmine December called requesting refill for Oxycodone to be sent to [facility] in [place], [place] Last filled 11/22/22

## 2023-01-15 ENCOUNTER — Other Ambulatory Visit: Payer: Self-pay

## 2023-01-15 DIAGNOSIS — S72002S Fracture of unspecified part of neck of left femur, sequela: Secondary | ICD-10-CM

## 2023-01-15 DIAGNOSIS — M898X9 Other specified disorders of bone, unspecified site: Secondary | ICD-10-CM

## 2023-01-15 DIAGNOSIS — G894 Chronic pain syndrome: Secondary | ICD-10-CM

## 2023-01-15 MED ORDER — OXYCODONE HCL 10 MG PO TABS: 10.0000 mg | ORAL_TABLET | Freq: Three times a day (TID) | ORAL | 0 refills | Status: DC | PRN

## 2023-02-07 ENCOUNTER — Encounter: Payer: Worker's Compensation | Admitting: Physical Medicine & Rehabilitation

## 2023-02-14 ENCOUNTER — Encounter: Payer: Self-pay | Admitting: Physical Medicine & Rehabilitation

## 2023-02-14 ENCOUNTER — Encounter
Payer: Worker's Compensation | Attending: Physical Medicine & Rehabilitation | Admitting: Physical Medicine & Rehabilitation

## 2023-02-14 VITALS — BP 141/65 | HR 69 | Ht 68.0 in | Wt 168.0 lb

## 2023-02-14 DIAGNOSIS — G894 Chronic pain syndrome: Secondary | ICD-10-CM

## 2023-02-14 DIAGNOSIS — M7918 Myalgia, other site: Secondary | ICD-10-CM | POA: Insufficient documentation

## 2023-02-14 DIAGNOSIS — S72002A Fracture of unspecified part of neck of left femur, initial encounter for closed fracture: Secondary | ICD-10-CM | POA: Insufficient documentation

## 2023-02-14 DIAGNOSIS — S72002S Fracture of unspecified part of neck of left femur, sequela: Secondary | ICD-10-CM | POA: Insufficient documentation

## 2023-02-14 DIAGNOSIS — M898X9 Other specified disorders of bone, unspecified site: Secondary | ICD-10-CM | POA: Diagnosis present

## 2023-02-14 DIAGNOSIS — Z79891 Long term (current) use of opiate analgesic: Secondary | ICD-10-CM | POA: Insufficient documentation

## 2023-02-14 DIAGNOSIS — Z5181 Encounter for therapeutic drug level monitoring: Secondary | ICD-10-CM | POA: Insufficient documentation

## 2023-02-14 MED ORDER — OXYCODONE HCL 10 MG PO TABS: 10.0000 mg | ORAL_TABLET | Freq: Three times a day (TID) | ORAL | 0 refills | Status: DC | PRN

## 2023-02-14 NOTE — Progress Notes (Signed)
Subjective:    Patient ID: Theo Dills, female    DOB: November 20, 1925, 89 y.o.   MRN: [id number]  HPI  Mrs Bieber is here in follow up of her chronic pain. Her pain in the low back and left hip is steady.  She is not moving around as much as she used to.  Some of that stems from the fact that her daughter has some pulmonary issues that restrict her.  She stays at home more and is more sedentary.  She remains on oxycodone for her back and left hip pain.  She uses 10 mg twice daily on most days and occasionally a third dose on days when pain is more severe.  She reports that her bowel function is regular, sometimes 2 regular.  She also has urinary frequency which she takes Myrbetriq for.  She follows up with her primary regarding this next month.  She denies any falls or mishaps at home and really does not want any changes in her regimen today.  Pain Inventory Average Pain 8 Pain Right Now 6 My pain is sharp and stabbing  In the last 24 hours, has pain interfered with the following? General activity 6 Relation with others 6 Enjoyment of life 9 What TIME of day is your pain at its worst? evening Sleep (in general) Poor  Pain is worse with: walking, bending, standing, and some activites Pain improves with: rest, heat/ice, and medication Relief from Meds: 4  walk with assistance use a walker ability to climb steps?  no do you drive?  no transfers alone  retired  bladder control problems  Any changes since last visit?  no  Any changes since last visit?  no    No family history on file. Social History   Socioeconomic History   Marital status: Single    Spouse name: Not on file   Number of children: Not on file   Years of education: Not on file   Highest education level: Not on file  Occupational History   Not on file  Tobacco Use   Smoking status: Never   Smokeless tobacco: Never  Vaping Use   Vaping Use: Never used  Substance and Sexual Activity   Alcohol  use: No   Drug use: No   Sexual activity: Never    Birth control/protection: Post-menopausal  Other Topics Concern   Not on file  Social History Narrative   Not on file   Social Determinants of Health   Financial Resource Strain: Not on file  Food Insecurity: Not on file  Transportation Needs: Not on file  Physical Activity: Not on file  Stress: Not on file  Social Connections: Not on file   Past Surgical History:  Procedure Laterality Date   ADENOIDECTOMY     CHOLECYSTECTOMY     EYE SURGERY     HIP ARTHROPLASTY Left 04/08/2014   Procedure: LEFT HIP HEMIARTHROPLASTY;  Surgeon: Augustin Schooling, MD;  Location: [place];  Service: Orthopedics;  Laterality: Left;   left cataract removed     TONSILLECTOMY     Past Medical History:  Diagnosis Date   Cataract    left eye   Chronic back pain    stenosis   Constipation    takes an OTC stool softener    Diabetes mellitus without complication (HCC)    takes Lantus daily   GERD (gastroesophageal reflux disease)    not on Protonix daily   History of bronchitis 20 yrs ago  History of lupus 20yr ago   History of shingles    HTN (hypertension) 04/07/2014   Hyperlipidemia 04/07/2014   takes Lovastatin daily   Hypertension    takes Amlodipine and Atenolol daily   Hypothyroidism 04/07/2014   takes Synthroid daily   Joint pain    Nocturia    OA (osteoarthritis) 04/07/2014   Peripheral edema    Urinary frequency    takes Vesicare daily   UTI (lower urinary tract infection)    completed antibioitcs on 07/28/14   BP (!) 141/65   Pulse 69   Ht 5' 8"$  (1.727 m)   Wt 168 lb (76.2 kg)   SpO2 94%   BMI 25.54 kg/m   Opioid Risk Score:   Fall Risk Score:  `1  [facility]     02/14/2023    2:31 PM 08/02/2022    9:26 AM 02/01/2022   11:35 AM 07/27/2021   11:06 AM 01/19/2021   10:41 AM 07/10/2018   10:26 AM 11/19/2017    9:59 AM  Depression screen PHQ 2/9  Decreased Interest 0 0 0 0 0 1 0  Down, Depressed, Hopeless 0 0 0  0 0 1 0  PHQ - 2 Score 0 0 0 0 0 2 0    Review of Systems  Musculoskeletal:  Positive for back pain.       Left arm       Objective:   Physical Exam General: No acute distress HEENT: NCAT, EOMI, oral membranes moist Cards: reg rate  Chest: normal effort Abdomen: Soft, NT, ND Skin: dry, intact Extremities: no edema Psych: pleasant and appropriate   Musculoskeletal: left hip/troch still tenderwith palp.  Mild antalgia left lower extremity although improved.  Right shoulder limited to about 80 abduction still. Appears generally safe with walker with ambulation despite favoring left leg Neuro:  motor generally 4-5/5 UE, 3-4/5 Left HF, HE, ongoing distal sensory loss in the left lower extremity.  Cognitively she is at baseline   Assessment/Plan: 1. Left Lumbar 3 radiculopathy/stenosis s/p L3-4 laminectomy and fusion. With residual radiculopathy in the left lower extremity perhaps involving L3 and L4 2. Diabetes with peripheral neuropathy. 3. Heterotopic bone--left hip.  4. Adhesive capsulitis right shoulder with associated myofascial pain in trap       Plan:   1.  Encouraged Mrs. MThrallto remain as active as she can.  I know there are some limitations.  She will continue with her rolling walker for balance.. 2. Oxycodone for breakthrough pain 152mQ8 PRN,  #70 We will continue the controlled substance monitoring program, this consists of regular clinic visits, examinations, routine drug screening, pill [place] well [place].   -I provided prescription for today as well as a second for next month 3. Neuropathic pain LLE is still present 4. A letter was written for lawyer about current status and needs 5. Ibuprofen prn for pain with food, rare tylenol. --Continue  6. Lidoderm/voltaren   over the counter now--encouraged use of lidocaine patch for left hip   20 min of face to face patient care time  were spent during this visit. All questions were encouraged and answered.  Follow up with me in 2 mos with me or nurse practitioner.

## 2023-02-14 NOTE — Patient Instructions (Signed)
ALWAYS FEEL FREE TO CALL OUR OFFICE WITH ANY PROBLEMS OR QUESTIONS ([phone])  **PLEASE NOTE** ALL MEDICATION REFILL REQUESTS (INCLUDING CONTROLLED SUBSTANCES) NEED TO BE MADE AT LEAST 7 DAYS PRIOR TO REFILL BEING DUE. ANY REFILL REQUESTS INSIDE THAT TIME FRAME MAY RESULT IN DELAYS IN RECEIVING YOUR PRESCRIPTION.                    

## 2023-02-18 LAB — DRUG TOX MONITOR 1 W/CONF, ORAL FLD

## 2023-02-18 LAB — DRUG TOX ALC METAB W/CON, ORAL FLD: Alcohol Metabolite: NEGATIVE ng/mL (ref <25)

## 2023-03-26 ENCOUNTER — Telehealth: Payer: Self-pay | Admitting: *Deleted

## 2023-03-26 NOTE — Telephone Encounter (Signed)
Patient calling re: Oxycodone rf Pt notified pharmacy contacted and refill is being filled and should be ready for pick up.

## 2023-04-11 ENCOUNTER — Encounter: Payer: Worker's Compensation | Admitting: Registered Nurse

## 2023-04-11 ENCOUNTER — Telehealth: Payer: Self-pay | Admitting: *Deleted

## 2023-04-11 NOTE — Telephone Encounter (Signed)
Oral swab is positive for opiates as expected but does not specify which one.

## 2023-04-16 ENCOUNTER — Encounter: Payer: Self-pay | Admitting: Registered Nurse

## 2023-04-16 ENCOUNTER — Encounter: Payer: Worker's Compensation | Attending: Physical Medicine & Rehabilitation | Admitting: Registered Nurse

## 2023-04-16 VITALS — BP 123/69 | HR 56 | Ht 68.0 in

## 2023-04-16 DIAGNOSIS — S72002S Fracture of unspecified part of neck of left femur, sequela: Secondary | ICD-10-CM | POA: Diagnosis present

## 2023-04-16 DIAGNOSIS — S72002A Fracture of unspecified part of neck of left femur, initial encounter for closed fracture: Secondary | ICD-10-CM | POA: Diagnosis present

## 2023-04-16 DIAGNOSIS — M898X9 Other specified disorders of bone, unspecified site: Secondary | ICD-10-CM | POA: Diagnosis present

## 2023-04-16 DIAGNOSIS — M5416 Radiculopathy, lumbar region: Secondary | ICD-10-CM | POA: Diagnosis not present

## 2023-04-16 DIAGNOSIS — G894 Chronic pain syndrome: Secondary | ICD-10-CM | POA: Diagnosis not present

## 2023-04-16 DIAGNOSIS — Z79891 Long term (current) use of opiate analgesic: Secondary | ICD-10-CM | POA: Insufficient documentation

## 2023-04-16 DIAGNOSIS — Z5181 Encounter for therapeutic drug level monitoring: Secondary | ICD-10-CM

## 2023-04-16 MED ORDER — OXYCODONE HCL 10 MG PO TABS
10.0000 mg | ORAL_TABLET | Freq: Three times a day (TID) | ORAL | 0 refills | Status: DC | PRN
Start: 2023-04-16 — End: 2023-04-16

## 2023-04-16 MED ORDER — OXYCODONE HCL 10 MG PO TABS
10.0000 mg | ORAL_TABLET | Freq: Three times a day (TID) | ORAL | 0 refills | Status: DC | PRN
Start: 2023-04-16 — End: 2023-06-21

## 2023-04-16 NOTE — Progress Notes (Signed)
Subjective:    Patient ID: April [name], female    DOB: 01-16-1925, 89 y.o.   MRN: [id number]  HPI: April [name] is a 89 y.o. female who returns for follow up appointment for chronic pain and medication refill. She states her pain is located in her lower back pain radiating into her left lower extremity and left hip pain. She rates her pain 8. Her current exercise regime is walking short distances in her home with walker and performing stretching exercises.  Ms. Fogelman Morphine equivalent is 45.65 MME.   Oral Swab was Performed today.   Pain Inventory Average Pain 0 Pain Right Now 8 My pain is intermittent, sharp, burning, dull, tingling, and aching  In the last 24 hours, has pain interfered with the following? General activity 7 Relation with others 2 Enjoyment of life 2 What TIME of day is your pain at its worst? daytime and evening Sleep (in general) Fair  Pain is worse with: standing and some activites Pain improves with: rest and medication Relief from Meds: 6  No family history on file. Social History   Socioeconomic History   Marital status: Single    Spouse name: Not on file   Number of children: Not on file   Years of education: Not on file   Highest education level: Not on file  Occupational History   Not on file  Tobacco Use   Smoking status: Never   Smokeless tobacco: Never  Vaping Use   Vaping Use: Never used  Substance and Sexual Activity   Alcohol use: No   Drug use: No   Sexual activity: Never    Birth control/protection: Post-menopausal  Other Topics Concern   Not on file  Social History Narrative   Not on file   Social Determinants of Health   Financial Resource Strain: Not on file  Food Insecurity: Not on file  Transportation Needs: Not on file  Physical Activity: Not on file  Stress: Not on file  Social Connections: Not on file   Past Surgical History:  Procedure Laterality Date   ADENOIDECTOMY     CHOLECYSTECTOMY      EYE SURGERY     HIP ARTHROPLASTY Left 04/08/2014   Procedure: LEFT HIP HEMIARTHROPLASTY;  Surgeon: Verlee Rossetti, MD;  Location: [facility] OR;  Service: Orthopedics;  Laterality: Left;   left cataract removed     TONSILLECTOMY     Past Surgical History:  Procedure Laterality Date   ADENOIDECTOMY     CHOLECYSTECTOMY     EYE SURGERY     HIP ARTHROPLASTY Left 04/08/2014   Procedure: LEFT HIP HEMIARTHROPLASTY;  Surgeon: Verlee Rossetti, MD;  Location: [facility] OR;  Service: Orthopedics;  Laterality: Left;   left cataract removed     TONSILLECTOMY     Past Medical History:  Diagnosis Date   Cataract    left eye   Chronic back pain    stenosis   Constipation    takes an OTC stool softener    Diabetes mellitus without complication (HCC)    takes Lantus daily   GERD (gastroesophageal reflux disease)    not on Protonix daily   History of bronchitis 20 yrs ago   History of lupus 46yrs ago   History of shingles    HTN (hypertension) 04/07/2014   Hyperlipidemia 04/07/2014   takes Lovastatin daily   Hypertension    takes Amlodipine and Atenolol daily   Hypothyroidism 04/07/2014   takes Synthroid daily   Joint  pain    Nocturia    OA (osteoarthritis) 04/07/2014   Peripheral edema    Urinary frequency    takes Vesicare daily   UTI (lower urinary tract infection)    completed antibioitcs on 07/28/14   There were no vitals taken for this visit.  Opioid Risk Score:   Fall Risk Score:  `1  Depression screen [facility] 2/9     02/14/2023    2:31 PM 08/02/2022    9:26 AM 02/01/2022   11:35 AM 07/27/2021   11:06 AM 01/19/2021   10:41 AM 07/10/2018   10:26 AM 11/19/2017    9:59 AM  Depression screen PHQ 2/9  Decreased Interest 0 0 0 0 0 1 0  Down, Depressed, Hopeless 0 0 0 0 0 1 0  PHQ - 2 Score 0 0 0 0 0 2 0    Review of Systems  Musculoskeletal:  Positive for back pain and gait problem.  All other systems reviewed and are negative.     Objective:   Physical Exam Vitals and nursing note reviewed.   Constitutional:      Appearance: Normal appearance.  Cardiovascular:     Rate and Rhythm: Normal rate and regular rhythm.     Pulses: Normal pulses.     Heart sounds: Normal heart sounds.  Pulmonary:     Effort: Pulmonary effort is normal.     Breath sounds: Normal breath sounds.  Musculoskeletal:     Cervical back: Normal range of motion and neck supple.     Comments: Normal Muscle Bulk and Muscle Testing Reveals:  Upper Extremities: Right: Decreased ROM 20 Degrees  and Muscle Strength 5/5 Left Upper Extremity: Decreased ROM 30 Degrees and Muscle Strength 5/5 Left Greater Trochanter Tenderness Lower Extremities: Full ROM and Muscle Strength 5/5 Arrived in wheelchair      Skin:    General: Skin is warm and dry.  Neurological:     Mental Status: She is alert and oriented to person, place, and time.  Psychiatric:        Mood and Affect: Mood normal.        Behavior: Behavior normal.         Assessment & Plan:  1. Left Lumbar 3 radiculopathy/stenosis s/p L3-4 laminectomy and fusion: Refilled:  Oxycodone 10 mg one tablet every 8 hours as needed # 70. Second script sent for the following month.  We will continue the opioid monitoring program, this consists of regular clinic visits, examinations, urine drug screen, pill counts as well as use of [place] Controlled Substance reporting System.  Continue  using walker for balance and stability. 04/16/2023 2. Diabetes with peripheral neuropathy: Continue with Lidoderm and Voltaren Gel. 3. Left Hip Pain: S/P on 04/08/2014: LEFT HIP HEMIARTHROPLASTY by Dr Ranell Patrick. Continue HEP as Tolerated. Continue to Monitor.     F/U in 1 month

## 2023-04-20 LAB — DRUG TOX MONITOR 1 W/CONF, ORAL FLD
Amphetamines: NEGATIVE ng/mL (ref <10)
Barbiturates: NEGATIVE ng/mL (ref <10)
Benzodiazepines: NEGATIVE ng/mL (ref <0.50)
Buprenorphine: NEGATIVE ng/mL (ref <0.10)
Cocaine: NEGATIVE ng/mL (ref <5.0)
Codeine: NEGATIVE ng/mL (ref <2.5)
Dihydrocodeine: NEGATIVE ng/mL (ref <2.5)
Fentanyl: NEGATIVE ng/mL (ref <0.10)
Heroin Metabolite: NEGATIVE ng/mL (ref <1.0)
Hydrocodone: NEGATIVE ng/mL (ref <2.5)
Hydromorphone: NEGATIVE ng/mL (ref <2.5)
MARIJUANA: NEGATIVE ng/mL (ref <2.5)
MDMA: NEGATIVE ng/mL (ref <10)
Meprobamate: NEGATIVE ng/mL (ref <2.5)
Methadone: NEGATIVE ng/mL (ref <5.0)
Morphine: NEGATIVE ng/mL (ref <2.5)
Nicotine Metabolite: NEGATIVE ng/mL (ref <5.0)
Norhydrocodone: NEGATIVE ng/mL (ref <2.5)
Noroxycodone: 12.4 ng/mL — ABNORMAL HIGH (ref <2.5)
Opiates: POSITIVE ng/mL — AB (ref <2.5)
Oxycodone: 224.3 ng/mL — ABNORMAL HIGH (ref <2.5)
Oxymorphone: NEGATIVE ng/mL (ref <2.5)
Phencyclidine: NEGATIVE ng/mL (ref <10)
Tapentadol: NEGATIVE ng/mL (ref <5.0)
Tramadol: NEGATIVE ng/mL (ref <5.0)
Zolpidem: NEGATIVE ng/mL (ref <5.0)

## 2023-04-20 LAB — DRUG TOX ALC METAB W/CON, ORAL FLD: Alcohol Metabolite: NEGATIVE ng/mL (ref <25)

## 2023-05-15 NOTE — Telephone Encounter (Signed)
error 

## 2023-05-16 ENCOUNTER — Telehealth: Payer: Self-pay | Admitting: *Deleted

## 2023-05-16 NOTE — Telephone Encounter (Signed)
Oral swab drug screen was consistent for prescribed medications.  ?

## 2023-06-21 ENCOUNTER — Encounter: Payer: Self-pay | Admitting: Registered Nurse

## 2023-06-21 ENCOUNTER — Telehealth: Payer: Self-pay | Admitting: Registered Nurse

## 2023-06-21 DIAGNOSIS — M898X9 Other specified disorders of bone, unspecified site: Secondary | ICD-10-CM

## 2023-06-21 DIAGNOSIS — G894 Chronic pain syndrome: Secondary | ICD-10-CM

## 2023-06-21 DIAGNOSIS — S72002S Fracture of unspecified part of neck of left femur, sequela: Secondary | ICD-10-CM

## 2023-06-21 MED ORDER — OXYCODONE HCL 10 MG PO TABS
10.0000 mg | ORAL_TABLET | Freq: Three times a day (TID) | ORAL | 0 refills | Status: AC | PRN
Start: 2023-06-21 — End: ?

## 2023-06-21 NOTE — Telephone Encounter (Signed)
PMP was Reviewed Oxycodone e-scribed to pharmacy  Call placed to Ms. Smith, no answer, left message to return the call.  Please change her appointment to 07/16/23, to accommodate schedule medication regimen.

## 2023-06-21 NOTE — Addendum Note (Signed)
Addended by: Jones Bales on: 06/21/2023 11:25 AM   Modules accepted: Orders

## 2023-06-21 NOTE — Telephone Encounter (Signed)
Daughter called to reschedule patients appointment , she is unable to bring her in and patient will run out of her medication today . Patient needs a refill on oxycodone

## 2023-06-26 ENCOUNTER — Ambulatory Visit: Payer: Self-pay | Admitting: Registered Nurse

## 2023-07-06 ENCOUNTER — Telehealth: Payer: Self-pay

## 2023-07-06 NOTE — Telephone Encounter (Addendum)
Verda Cumins RN with Dr. Leandrew Koyanagi office [phone]) at [facility] called:   Mrs. Mellott has requested Dr. Leandrew Koyanagi her PCP to take over prescribing the Oxycodone 10 MG. Because the office is closer to home.   Dr. Leandrew Koyanagi is willing to take prescribing the pain medication.   Please advise is needed.

## 2023-07-10 ENCOUNTER — Other Ambulatory Visit: Payer: Self-pay

## 2023-07-10 NOTE — Telephone Encounter (Signed)
Dr. Cato Mulligan office has been informed of Dr.Swartz reply.

## 2023-07-16 ENCOUNTER — Ambulatory Visit: Payer: Self-pay | Admitting: Registered Nurse

## 2023-09-26 DIAGNOSIS — I10 Essential (primary) hypertension: Secondary | ICD-10-CM | POA: Diagnosis not present

## 2023-09-26 DIAGNOSIS — N1831 Chronic kidney disease, stage 3a: Secondary | ICD-10-CM | POA: Diagnosis not present

## 2023-09-26 DIAGNOSIS — F1120 Opioid dependence, uncomplicated: Secondary | ICD-10-CM | POA: Diagnosis not present

## 2023-09-26 DIAGNOSIS — E7849 Other hyperlipidemia: Secondary | ICD-10-CM | POA: Diagnosis not present

## 2023-09-26 DIAGNOSIS — E1122 Type 2 diabetes mellitus with diabetic chronic kidney disease: Secondary | ICD-10-CM | POA: Diagnosis not present

## 2023-09-26 DIAGNOSIS — E039 Hypothyroidism, unspecified: Secondary | ICD-10-CM | POA: Diagnosis not present

## 2023-10-02 DIAGNOSIS — N1832 Chronic kidney disease, stage 3b: Secondary | ICD-10-CM | POA: Diagnosis not present

## 2023-10-02 DIAGNOSIS — R300 Dysuria: Secondary | ICD-10-CM | POA: Diagnosis not present

## 2023-10-02 DIAGNOSIS — E1122 Type 2 diabetes mellitus with diabetic chronic kidney disease: Secondary | ICD-10-CM | POA: Diagnosis not present

## 2023-10-02 DIAGNOSIS — M48062 Spinal stenosis, lumbar region with neurogenic claudication: Secondary | ICD-10-CM | POA: Diagnosis not present

## 2023-10-02 DIAGNOSIS — Z23 Encounter for immunization: Secondary | ICD-10-CM | POA: Diagnosis not present

## 2023-10-02 DIAGNOSIS — F1120 Opioid dependence, uncomplicated: Secondary | ICD-10-CM | POA: Diagnosis not present

## 2023-10-02 DIAGNOSIS — I10 Essential (primary) hypertension: Secondary | ICD-10-CM | POA: Diagnosis not present

## 2023-10-02 DIAGNOSIS — E039 Hypothyroidism, unspecified: Secondary | ICD-10-CM | POA: Diagnosis not present

## 2023-10-02 DIAGNOSIS — E7849 Other hyperlipidemia: Secondary | ICD-10-CM | POA: Diagnosis not present

## 2023-10-02 DIAGNOSIS — N390 Urinary tract infection, site not specified: Secondary | ICD-10-CM | POA: Diagnosis not present

## 2023-10-10 ENCOUNTER — Ambulatory Visit: Payer: Medicare HMO | Admitting: Physical Medicine & Rehabilitation

## 2023-11-25 DEATH — deceased
